# Patient Record
Sex: Male | Born: 1970 | Race: Black or African American | Hispanic: No | Marital: Married | Smoking: Never smoker
Health system: Southern US, Community
[De-identification: ages and names within clinical notes are randomized; demographics above are authoritative.]

## PROBLEM LIST (undated history)

## (undated) DIAGNOSIS — E669 Obesity, unspecified: Secondary | ICD-10-CM

## (undated) DIAGNOSIS — F419 Anxiety disorder, unspecified: Secondary | ICD-10-CM

## (undated) DIAGNOSIS — T7840XA Allergy, unspecified, initial encounter: Secondary | ICD-10-CM

## (undated) DIAGNOSIS — Z973 Presence of spectacles and contact lenses: Secondary | ICD-10-CM

## (undated) DIAGNOSIS — F329 Major depressive disorder, single episode, unspecified: Secondary | ICD-10-CM

## (undated) DIAGNOSIS — Z8669 Personal history of other diseases of the nervous system and sense organs: Secondary | ICD-10-CM

## (undated) DIAGNOSIS — Z9289 Personal history of other medical treatment: Secondary | ICD-10-CM

## (undated) DIAGNOSIS — L659 Nonscarring hair loss, unspecified: Secondary | ICD-10-CM

## (undated) DIAGNOSIS — L309 Dermatitis, unspecified: Secondary | ICD-10-CM

## (undated) DIAGNOSIS — G709 Myoneural disorder, unspecified: Secondary | ICD-10-CM

## (undated) HISTORY — PX: TONSILLECTOMY AND ADENOIDECTOMY: SUR1326

## (undated) HISTORY — DX: Dermatitis, unspecified: L30.9

## (undated) HISTORY — DX: Major depressive disorder, single episode, unspecified: F32.9

## (undated) HISTORY — DX: Nonscarring hair loss, unspecified: L65.9

## (undated) HISTORY — DX: Obesity, unspecified: E66.9

## (undated) HISTORY — DX: Personal history of other diseases of the nervous system and sense organs: Z86.69

## (undated) HISTORY — DX: Myoneural disorder, unspecified: G70.9

## (undated) HISTORY — DX: Allergy, unspecified, initial encounter: T78.40XA

## (undated) HISTORY — DX: Presence of spectacles and contact lenses: Z97.3

## (undated) HISTORY — DX: Anxiety disorder, unspecified: F41.9

## (undated) HISTORY — DX: Personal history of other medical treatment: Z92.89

## (undated) HISTORY — PX: TONSILLECTOMY: SUR1361

---

## 2002-05-17 ENCOUNTER — Encounter: Payer: Self-pay | Admitting: Emergency Medicine

## 2002-05-17 ENCOUNTER — Encounter: Admission: RE | Admit: 2002-05-17 | Discharge: 2002-05-17 | Payer: Self-pay | Admitting: Emergency Medicine

## 2002-06-14 ENCOUNTER — Ambulatory Visit (HOSPITAL_COMMUNITY): Admission: RE | Admit: 2002-06-14 | Discharge: 2002-06-14 | Payer: Self-pay | Admitting: Emergency Medicine

## 2002-06-14 ENCOUNTER — Encounter: Payer: Self-pay | Admitting: Emergency Medicine

## 2003-10-14 ENCOUNTER — Encounter: Admission: RE | Admit: 2003-10-14 | Discharge: 2003-10-14 | Payer: Self-pay | Admitting: Emergency Medicine

## 2003-10-25 ENCOUNTER — Encounter: Admission: RE | Admit: 2003-10-25 | Discharge: 2003-10-25 | Payer: Self-pay | Admitting: Emergency Medicine

## 2006-10-14 ENCOUNTER — Encounter: Admission: RE | Admit: 2006-10-14 | Discharge: 2006-10-14 | Payer: Self-pay | Admitting: Emergency Medicine

## 2007-04-28 DIAGNOSIS — Z9289 Personal history of other medical treatment: Secondary | ICD-10-CM

## 2007-04-28 HISTORY — DX: Personal history of other medical treatment: Z92.89

## 2009-07-23 ENCOUNTER — Ambulatory Visit (HOSPITAL_COMMUNITY): Admission: RE | Admit: 2009-07-23 | Discharge: 2009-07-23 | Payer: Self-pay | Admitting: Gastroenterology

## 2010-04-27 HISTORY — PX: COLONOSCOPY: SHX174

## 2010-12-05 ENCOUNTER — Emergency Department (HOSPITAL_COMMUNITY)
Admission: EM | Admit: 2010-12-05 | Discharge: 2010-12-05 | Disposition: A | Discharge status: Home / Self Care | Payer: 59 | Attending: Emergency Medicine | Admitting: Emergency Medicine

## 2010-12-05 ENCOUNTER — Emergency Department (HOSPITAL_COMMUNITY): Payer: 59

## 2010-12-05 DIAGNOSIS — E7800 Pure hypercholesterolemia, unspecified: Secondary | ICD-10-CM | POA: Insufficient documentation

## 2010-12-05 DIAGNOSIS — R059 Cough, unspecified: Secondary | ICD-10-CM | POA: Insufficient documentation

## 2010-12-05 DIAGNOSIS — R05 Cough: Secondary | ICD-10-CM | POA: Insufficient documentation

## 2010-12-05 DIAGNOSIS — R0602 Shortness of breath: Secondary | ICD-10-CM | POA: Insufficient documentation

## 2010-12-05 DIAGNOSIS — R110 Nausea: Secondary | ICD-10-CM | POA: Insufficient documentation

## 2010-12-05 DIAGNOSIS — R0789 Other chest pain: Secondary | ICD-10-CM | POA: Insufficient documentation

## 2010-12-05 LAB — URINALYSIS, ROUTINE W REFLEX MICROSCOPIC
Bilirubin Urine: NEGATIVE
Glucose, UA: NEGATIVE mg/dL
Hgb urine dipstick: NEGATIVE
Ketones, ur: NEGATIVE mg/dL
Leukocytes, UA: NEGATIVE
Nitrite: NEGATIVE
Protein, ur: NEGATIVE mg/dL
Specific Gravity, Urine: 1.008 (ref 1.005–1.030)
Urobilinogen, UA: 0.2 mg/dL (ref 0.0–1.0)
pH: 7 (ref 5.0–8.0)

## 2011-06-15 ENCOUNTER — Other Ambulatory Visit: Payer: Self-pay | Admitting: Family Medicine

## 2011-06-15 ENCOUNTER — Ambulatory Visit
Admission: RE | Admit: 2011-06-15 | Discharge: 2011-06-15 | Disposition: A | Discharge status: Home / Self Care | Payer: 59 | Source: Ambulatory Visit | Attending: Family Medicine | Admitting: Family Medicine

## 2011-06-15 DIAGNOSIS — R059 Cough, unspecified: Secondary | ICD-10-CM

## 2011-06-15 DIAGNOSIS — R05 Cough: Secondary | ICD-10-CM

## 2012-04-27 DIAGNOSIS — Z8669 Personal history of other diseases of the nervous system and sense organs: Secondary | ICD-10-CM

## 2012-04-27 HISTORY — DX: Personal history of other diseases of the nervous system and sense organs: Z86.69

## 2012-10-23 ENCOUNTER — Encounter (HOSPITAL_COMMUNITY): Payer: Self-pay | Admitting: *Deleted

## 2012-10-23 ENCOUNTER — Emergency Department (HOSPITAL_COMMUNITY): Payer: 59

## 2012-10-23 ENCOUNTER — Emergency Department (HOSPITAL_COMMUNITY)
Admission: EM | Admit: 2012-10-23 | Discharge: 2012-10-23 | Disposition: A | Discharge status: Home / Self Care | Payer: 59 | Attending: Emergency Medicine | Admitting: Emergency Medicine

## 2012-10-23 DIAGNOSIS — J029 Acute pharyngitis, unspecified: Secondary | ICD-10-CM | POA: Insufficient documentation

## 2012-10-23 DIAGNOSIS — R599 Enlarged lymph nodes, unspecified: Secondary | ICD-10-CM | POA: Insufficient documentation

## 2012-10-23 DIAGNOSIS — J209 Acute bronchitis, unspecified: Secondary | ICD-10-CM

## 2012-10-23 DIAGNOSIS — R059 Cough, unspecified: Secondary | ICD-10-CM | POA: Insufficient documentation

## 2012-10-23 DIAGNOSIS — R05 Cough: Secondary | ICD-10-CM | POA: Insufficient documentation

## 2012-10-23 DIAGNOSIS — Z8669 Personal history of other diseases of the nervous system and sense organs: Secondary | ICD-10-CM | POA: Insufficient documentation

## 2012-10-23 LAB — RAPID STREP SCREEN (MED CTR MEBANE ONLY): Streptococcus, Group A Screen (Direct): NEGATIVE

## 2012-10-23 MED ORDER — METHYLPREDNISOLONE 4 MG PO KIT: PACK | ORAL | Status: DC

## 2012-10-23 MED ORDER — GI COCKTAIL ~~LOC~~
30.0000 mL | Freq: Once | ORAL | Status: AC
  Administered 2012-10-23: 30 mL via ORAL
  Filled 2012-10-23: qty 30

## 2012-10-23 MED ORDER — ALBUTEROL SULFATE HFA 108 (90 BASE) MCG/ACT IN AERS: 2.0000 | INHALATION_SPRAY | RESPIRATORY_TRACT | Status: DC | PRN

## 2012-10-23 MED ORDER — LIDOCAINE VISCOUS 2 % MT SOLN: 15.0000 mL | OROMUCOSAL | Status: DC | PRN

## 2012-10-23 MED ORDER — AZITHROMYCIN 250 MG PO TABS: ORAL_TABLET | ORAL | Status: DC

## 2012-10-23 NOTE — ED Notes (Signed)
MD at bedside. 

## 2012-10-23 NOTE — ED Provider Notes (Signed)
History    CSN: [id number] Arrival date & time 10/23/12  2103  First MD Initiated Contact with Patient 10/23/12 2126     Chief Complaint  Patient presents with  . Sore Throat  . Wheezing  . Cough   (Consider location/radiation/quality/duration/timing/severity/associated sxs/prior Treatment) HPI  Devin Erickson is a 42 y.o. male complaining of sore throat, dry cough and wheezing but is exacerbated when supine. Patient denies fever, nausea vomiting, rhinorrhea, shortness of breath, chest pain, change in bowel or bladder habits. She was seen by primary care and given Vicodin with little relief.  Past Medical History  Diagnosis Date  . Sleep apnea    History reviewed. No pertinent past surgical history. No family history on file. History  Substance Use Topics  . Smoking status: Never Smoker   . Smokeless tobacco: Not on file  . Alcohol Use: Yes    Review of Systems  Constitutional:       Negative except as described in HPI  HENT:       Negative except as described in HPI  Respiratory:       Negative except as described in HPI  Cardiovascular:       Negative except as described in HPI  Gastrointestinal:       Negative except as described in HPI  Genitourinary:       Negative except as described in HPI  Musculoskeletal:       Negative except as described in HPI  Skin:       Negative except as described in HPI  Neurological:       Negative except as described in HPI  All other systems reviewed and are negative.    Allergies  Review of patient's allergies indicates no known allergies.  Home Medications   Current Outpatient Rx  Name  Route  Sig  Dispense  Refill  . HYDROcodone-homatropine (HYCODAN) 5-1.5 MG/5ML syrup   Oral   Take 10 mLs by mouth every 6 (six) hours as needed for cough.          BP 130/82  Pulse 78  Temp(Src) 98.1 F (36.7 C) (Oral)  Resp 18  Ht 6\' 3"  (1.905 m)  Wt 285 lb (129.275 kg)  BMI 35.62 kg/m2  SpO2 97% Physical Exam   Nursing note and vitals reviewed. Constitutional: He is oriented to person, place, and time. He appears well-developed and well-nourished. No distress.  HENT:  Head: Normocephalic.  Mouth/Throat: Oropharynx is clear and moist. No oropharyngeal exudate.  Only erythematous posterior pharynx, tonsillar hypertrophy 1+ bilaterally  Eyes: Conjunctivae and EOM are normal. Pupils are equal, round, and reactive to light.  Neck:  Tender anterior cervical lymphadenopathy  Cardiovascular: Normal rate, regular rhythm and intact distal pulses.   Pulmonary/Chest: Effort normal and breath sounds normal. No stridor. No respiratory distress. He has no wheezes. He has no rales. He exhibits no tenderness.  Abdominal: Soft. Bowel sounds are normal. He exhibits no distension and no mass. There is no tenderness. There is no rebound and no guarding.  Musculoskeletal: Normal range of motion.  Lymphadenopathy:    He has cervical adenopathy.  Neurological: He is alert and oriented to person, place, and time.  Psychiatric: He has a normal mood and affect.    ED Course  Procedures (including critical care time) Labs Reviewed  RAPID STREP SCREEN   Dg Chest 2 View  10/23/2012   *RADIOLOGY REPORT*  Clinical Data: Cough for 1 week.  CHEST - 2 VIEW  Comparison:  PA and lateral chest 06/15/2011.  Findings: Lungs are clear.  Heart size is normal.  No pneumothorax or pleural fluid.  IMPRESSION: Negative chest.   Original Report Authenticated By: Holley Dexter, M.D.   1. Acute bronchitis     MDM   Filed Vitals:   10/23/12 2116  BP: 130/82  Pulse: 78  Temp: 98.1 F (36.7 C)  TempSrc: Oral  Resp: 18  Height: 6\' 3"  (1.905 m)  Weight: 285 lb (129.275 kg)  SpO2: 97%     Devin Erickson is a 42 y.o. male thing of sore throat, cough and wheezing worsening over the course of last week. Afebrile, saturating well on room air, rapid strep and chest x-ray pending.  Chest x-ray shows no infiltrate, rapid strep is  negative, reassuring physical exam. I will treat the patient aggressively for acute bronchitis at patient's and family's request.    Medications  gi cocktail (Maalox,Lidocaine,Donnatal) (30 mLs Oral Given 10/23/12 2213)    Pt is hemodynamically stable, appropriate for, and amenable to discharge at this time. Pt verbalized understanding and agrees with care plan. Outpatient follow-up and specific return precautions discussed.    New Prescriptions   ALBUTEROL (PROVENTIL HFA;VENTOLIN HFA) 108 (90 BASE) MCG/ACT INHALER    Inhale 2 puffs into the lungs every 2 (two) hours as needed for wheezing or shortness of breath (cough).   AZITHROMYCIN (ZITHROMAX Z-PAK) 250 MG TABLET    2 po day one, then 1 daily x 4 days   LIDOCAINE (XYLOCAINE) 2 % SOLUTION    Take 15 mLs by mouth every 3 (three) hours as needed for pain.   METHYLPREDNISOLONE (MEDROL DOSEPAK) 4 MG TABLET    As directed by package insert     Wynetta Emery, PA-C 10/23/12 2309

## 2012-10-23 NOTE — ED Notes (Signed)
Pt states he feels like he can not swallow, throat feels swollen, pt states he has had a cough and wheezing as well as sore throat. Seen by PMD Thursday, given cough syrup.

## 2012-10-24 NOTE — ED Provider Notes (Signed)
Medical screening examination/treatment/procedure(s) were performed by non-physician practitioner and as supervising physician I was immediately available for consultation/collaboration.  Flint Melter, MD 10/24/12 1640

## 2012-10-25 LAB — CULTURE, GROUP A STREP

## 2012-12-02 ENCOUNTER — Ambulatory Visit (HOSPITAL_COMMUNITY)
Admission: RE | Admit: 2012-12-02 | Discharge: 2012-12-02 | Disposition: A | Discharge status: Home / Self Care | Payer: 59 | Source: Ambulatory Visit | Attending: Medical | Admitting: Medical

## 2012-12-02 ENCOUNTER — Ambulatory Visit (INDEPENDENT_AMBULATORY_CARE_PROVIDER_SITE_OTHER): Payer: 59 | Admitting: Medical

## 2012-12-02 ENCOUNTER — Other Ambulatory Visit: Payer: Self-pay | Admitting: Medical

## 2012-12-02 ENCOUNTER — Ambulatory Visit
Admission: RE | Admit: 2012-12-02 | Discharge: 2012-12-02 | Disposition: A | Discharge status: Home / Self Care | Payer: 59 | Source: Ambulatory Visit | Attending: Medical | Admitting: Medical

## 2012-12-02 ENCOUNTER — Encounter: Payer: Self-pay | Admitting: Medical

## 2012-12-02 VITALS — BP 118/78 | HR 95 | Temp 98.7°F | Resp 18 | Ht 75.0 in | Wt 293.0 lb

## 2012-12-02 DIAGNOSIS — R0781 Pleurodynia: Secondary | ICD-10-CM

## 2012-12-02 DIAGNOSIS — R0989 Other specified symptoms and signs involving the circulatory and respiratory systems: Secondary | ICD-10-CM

## 2012-12-02 DIAGNOSIS — R0600 Dyspnea, unspecified: Secondary | ICD-10-CM

## 2012-12-02 DIAGNOSIS — R0602 Shortness of breath: Secondary | ICD-10-CM

## 2012-12-02 DIAGNOSIS — R7989 Other specified abnormal findings of blood chemistry: Secondary | ICD-10-CM

## 2012-12-02 DIAGNOSIS — R0609 Other forms of dyspnea: Secondary | ICD-10-CM

## 2012-12-02 DIAGNOSIS — R071 Chest pain on breathing: Secondary | ICD-10-CM

## 2012-12-02 DIAGNOSIS — J189 Pneumonia, unspecified organism: Secondary | ICD-10-CM | POA: Insufficient documentation

## 2012-12-02 LAB — BASIC METABOLIC PANEL
BUN: 8 mg/dL (ref 6–23)
CO2: 29 mEq/L (ref 19–32)
Calcium: 9.3 mg/dL (ref 8.4–10.5)
Chloride: 106 mEq/L (ref 96–112)
Creat: 0.99 mg/dL (ref 0.50–1.35)
Glucose, Bld: 101 mg/dL — ABNORMAL HIGH (ref 70–99)
Potassium: 3.5 mEq/L (ref 3.5–5.3)
Sodium: 140 mEq/L (ref 135–145)

## 2012-12-02 LAB — CBC WITH DIFFERENTIAL/PLATELET
Basophils Absolute: 0 10*3/uL (ref 0.0–0.1)
Basophils Relative: 0 % (ref 0–1)
Eosinophils Absolute: 1.2 10*3/uL — ABNORMAL HIGH (ref 0.0–0.7)
Eosinophils Relative: 15 % — ABNORMAL HIGH (ref 0–5)
HCT: 38.5 % — ABNORMAL LOW (ref 39.0–52.0)
Hemoglobin: 13.8 g/dL (ref 13.0–17.0)
Lymphocytes Relative: 21 % (ref 12–46)
Lymphs Abs: 1.7 10*3/uL (ref 0.7–4.0)
MCH: 24.7 pg — ABNORMAL LOW (ref 26.0–34.0)
MCHC: 35.8 g/dL (ref 30.0–36.0)
MCV: 68.9 fL — ABNORMAL LOW (ref 78.0–100.0)
Monocytes Absolute: 0.7 10*3/uL (ref 0.1–1.0)
Monocytes Relative: 9 % (ref 3–12)
Neutro Abs: 4.5 10*3/uL (ref 1.7–7.7)
Neutrophils Relative %: 55 % (ref 43–77)
Platelets: 185 10*3/uL (ref 150–400)
RBC: 5.59 MIL/uL (ref 4.22–5.81)
RDW: 17.2 % — ABNORMAL HIGH (ref 11.5–15.5)
WBC: 8.1 10*3/uL (ref 4.0–10.5)

## 2012-12-02 LAB — D-DIMER, QUANTITATIVE: D-Dimer, Quant: 1.39 ug/mL-FEU — ABNORMAL HIGH (ref 0.00–0.48)

## 2012-12-02 LAB — BRAIN NATRIURETIC PEPTIDE: Brain Natriuretic Peptide: <2 pg/mL (ref 0.0–100.0)

## 2012-12-02 MED ORDER — METHYLPREDNISOLONE 4 MG PO KIT: PACK | ORAL | Status: DC

## 2012-12-02 MED ORDER — LEVOFLOXACIN 500 MG PO TABS: 500.0000 mg | ORAL_TABLET | Freq: Every day | ORAL | Status: DC

## 2012-12-02 MED ORDER — ALBUTEROL SULFATE HFA 108 (90 BASE) MCG/ACT IN AERS: 2.0000 | INHALATION_SPRAY | RESPIRATORY_TRACT | Status: DC | PRN

## 2012-12-02 MED ORDER — IOHEXOL 350 MG/ML SOLN
100.0000 mL | Freq: Once | INTRAVENOUS | Status: AC | PRN
  Administered 2012-12-02: 100 mL via INTRAVENOUS

## 2012-12-02 NOTE — Progress Notes (Signed)
Subjective:  Devin Erickson is a 42 y.o. male new patient accompanied by wife and son who presents for possible bronchitis or pneumonia.  He notes having frequent respiratory infections and a few other episodes of bronchitis/pneumonia in the last several months.  Current symptoms include wheezing, dyspnea, hurts to breath, pain with breathing and coughing, coughing non stop.  Thinks he pulled muscle in chest due to coughing.   Feels feverish last few nights.   Has had sweats, chills, nonproductive cough though.  Feels heavy chest congestion.  Feels like water in chest.   He does note sore throat, head pressure, headache.  Denies runny nose, ear pain.  +nausea.  Denies vomiting and diarrhea.  Treatment to date:OTC Tussin.   Not sleeping well due to cough.  No sick contacts.  Non smoker.   No hx/o asthma or COPD.  He notes 2-3 respiratory infections in the last year.  Thinks he is getting frequent respiratory infections.  No hx/o heart disease in self or family. No recent trauma, surgery, no hx/o DVT/PE, no recent stasis or long travel.  Incidentally, he does report some ache in left calve.  No other aggravating or relieving factors.  No other c/o.  The following portions of the patient's history were reviewed and updated as appropriate: allergies, current medications, past family history, past medical history, past social history, past surgical history and problem list.  Past Medical History  Diagnosis Date  . Sleep apnea   . Thalassemia minor   . Wears glasses   . Eczema   . Hair loss   . Bipolar disorder   . Depression   . Anxiety     Objective: Vital signs reviewed  General appearance: Alert, WD/WN, no distress                             Skin: warm, no rash, no diaphoresis                           Head: +frontal and maxillary sinus tenderness                            Eyes: conjunctiva normal, corneas clear, PERRLA                            Ears: pearly TMs, external ear canals  normal                          Nose: septum midline, turbinates without erythema and no discharge             Mouth/throat: MMM, tongue normal,no pharyngeal erythema                           Neck: supple, no adenopathy, no thyromegaly, nontender, no JVD                          Heart: RRR, normal S1, S2, no murmurs                         Lungs: audible faint wheezes, patient is noting pain causing him to not take good breaths, so lung sounds seem clear but effort less than desirable, no  obvious rales                Extremities: no edema, nontender Legs: mild tenderness left upper calve, no obvious erythema, warmth, or cord.     Adult ECG Report  Indication: SOB, pleuritic CP  Rate: 90 bpm  Rhythm: normal sinus rhythm  QRS Axis: 75 degrees  PR Interval:  QRS Duration:  QTc:  Conduction Disturbances: none  Other Abnormalities: none  Patient's cardiac risk factors are: male gender.  EKG comparison: none  Narrative Interpretation: normal EKG    Assessment and Plan: Encounter Diagnoses  Name Primary?  . SOB (shortness of breath) Yes  . Dyspnea   . Pleuritic chest pain    Possible bronchitis, but etiology unclear.   Discussed possible causes, possible next steps pending findings.  EKG reviewed, will send for CXR, stat labs sent.   F/u pending call back on studies, if worse in the meantime, go to the ED.

## 2012-12-05 ENCOUNTER — Telehealth: Payer: Self-pay | Admitting: Medical

## 2012-12-05 ENCOUNTER — Other Ambulatory Visit: Payer: Self-pay | Admitting: Medical

## 2012-12-05 ENCOUNTER — Ambulatory Visit: Payer: Self-pay | Admitting: Medical

## 2012-12-05 MED ORDER — HYDROCODONE-HOMATROPINE 5-1.5 MG/5ML PO SYRP: 5.0000 mL | ORAL_SOLUTION | Freq: Four times a day (QID) | ORAL | Status: DC | PRN

## 2012-12-05 NOTE — Telephone Encounter (Signed)
1) fax/write work note for illness.  Can cover him Friday, Monday and tomorrow.  Have him f/u tomorrow. 2) take the prednisone in the morning.  If the cough is keeping him up, then I can call out cough syrup that will help with sleep.   Let me know

## 2012-12-05 NOTE — Telephone Encounter (Signed)
Patient says if you could please send him in something for his cough to his pharmacy and he will drop off paper work from his job to be filled out. CLS

## 2012-12-05 NOTE — Progress Notes (Signed)
I called out Hycodan to the patients pharmacy per Crosby Oyster PA-C. CLS

## 2012-12-05 NOTE — Telephone Encounter (Signed)
Please call  Questions about medication, He is not able to sleep. (Did advise him that can be a side effect of the prednisone especially if he is taking later in the day)  Otherwise he is not coughing as much and that is good but he CAN NOT sleep Also, his work requires FMLA forms to be completed anytime he is out of work so he will drop those off

## 2012-12-12 ENCOUNTER — Telehealth: Payer: Self-pay | Admitting: Internal Medicine

## 2012-12-12 ENCOUNTER — Ambulatory Visit (INDEPENDENT_AMBULATORY_CARE_PROVIDER_SITE_OTHER): Payer: 59 | Admitting: Family Medicine

## 2012-12-12 ENCOUNTER — Telehealth: Payer: Self-pay | Admitting: Family Medicine

## 2012-12-12 ENCOUNTER — Encounter: Payer: Self-pay | Admitting: Family Medicine

## 2012-12-12 VITALS — BP 132/86 | HR 92 | Temp 98.5°F | Ht 75.0 in | Wt 293.0 lb

## 2012-12-12 DIAGNOSIS — R059 Cough, unspecified: Secondary | ICD-10-CM

## 2012-12-12 DIAGNOSIS — J189 Pneumonia, unspecified organism: Secondary | ICD-10-CM

## 2012-12-12 DIAGNOSIS — R062 Wheezing: Secondary | ICD-10-CM

## 2012-12-12 DIAGNOSIS — R05 Cough: Secondary | ICD-10-CM

## 2012-12-12 MED ORDER — PREDNISONE 20 MG PO TABS: 20.0000 mg | ORAL_TABLET | Freq: Two times a day (BID) | ORAL | Status: DC

## 2012-12-12 MED ORDER — ALBUTEROL SULFATE HFA 108 (90 BASE) MCG/ACT IN AERS: 2.0000 | INHALATION_SPRAY | RESPIRATORY_TRACT | Status: DC | PRN

## 2012-12-12 MED ORDER — AZITHROMYCIN 250 MG PO TABS
ORAL_TABLET | ORAL | Status: DC
Start: 2012-12-12 — End: 2012-12-19

## 2012-12-12 MED ORDER — BENZONATATE 200 MG PO CAPS: 200.0000 mg | ORAL_CAPSULE | Freq: Three times a day (TID) | ORAL | Status: DC | PRN

## 2012-12-12 NOTE — Telephone Encounter (Signed)
Message copied by Janeice Robinson on Mon Dec 12, 2012 10:15 AM ------      Message from: Jac Canavan      Created: Fri Dec 09, 2012  5:24 PM       Needs f/u visit recheck on breathing, symptoms . Can't complete his FMLA without knowing all dates he missed, and without recheck to know next steps. ------

## 2012-12-12 NOTE — Progress Notes (Signed)
Chief Complaint  Patient presents with  . Follow-up    came in and saw Shane 12/02/12, finished all meds. Still SOB, coughing-worse at night.    Patient presents with ongoing cough, wheezing.  He was seen 8/8 with cough, wheeze, shortness of breath.  He had been having fevers at night, never had discolored phlegm.  He feels like he got a little better, but hasn't continued to improve.  He feels like he is still wheezing, but "not as loud as it was".  His shortness of breath is "much better". He was treated with medrol dosepak, levaquin x 1 week. He reports he is still having tactile fevers at night, wakes up drenched, shivering, just in the mornings.  No problems during the day with fever or chills.  He talks on the phone all day long at work, getting somewhat short of breath when speaking in long sentences, as well as very fatigued.  Inhaler hasn't been helping in the past couple of days, but he thinks it may have run out. He is very concerned about why he keeps getting sick, as apparently he also had bronchitis this year.  Denies runny nose, head congestion, postnasal drainage.  Having dry coughing spells.  Last night coughed for 1.5 hours.  Almost vomited from coughing.  Still coughed even with the hycodan, maybe stopped coughing 45 minutes after taking it.  Past Medical History  Diagnosis Date  . Sleep apnea   . Thalassemia minor   . Wears glasses   . Eczema   . Hair loss   . Bipolar disorder   . Depression   . Anxiety    No past surgical history on file. History   Social History  . Marital Status: Married    Spouse Name: N/A    Number of Children: N/A  . Years of Education: N/A   Occupational History  . Not on file.   Social History Main Topics  . Smoking status: Never Smoker   . Smokeless tobacco: Not on file  . Alcohol Use: No     Comment: few beers a week.maybe 3-4  . Drug Use: No  . Sexual Activity: Not on file   Other Topics Concern  . Not on file   Social History  Narrative  . No narrative on file    Current outpatient prescriptions:albuterol (PROVENTIL HFA;VENTOLIN HFA) 108 (90 BASE) MCG/ACT inhaler, Inhale 2 puffs into the lungs every 2 (two) hours as needed for wheezing or shortness of breath (cough)., Disp: 1 Inhaler, Rfl: 0;  HYDROcodone-homatropine (HYCODAN) 5-1.5 MG/5ML syrup, Take 5 mLs by mouth every 6 (six) hours as needed for cough., Disp: 120 mL, Rfl: 0 lidocaine (XYLOCAINE) 2 % solution, Take 15 mLs by mouth every 3 (three) hours as needed for pain., Disp: 100 mL, Rfl: 0  No Known Allergies  ROS:  Denies nausea, vomiting (almost had post-tussive emesis on one occasion).  Some decrease in appetite, +overall fatigue.  Denies any diarrhea. Having headaches due to the coughing spells. No chest pain, palpitations, or other complaints except as per HPI.  PHYSICAL EXAM: BP 132/86  Pulse 92  Temp(Src) 98.5 F (36.9 C) (Oral)  Ht 6\' 3"  (1.905 m)  Wt 293 lb (132.904 kg)  BMI 36.62 kg/m2  SpO2 96% Well developed, pleasant male, accompanied by his wife and child, in no distress.  He is speaking easily in full sentences.  No coughing noted during visit. HEENT:  PERRL, EOMI, conjunctiva clear.  TM's and EAC's normal. OP and  nasal mucosa clear.  Sinuses nontender Neck:no lymphadenopathy, thyromegaly or mass Heart: regular rate and rhythm without murmur Lungs: Diffuse mild wheezing.  No rales or ronchi.  Air movement improved some after nebulizer treatment, but still had some mild diffuse wheezing. Extremities: no edema Skin: no rash Neuro: alert and oriented.  Cranial nerves intact. Normal gait, strength.  Review of records/data from recent visit: CXR 8/8 normal. CT 8/8:   1. No evidence of central pulmonary embolus; evaluation  significantly suboptimal due to limitations in the timing of the  contrast bolus.  2. Mild left lower lobe pneumonia noted.  ASSESSMENT/PLAN:  Pneumonia - Plan: azithromycin (ZITHROMAX) 250 MG tablet, PR ALBUTEROL  IPRATROP NON-COMP, PR INHAL RX, AIRWAY OBST/DX SPUTUM INDUCT  Wheezing - Plan: predniSONE (DELTASONE) 20 MG tablet, albuterol (PROVENTIL HFA;VENTOLIN HFA) 108 (90 BASE) MCG/ACT inhaler, PR ALBUTEROL IPRATROP NON-COMP, PR INHAL RX, AIRWAY OBST/DX SPUTUM INDUCT  Cough - Plan: benzonatate (TESSALON) 200 MG capsule, PR ALBUTEROL IPRATROP NON-COMP, PR INHAL RX, AIRWAY OBST/DX SPUTUM INDUCT  Given that cough is nonproductive, that he continues to wheeze, will change to Z-pak (vs extending levaquin course).  Refilled albuterol--he had some benefit to nebulizer treatment in office, likely was out of albuterol in inhaler.  Will give additional steroids.  Risks and side effects of meds reviewed.  Will treat with benzonatate given his lack of response to hydrocodone, and that his coughing spells also occur during day--he can take this med during day, and work/drive.  He was asked to return in 1 week for recheck.  He was mentioning FMLA forms--which were not attached to his paperwork today/not available to me.  He was asked to write down which days of work he missed last week, and provide that info to [place], and to f/u with Vincenza Hews next week.  We can definitely put down the dates of visits to the office, and Vincenza Hews can put down whatever recommendations he had given to pt as far as how long he needs to remain out of work.  I do not feel at this point that he needs to remain out of work.  He was speaking comfortably throughout the visit.    I tried to address his concerns regarding why he isn't feeling better,frustration about getting sick yet again after a bout of bronchitis.  We discussed that unlike bronchitis, the fatigue and decrease in energy can take up to a month to return after a pneumonia.  F/u with Vincenza Hews in 1 week, sooner prn worsening shortness of breath.

## 2012-12-12 NOTE — Patient Instructions (Signed)
Take the medications as directed. The steroids are twice daily for 5 days. The antibiotics are two today, then once daily for the next 4 days.  You only take it for 5 days, but it continues to work for another 5 days. The benzonatate is a cough medication that you can use during the day, shouldn't make you tired. I also recommend trying Mucinex (plain or the DM version) for your cough, especially if having chest congestion and/or thick phlegm.  Return in 1 week for recheck.  Check your temperature--it will help [place] know if you are getting better or worse  As discussed, with a diagnosis of pneumonia, it can sometimes take a full month to fully get your energy back.  But your symptoms of cough, shortness of breath should be improving with the antibiotics in the next week.  Pneumonia, Adult Pneumonia is an infection of the lungs.  CAUSES Pneumonia may be caused by bacteria or a virus. Usually, these infections are caused by breathing infectious particles into the lungs (respiratory tract). SYMPTOMS   Cough.  Fever.  Chest pain.  Increased rate of breathing.  Wheezing.  Mucus production. DIAGNOSIS  If you have the common symptoms of pneumonia, your caregiver will typically confirm the diagnosis with a chest X-ray. The X-ray will show an abnormality in the lung (pulmonary infiltrate) if you have pneumonia. Other tests of your blood, urine, or sputum may be done to find the specific cause of your pneumonia. Your caregiver may also do tests (blood gases or pulse oximetry) to see how well your lungs are working. TREATMENT  Some forms of pneumonia may be spread to other people when you cough or sneeze. You may be asked to wear a mask before and during your exam. Pneumonia that is caused by bacteria is treated with antibiotic medicine. Pneumonia that is caused by the influenza virus may be treated with an antiviral medicine. Most other viral infections must run their course. These infections  will not respond to antibiotics.  PREVENTION A pneumococcal shot (vaccine) is available to prevent a common bacterial cause of pneumonia. This is usually suggested for:  People over 59 years old.  Patients on chemotherapy.  People with chronic lung problems, such as bronchitis or emphysema.  People with immune system problems. If you are over 65 or have a high risk condition, you may receive the pneumococcal vaccine if you have not received it before. In some countries, a routine influenza vaccine is also recommended. This vaccine can help prevent some cases of pneumonia.You may be offered the influenza vaccine as part of your care. If you smoke, it is time to quit. You may receive instructions on how to stop smoking. Your caregiver can provide medicines and counseling to help you quit. HOME CARE INSTRUCTIONS   Cough suppressants may be used if you are losing too much rest. However, coughing protects you by clearing your lungs. You should avoid using cough suppressants if you can.  Your caregiver may have prescribed medicine if he or she thinks your pneumonia is caused by a bacteria or influenza. Finish your medicine even if you start to feel better.  Your caregiver may also prescribe an expectorant. This loosens the mucus to be coughed up.  Only take over-the-counter or prescription medicines for pain, discomfort, or fever as directed by your caregiver.  Do not smoke. Smoking is a common cause of bronchitis and can contribute to pneumonia. If you are a smoker and continue to smoke, your cough may last  several weeks after your pneumonia has cleared.  A cold steam vaporizer or humidifier in your room or home may help loosen mucus.  Coughing is often worse at night. Sleeping in a semi-upright position in a recliner or using a couple pillows under your head will help with this.  Get rest as you feel it is needed. Your body will usually let you know when you need to rest. SEEK IMMEDIATE  MEDICAL CARE IF:   Your illness becomes worse. This is especially true if you are elderly or weakened from any other disease.  You cannot control your cough with suppressants and are losing sleep.  You begin coughing up blood.  You develop pain which is getting worse or is uncontrolled with medicines.  You have a fever.  Any of the symptoms which initially brought you in for treatment are getting worse rather than better.  You develop shortness of breath or chest pain. MAKE SURE YOU:   Understand these instructions.  Will watch your condition.  Will get help right away if you are not doing well or get worse. Document Released: 04/13/2005 Document Revised: 07/06/2011 Document Reviewed: 07/03/2010 [facility] Patient Information 2014 [place], [place].

## 2012-12-12 NOTE — Telephone Encounter (Signed)
I called to speak the patient about his FMLA forms that need to be filled out but he would need an OV first before forms can be complete. THe patient states that he is not breathing that well and he needs an OV today so I placed him on the schedule to see Dr. Lynelle Doctor. CLS

## 2012-12-12 NOTE — Telephone Encounter (Signed)
Pt states he was out august 11 and 12th and then today the 18th and then will take a 1/2 day on the 25th to come back in here. This is the dates for his FMLA

## 2012-12-13 ENCOUNTER — Encounter: Payer: Self-pay | Admitting: Family Medicine

## 2012-12-13 NOTE — Telephone Encounter (Signed)
JUST AN FYI WANTED YOU TO SEE THIS BEFORE TOMORROW CHANDRA IS SUPPOSE TO BE HERE THEN BECAUSE I CANT FIND THE FORM

## 2012-12-13 NOTE — Telephone Encounter (Signed)
FMLA IS IN [employer]

## 2012-12-13 NOTE — Telephone Encounter (Signed)
FYI - pls note that I am off this week.  His FMLA form is in Chandra's work area.  He saw Dr.Knapp yesterday in followup.   Its possible I may be in this week, but can't guarantee I'll get to [facility] until Monday unless Dr. Lynelle Doctor wants to finish the forms.

## 2012-12-16 NOTE — Telephone Encounter (Signed)
I put his forms back on your desk with a note from me. The real question is whether this is a condition that warrants FMLA vs just a note for work.  Pneumonia isn't a chronic condition, but will require follow-up visits and some missed work early on.  Let's talk about it Monday (you are seeing him in f/u on Monday).

## 2012-12-19 ENCOUNTER — Ambulatory Visit (INDEPENDENT_AMBULATORY_CARE_PROVIDER_SITE_OTHER): Payer: 59 | Admitting: Medical

## 2012-12-19 ENCOUNTER — Encounter: Payer: Self-pay | Admitting: Medical

## 2012-12-19 VITALS — BP 122/80 | HR 83 | Temp 98.3°F | Resp 16 | Wt 293.0 lb

## 2012-12-19 DIAGNOSIS — J189 Pneumonia, unspecified organism: Secondary | ICD-10-CM

## 2012-12-19 DIAGNOSIS — R0602 Shortness of breath: Secondary | ICD-10-CM

## 2012-12-19 NOTE — Progress Notes (Signed)
Subjective: Here for f/u on pneumonia, SOB.   Saw Dr. Lynelle Doctor here 12/12/12 in follow up but was still wheezing, coughing and SOB.  completed the second round of antibiotic and steroid.  Overall feeling better.  Still getting some wheezing, slight at bedtime.   Sleeps on his side, but elevated on 2 pillows.   Denies frank dyspnea supine though.   Mostly improved, but not back to 100%.  He denies smoking history, but does report significant seasonal allergies.   No hx/o lung disease.  No hx/o asthma.      Objective: BP 122/80  Pulse 83  Temp(Src) 98.3 F (36.8 C) (Oral)  Resp 16  Wt 293 lb (132.904 kg)  BMI 36.62 kg/m2 Gen: WD, WN, nad.  He is speaking easily in full sentences.  No coughing or wheezing noted during visit. HEENT:  PERRL, EOMI, conjunctiva clear.  TM's and EAC's normal. OP and nasal mucosa clear.  Sinuses nontender Neck:no lymphadenopathy, thyromegaly or mass Heart: regular rate and rhythm without murmur Lungs: clear today Extremities: no edema Skin: no rash  Assessment: Encounter Diagnoses  Name Primary?  . Pneumonia Yes  . Shortness of breath    Plan: Mostly improved.   I advised he c/t albuterol inhaler QHS or up to BID the next week, then go to prn use.  He has finished steroid and antibiotics.   Advised that with pneumonia, residual symptoms can hang around.  No other suspected etiology at this time.   Advised if still SOB in 2 weeks, lets get PFTs.  Reviewed the recent labs, CT chest, Xray.  He will return soon for physical.  discussed the need to be aggressive with allergy regimen in spring and fall.

## 2012-12-19 NOTE — Telephone Encounter (Signed)
See other msg

## 2012-12-21 ENCOUNTER — Telehealth: Payer: Self-pay | Admitting: Medical

## 2012-12-21 NOTE — Telephone Encounter (Signed)
I would start with work note to be sent to him or to HR.  If they have an issue with this, then we need to briefly make them aware that this is improper use of FMLA and the note should suffice.   I have seen a big increase in [employer] FMLA requests of late

## 2012-12-21 NOTE — Telephone Encounter (Signed)
Message copied by Ruffin Frederick on Wed Dec 21, 2012  3:14 PM ------      Message from: Jac Canavan      Created: Mon Dec 19, 2012  6:29 PM       Haynes Kerns, please write note/letter for his work for days missed for the recent illness.  See prior msg regarding the 3 dates missed.   He was seen and treated for pneumonia.   This doesn't qualify for FMLA.  I do have his FMLA forms, but don't think this is an appropriate use of the FMLA forms.  ------

## 2012-12-22 NOTE — Telephone Encounter (Signed)
WIFE given note for pt to be out of work & instructions that if HR won't accept to call us back with a name & # of who he talked to

## 2012-12-29 ENCOUNTER — Telehealth: Payer: Self-pay | Admitting: Family Medicine

## 2012-12-29 NOTE — Telephone Encounter (Signed)
I fax over the patients FMLA forms to the number on the forms. CLS

## 2012-12-29 NOTE — Telephone Encounter (Signed)
Message copied by SCALES, Mikki Santee on Thu Dec 29, 2012  4:12 PM ------      Message from: Jac Canavan      Created: Wed Dec 28, 2012  8:24 PM       I went ahead and completed the FMLA.  pls finish the clinic info and fax back ------

## 2013-01-16 ENCOUNTER — Telehealth: Payer: Self-pay | Admitting: Medical

## 2013-01-16 NOTE — Telephone Encounter (Signed)
lm

## 2013-01-20 ENCOUNTER — Telehealth: Payer: Self-pay | Admitting: Medical

## 2013-01-24 ENCOUNTER — Encounter: Payer: Self-pay | Admitting: Medical

## 2013-01-24 ENCOUNTER — Ambulatory Visit (INDEPENDENT_AMBULATORY_CARE_PROVIDER_SITE_OTHER): Payer: 59 | Admitting: Medical

## 2013-01-24 VITALS — BP 102/80 | HR 78 | Temp 98.3°F | Resp 16 | Wt 291.0 lb

## 2013-01-24 DIAGNOSIS — R05 Cough: Secondary | ICD-10-CM

## 2013-01-24 DIAGNOSIS — R0602 Shortness of breath: Secondary | ICD-10-CM

## 2013-01-24 DIAGNOSIS — L659 Nonscarring hair loss, unspecified: Secondary | ICD-10-CM

## 2013-01-24 DIAGNOSIS — R059 Cough, unspecified: Secondary | ICD-10-CM

## 2013-01-24 DIAGNOSIS — R062 Wheezing: Secondary | ICD-10-CM

## 2013-01-24 DIAGNOSIS — J40 Bronchitis, not specified as acute or chronic: Secondary | ICD-10-CM

## 2013-01-24 MED ORDER — MOMETASONE FUROATE 220 MCG/INH IN AEPB: 2.0000 | INHALATION_SPRAY | Freq: Every day | RESPIRATORY_TRACT | Status: DC

## 2013-01-24 MED ORDER — HYDROCOD POLST-CHLORPHEN POLST 10-8 MG/5ML PO LQCR: 5.0000 mL | Freq: Two times a day (BID) | ORAL | Status: DC

## 2013-01-24 NOTE — Progress Notes (Signed)
Subjective: Here for possible bronchitis. Here with wife and son. Having coughing, fatigue, wheezing, SOB with walking.  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  Has had some wheezing at night.  Sunday night and afternoon, coughing.  Non productive cough. Denies runny nose, sneezing, no head congestion.  Having headache from all the coughing.  Had some vertigo last week.  Has had some body aches.  Denies fever, chills, no NVD, no leg swelling, no chest pain, but feels like someone sitting on his chest.  No sick contacts. Using OTC Robitussin or Mucinex.  Hx/o nonsmoker . He does have hx/o allergies and hx/o sinus infection.    Past Medical History  Diagnosis Date  . Sleep apnea   . Thalassemia minor   . Wears glasses   . Eczema   . Hair loss   . Bipolar disorder   . Depression   . Anxiety    ROS as in subjective   Objective: Filed Vitals:   01/24/13 1339  BP: 102/80  Pulse: 78  Temp: 98.3 F (36.8 C)  Resp: 16    General appearance: alert, no distress, WD/WN HEENT: normocephalic, sclerae anicteric, TMs pearly, nares with dry mucoid discharge and mild erythema, pharynx normal Oral cavity: MMM, no lesions Neck: supple, no lymphadenopathy, no thyromegaly, no masses Heart: RRR, normal S1, S2, no murmurs Lungs: few faint wheezes, otherwise no rhonchi, or rales Pulses: 2+ symmetric, upper and lower extremities, normal cap refill Ext: no edema, no cyanosis or clubbing   Assessment: Encounter Diagnoses  Name Primary?  . Wheezing Yes  . Cough   . Shortness of breath   . Bronchitis   . Hair loss      Plan: Given recurrent bronchitis picture, and given labs a month ago with elevated eosinophils and clinical picture, I suspect asthmatic or allergic bronchitis.  Will set up PFTs.  Begin asmanex, gave demonstration on proper use, c/t Proair prn, tussionex prn, begin zyrtec.  Advised he stop/no use inhalers or zyrtec 3 days prior to PFT test. TSH for hair loss concern.  Follow-up pending  studies

## 2013-01-24 NOTE — Patient Instructions (Signed)
Begin Asmanex inhaler 1 inhalation twice daily.  Rinse mouth out with water after each use.  Use ProAir inhaler as needed, 2 puffs every 4-6 hours.  Begin Allegra or Zyrtec daily at bedtime.  Use the Tussionex cough syrup every 12 hours as needed.   We will follow up pending labs, PFTs.  Don't use the inhalers or zyrtec or allergy pill within 3 days of the PFTs.

## 2013-01-25 LAB — TSH: TSH: 0.641 u[IU]/mL (ref 0.350–4.500)

## 2013-01-27 ENCOUNTER — Other Ambulatory Visit: Payer: Self-pay | Admitting: Medical

## 2013-01-27 ENCOUNTER — Telehealth: Payer: Self-pay | Admitting: Medical

## 2013-01-27 MED ORDER — PREDNISONE 10 MG PO TABS: ORAL_TABLET | ORAL | Status: DC

## 2013-01-27 NOTE — Telephone Encounter (Signed)
FMLA paperwork that was dropped off by his wife for you to fill out was actually the wrong forms, and you don't need to fill those out, he will be bringing the new forms to fill out by [facility]. Also the cough med. That was prescribed to him is not working.  He is breathing easier but the coughing is still pretty bad, it hasn't gotten worse, but also is not better. He wanted to see if something else could be called in to control his coughing. He uses [facility] on [address].

## 2013-01-27 NOTE — Telephone Encounter (Signed)
I already filled out FMLA forms the last time.  Please pull these and make sure employer got these.   I am not going to keep on revising or redoing FMLAs.  There are a pain in the butt to begin with, and I already completed this and we faxed them in once.

## 2013-01-30 ENCOUNTER — Telehealth: Payer: Self-pay | Admitting: Medical

## 2013-01-30 NOTE — Telephone Encounter (Signed)
Pt's wife stopped by and dropped off fmla forms. She states that some things where incomplete. Please review and finish/correct. Pt states needs done asap.

## 2013-01-30 NOTE — Telephone Encounter (Signed)
Shane, I filled out what I needed to. CLS

## 2013-02-01 ENCOUNTER — Telehealth: Payer: Self-pay | Admitting: Medical

## 2013-02-01 ENCOUNTER — Encounter: Payer: Self-pay | Admitting: Medical

## 2013-02-01 NOTE — Telephone Encounter (Signed)
[employer] advised that for the claim to be approved, one of the best fitting catagories must be checked.  And it can't be Absence + Treatment or Multiple Treatments due to must have 3 consecutive days.  Also they need a letter from our office stating that it was not pt's fault that the FMLA is late.

## 2013-02-02 ENCOUNTER — Telehealth: Payer: Self-pay | Admitting: Medical

## 2013-02-02 NOTE — Telephone Encounter (Signed)
Originals in chart, copy in folder for pt pick up

## 2013-02-02 NOTE — Telephone Encounter (Signed)
lm

## 2013-02-17 ENCOUNTER — Ambulatory Visit (INDEPENDENT_AMBULATORY_CARE_PROVIDER_SITE_OTHER): Payer: 59 | Admitting: Internal Medicine

## 2013-02-17 DIAGNOSIS — R059 Cough, unspecified: Secondary | ICD-10-CM

## 2013-02-17 DIAGNOSIS — R05 Cough: Secondary | ICD-10-CM

## 2013-02-17 LAB — PULMONARY FUNCTION TEST

## 2013-02-17 NOTE — Progress Notes (Signed)
PFT Done. Jennifer Castillo, CMA  

## 2013-02-23 ENCOUNTER — Telehealth: Payer: Self-pay | Admitting: Medical

## 2013-02-23 NOTE — Telephone Encounter (Signed)
PFTs are mostly normal.  Is he seeing improvement with Asmanaex?  Has he started back on this and zyrtec?  What is his current breathing status?

## 2013-02-24 NOTE — Telephone Encounter (Signed)
Patient is aware of his PFT results. He states that the Asmanex or Zyrtec is not working. He said that his breathing is effecting his sleep. He said he has an appointment with you next week to discuss all of this. CLS

## 2013-02-24 NOTE — Telephone Encounter (Signed)
F/u as planned with OV recheck

## 2013-03-06 ENCOUNTER — Encounter: Payer: Self-pay | Admitting: Medical

## 2013-03-10 ENCOUNTER — Encounter: Payer: Self-pay | Admitting: Medical

## 2013-03-10 ENCOUNTER — Ambulatory Visit (INDEPENDENT_AMBULATORY_CARE_PROVIDER_SITE_OTHER): Payer: 59 | Admitting: Medical

## 2013-03-10 ENCOUNTER — Telehealth: Payer: Self-pay | Admitting: Family Medicine

## 2013-03-10 VITALS — BP 98/70 | HR 68 | Temp 98.3°F | Resp 16 | Ht 75.2 in | Wt 288.0 lb

## 2013-03-10 DIAGNOSIS — L658 Other specified nonscarring hair loss: Secondary | ICD-10-CM

## 2013-03-10 DIAGNOSIS — R0683 Snoring: Secondary | ICD-10-CM

## 2013-03-10 DIAGNOSIS — Z23 Encounter for immunization: Secondary | ICD-10-CM

## 2013-03-10 DIAGNOSIS — R0602 Shortness of breath: Secondary | ICD-10-CM

## 2013-03-10 DIAGNOSIS — Z125 Encounter for screening for malignant neoplasm of prostate: Secondary | ICD-10-CM

## 2013-03-10 DIAGNOSIS — Z0000 Encounter for general adult medical examination without abnormal findings: Secondary | ICD-10-CM

## 2013-03-10 DIAGNOSIS — R0609 Other forms of dyspnea: Secondary | ICD-10-CM

## 2013-03-10 DIAGNOSIS — G479 Sleep disorder, unspecified: Secondary | ICD-10-CM

## 2013-03-10 DIAGNOSIS — L631 Alopecia universalis: Secondary | ICD-10-CM

## 2013-03-10 DIAGNOSIS — R0989 Other specified symptoms and signs involving the circulatory and respiratory systems: Secondary | ICD-10-CM

## 2013-03-10 DIAGNOSIS — E669 Obesity, unspecified: Secondary | ICD-10-CM

## 2013-03-10 LAB — HEMOGLOBIN A1C
Hgb A1c MFr Bld: 5.2 % (ref <5.7)
Mean Plasma Glucose: 103 mg/dL (ref <117)

## 2013-03-10 LAB — RPR

## 2013-03-10 LAB — SEDIMENTATION RATE: Sed Rate: 1 mm/hr (ref 0–16)

## 2013-03-10 LAB — POCT URINALYSIS DIPSTICK
Bilirubin, UA: NEGATIVE
Blood, UA: NEGATIVE
Glucose, UA: NEGATIVE
Ketones, UA: NEGATIVE
Leukocytes, UA: NEGATIVE
Nitrite, UA: NEGATIVE
Protein, UA: NEGATIVE
Spec Grav, UA: 1.02
Urobilinogen, UA: NEGATIVE
pH, UA: 6

## 2013-03-10 LAB — HEPATIC FUNCTION PANEL
ALT: 17 U/L (ref 0–53)
AST: 25 U/L (ref 0–37)
Albumin: 4.3 g/dL (ref 3.5–5.2)
Alkaline Phosphatase: 42 U/L (ref 39–117)
Bilirubin, Direct: 0.2 mg/dL (ref 0.0–0.3)
Indirect Bilirubin: 0.7 mg/dL (ref 0.0–0.9)
Total Bilirubin: 0.9 mg/dL (ref 0.3–1.2)
Total Protein: 7 g/dL (ref 6.0–8.3)

## 2013-03-10 LAB — LIPID PANEL
Cholesterol: 236 mg/dL — ABNORMAL HIGH (ref 0–200)
HDL: 37 mg/dL — ABNORMAL LOW (ref >39)
LDL Cholesterol: 169 mg/dL — ABNORMAL HIGH (ref 0–99)
Total CHOL/HDL Ratio: 6.4 Ratio
Triglycerides: 148 mg/dL (ref <150)
VLDL: 30 mg/dL (ref 0–40)

## 2013-03-10 NOTE — Telephone Encounter (Signed)
I fax all of the patients information to the home sleep study [facility] and they will call him to set the mail order up. CLS

## 2013-03-10 NOTE — Progress Notes (Signed)
Subjective:   HPI  Devin Erickson is a 42 y.o. male who presents for a complete physical.   Preventative care: Last ophthalmology visit:YES-UNSURE OF THE NAME Last dental visit:YES-UNSURE OF THE NAME Last colonoscopy: 2.5 years ago with [facility], normal per pt Last prostate exam:2014  Last EKG:12/02/12, stress test a few years ago with [facility], normal per pt Last labs:2014  Prior vaccinations: TD or Tdap:2013 Influenza:03/10/13 Pneumococcal:N/A Shingles/Zostavax:N/A  Advanced directive:N/A Health care power of attorney:N/A Living will:YES  Concerns: Ongoing concerns of SOB as discussed at prior visits . See prior visits for info.  He has seen no change, no improvement with Asmanex or inhaler/albuterol.  Reviewed their medical, surgical, family, social, medication, and allergy history and updated chart as appropriate.    Review of Systems Constitutional: -fever, -chills, -sweats, -unexpected weight change, -decreased appetite, +fatigue Allergy: -sneezing, -itching, -congestion Dermatology: -changing moles, --rash, -lumps ENT: -runny nose, -ear pain, -sore throat, -hoarseness, -sinus pain, -teeth pain, - ringing in ears, -hearing loss, -nosebleeds Cardiology: -chest pain, -palpitations, -swelling, -difficulty breathing when lying flat, -waking up short of breath Respiratory: +cough, -shortness of breath, -difficulty breathing with exercise or exertion, +wheezing, -coughing up blood Gastroenterology: -abdominal pain, -nausea, -vomiting, -diarrhea, -constipation, -blood in stool, -changes in bowel movement, -difficulty swallowing or eating Hematology: -bleeding, -bruising  Musculoskeletal: -joint aches, -muscle aches, -joint swelling, -back pain, -neck pain, -cramping, -changes in gait Ophthalmology: denies vision changes, eye redness, itching, discharge Urology: -burning with urination, -difficulty urinating, -blood in urine, -urinary frequency, -urgency,  -incontinence Neurology: -headache, -weakness, -tingling, -numbness, -memory loss, -falls, +dizziness Psychology: -depressed mood, -agitation, -sleep problems     Objective:   Physical Exam  BP 98/70  Pulse 68  Temp(Src) 98.3 F (36.8 C) (Oral)  Resp 16  Ht 6' 3.2" (1.91 m)  Wt 288 lb (130.636 kg)  BMI 35.81 kg/m2  General appearance: alert, no distress, WD/WN, AA male Skin: complete loss of hair throughout.  Scattered flat brown round patches throughout UE and LE, chronic unchanged per pt, brown pigmentation in mucosa of inside of lips and nares HEENT: normocephalic, conjunctiva/corneas normal, sclerae anicteric, PERRLA, EOMi, nares patent, no discharge or erythema, pharynx normal Oral cavity: MMM, tongue normal, teeth -right upper posterior molar with decay, otherwise teeth in good repair Neck: supple, no lymphadenopathy, no thyromegaly, no masses, normal ROM, no bruits Chest: non tender, normal shape and expansion Heart: RRR, normal S1, S2, no murmurs Lungs: few scattered rhonchi, otherwise CTA bilaterally, no wheezes,no rales Abdomen: +bs, soft, non tender, non distended, no masses, no hepatomegaly, no splenomegaly, no bruits Back: non tender, normal ROM, no scoliosis Musculoskeletal: upper extremities non tender, no obvious deformity, normal ROM throughout, lower extremities non tender, no obvious deformity, normal ROM throughout Extremities: no edema, no cyanosis, no clubbing Pulses: 2+ symmetric, upper and lower extremities, normal cap refill Neurological: alert, oriented x 3, CN2-12 intact, strength normal upper extremities and lower extremities, sensation normal throughout, DTRs 2+ throughout, no cerebellar signs, gait normal Psychiatric: normal affect, behavior normal, pleasant  GU: normal male external genitalia, circumcised, nontender, no masses, no hernia, no lymphadenopathy Rectal: anus normal tone, unable to fully examine prostate given his tenseness and body  habitus   Assessment and Plan :      Encounter Diagnoses  Name Primary?  . Routine general medical examination at a health care facility Yes  . Need for prophylactic vaccination and inoculation against influenza   . Screening for prostate cancer   . Complete loss of hair   .  SOB (shortness of breath)   . Sleep disturbance   . Snoring   . Obesity     Physical exam - discussed healthy lifestyle, diet, exercise, preventative care, vaccinations, and addressed their concerns.   Counseled on the influenza virus vaccine.  Vaccine information sheet given.  Influenza vaccine given after consent obtained. PSA/prostate baseline screen - discussed risks/benefits and he agrees to screening Hair loss - etiology unclear, pending labs SOB - unclear.  Normal PFTs, unremarkable CXR.  Possible deconditioning, sleep apnea? Sleep disturbance, snoring - referral for in home sleep study.  He apparently had in home oximetry screen abnormal in 04/2012. Follow-up pending labs

## 2013-03-10 NOTE — Addendum Note (Signed)
Addended by: Leretha Dykes L on: 03/10/2013 03:20 PM   Modules accepted: Orders

## 2013-03-10 NOTE — Telephone Encounter (Signed)
Message copied by Janeice Robinson on Fri Mar 10, 2013  4:11 PM ------      Message from: [place], [place] S      Created: Fri Mar 10, 2013  3:04 PM       [place], sleep study ------

## 2013-03-11 LAB — PSA: PSA: 1.08 ng/mL (ref <=4.00)

## 2013-03-11 LAB — HIV ANTIBODY (ROUTINE TESTING W REFLEX): HIV: NONREACTIVE

## 2013-03-13 LAB — LUPUS ANTICOAGULANT PANEL
DRVVT: 29.2 secs (ref <42.9)
Lupus Anticoagulant: NOT DETECTED
PTT Lupus Anticoagulant: 33.9 secs (ref 28.0–43.0)

## 2013-03-15 ENCOUNTER — Encounter: Payer: Self-pay | Admitting: Family Medicine

## 2013-03-20 ENCOUNTER — Other Ambulatory Visit: Payer: Self-pay | Admitting: Medical

## 2013-03-20 ENCOUNTER — Telehealth: Payer: Self-pay | Admitting: Family Medicine

## 2013-03-20 MED ORDER — ATORVASTATIN CALCIUM 20 MG PO TABS: 20.0000 mg | ORAL_TABLET | Freq: Every day | ORAL | Status: DC

## 2013-03-20 NOTE — Telephone Encounter (Signed)
Begin Lipitor at bedtime for cholesterol.   Recheck in 81mo. Refer for in home sleep study Refer to [facility]/derm for total hair loss

## 2013-03-20 NOTE — Telephone Encounter (Signed)
Pt called, I advised him of labs.  He will return call to Sleep Study today.  Please call pt with Dermatology appt info.  I also advised pt once we received reports from sleep study and derm that Vincenza Hews will let him know what med to start on for cholesterol.  Pt can be reached at [phone]

## 2013-03-20 NOTE — Telephone Encounter (Signed)
Devin Erickson, what are you going to do about the dermatology referral on this patient. Remember Dr. Terri Piedra states the patients best option is to go [facility]. CLS

## 2013-03-20 NOTE — Telephone Encounter (Signed)
Your message said to check with dermatology to see if they would see him for total hair loss so I called Dr. Terri Piedra and that's what they suggested. CLS

## 2013-03-20 NOTE — Telephone Encounter (Signed)
chandra - pls look into this.   If he saw Terri Piedra and Terri Piedra recommended [facility], then [facility] office needs to do the referral

## 2013-03-21 ENCOUNTER — Other Ambulatory Visit: Payer: Self-pay | Admitting: Family Medicine

## 2013-03-21 DIAGNOSIS — L659 Nonscarring hair loss, unspecified: Secondary | ICD-10-CM

## 2013-03-21 NOTE — Telephone Encounter (Signed)
I mailed the patient a copy of his appointment to see dermatology and i left a message on his voicemail as well. CLS

## 2013-04-18 ENCOUNTER — Encounter: Payer: Self-pay | Admitting: Medical

## 2013-11-10 ENCOUNTER — Telehealth: Payer: Self-pay | Admitting: Medical

## 2013-11-10 NOTE — Telephone Encounter (Signed)
Have we not seen him back since the sleep study?   I recieved a compliance report. Lets have him f/u OV

## 2013-11-13 NOTE — Telephone Encounter (Signed)
I left a message on his voice mail about setting up an appointment. CLS

## 2013-12-01 ENCOUNTER — Ambulatory Visit (INDEPENDENT_AMBULATORY_CARE_PROVIDER_SITE_OTHER): Payer: 59 | Admitting: Medical

## 2013-12-01 ENCOUNTER — Encounter: Payer: Self-pay | Admitting: Medical

## 2013-12-01 VITALS — BP 118/80 | HR 72 | Temp 98.0°F | Resp 16 | Wt 300.0 lb

## 2013-12-01 DIAGNOSIS — G4733 Obstructive sleep apnea (adult) (pediatric): Secondary | ICD-10-CM

## 2013-12-01 DIAGNOSIS — Z9989 Dependence on other enabling machines and devices: Secondary | ICD-10-CM

## 2013-12-01 DIAGNOSIS — E785 Hyperlipidemia, unspecified: Secondary | ICD-10-CM

## 2013-12-01 DIAGNOSIS — L659 Nonscarring hair loss, unspecified: Secondary | ICD-10-CM

## 2013-12-01 DIAGNOSIS — E669 Obesity, unspecified: Secondary | ICD-10-CM

## 2013-12-01 MED ORDER — VITAMIN B-12 1000 MCG PO TABS: 1000.0000 ug | ORAL_TABLET | Freq: Every day | ORAL | Status: DC

## 2013-12-01 MED ORDER — PHENTERMINE HCL 37.5 MG PO TABS: 37.5000 mg | ORAL_TABLET | Freq: Every day | ORAL | Status: DC

## 2013-12-01 NOTE — Patient Instructions (Signed)
  Thank you for giving me the opportunity to serve you today.    Your diagnosis today includes: Encounter Diagnoses  Name Primary?  . Obesity, unspecified Yes  . Hyperlipidemia   . OSA on CPAP   . Hair loss    You can call and reschedule a dermatology appointment. Karren BurlyChandra gave the information for this today.  Continue using her CPAP machine but consider putting a little Vaseline in each nostril before use since you're having some nosebleeds  We are rechecking your cholesterol labs today  Specific recommendations today include: Diet  Increase your water intake, get at least 64 ounces of water daily  Eat 3-4 fruits daily  Eat plenty of vegetables throughout the day, preferably each meal  Eat good sources of grains such as oatmeal, barley, whole grain pasta, whole grain bread, but limit the serving size to 1 cup of oatmeal or pasta per meal or 2 slices of bread per meal  We don't need to meat at each meal, however if you do eat meat, limit serving size to the size of your palm, and eat chicken fish or [place], lean cuts of meat  Eat beans every day as this is a good nutrient source and helps to curb appetite  Consider using a program such as Weight Watchers  Consider using a Smart phone app such as My Fitness [employer] or [employer] to track your calories and progress   Things to limit or avoid:  Avoid fast food, fried foods, fatty foods  Limit sweets, ice cream, cake and other baked goods  Avoid soda, beer, alcohol, sweet tea  Exercise  You need to be exercising most days of the week for 30-45 minutes or more  Good forms of exercise include walking, hiking, stationary bike or bicycling outside, lap swimming, aerobics class, dance, Zumba  Consider getting a trainer at a gym to help with exercise  Medication Begin phentermine weight loss medication once daily in the morning Begin vitamin B complex or vitamin B12 daily  Consider weighing yourself daily to keep track of  your weight   Recheck in one month regarding weight loss

## 2013-12-01 NOTE — Progress Notes (Signed)
   Subjective:   Devin Erickson is a 43 y.o. male presenting on 12/01/2013 with Follow-up  Here for followup on multiple issues  Compliant with CPAP started after last visit and sleep study, is improved with energy level and sleep, less snoring  Obesity-working on trying to lose weight through diet and exercise  He does still complain of fatigue on many days of the month, exercising some  He states he never saw dermatology regarding the hair loss  No other aggravating or relieving factors.  No other complaint.  Review of Systems ROS as in subjective      Objective:    Filed Vitals:   12/01/13 1333  BP: 118/80  Pulse: 72  Temp: 98 F (36.7 C)  Resp: 16    General appearance: alert, no distress, WD/WN Neck: supple, no lymphadenopathy, no thyromegaly, no masses Heart: RRR, normal S1, S2, no murmurs Lungs: CTA bilaterally, no wheezes, rhonchi, or rales Abdomen: +bs, soft, non tender, non distended, no masses, no hepatomegaly, no splenomegaly Pulses: 2+ symmetric, upper and lower extremities, normal cap refill Extremities no edema     Assessment: Encounter Diagnoses  Name Primary?  . Obesity, unspecified Yes  . Hyperlipidemia   . OSA on CPAP   . Hair loss      Plan: Obesity-discussed diet, discussed exercise, discussed water intake, discussed the need to lose weight. Discussed medication options, risk and benefits of medications, begin phentermine. Recheck in one month  Hyperlipidemia-statin started last visit, recheck labs today  Compliant with CPAP her compliance report, he does see improvement with his symptoms, continue CPAP  Hair loss-he never followed up with dermatology, thus he will reschedule with dermatology  [name] was seen today for follow-up.  Diagnoses and associated orders for this visit:  Obesity, unspecified  Hyperlipidemia - Lipid panel - Comprehensive metabolic panel  OSA on CPAP  Hair loss  Other Orders - phentermine  (ADIPEX-P) 37.5 MG tablet; Take 1 tablet (37.5 mg total) by mouth daily before breakfast. - vitamin B-12 (CYANOCOBALAMIN) 1000 MCG tablet; Take 1 tablet (1,000 mcg total) by mouth daily.    Return pending labs.

## 2013-12-02 ENCOUNTER — Other Ambulatory Visit: Payer: Self-pay | Admitting: Medical

## 2013-12-02 LAB — COMPREHENSIVE METABOLIC PANEL
ALT: 23 U/L (ref 0–53)
AST: 26 U/L (ref 0–37)
Albumin: 4.5 g/dL (ref 3.5–5.2)
Alkaline Phosphatase: 44 U/L (ref 39–117)
BUN: 11 mg/dL (ref 6–23)
CO2: 26 mEq/L (ref 19–32)
Calcium: 9.5 mg/dL (ref 8.4–10.5)
Chloride: 102 mEq/L (ref 96–112)
Creat: 0.99 mg/dL (ref 0.50–1.35)
Glucose, Bld: 87 mg/dL (ref 70–99)
Potassium: 3.7 mEq/L (ref 3.5–5.3)
Sodium: 137 mEq/L (ref 135–145)
Total Bilirubin: 1.4 mg/dL — ABNORMAL HIGH (ref 0.2–1.2)
Total Protein: 7.2 g/dL (ref 6.0–8.3)

## 2013-12-02 LAB — LIPID PANEL
Cholesterol: 162 mg/dL (ref 0–200)
HDL: 37 mg/dL — ABNORMAL LOW (ref >39)
LDL Cholesterol: 102 mg/dL — ABNORMAL HIGH (ref 0–99)
Total CHOL/HDL Ratio: 4.4 Ratio
Triglycerides: 114 mg/dL (ref <150)
VLDL: 23 mg/dL (ref 0–40)

## 2013-12-02 MED ORDER — ATORVASTATIN CALCIUM 20 MG PO TABS: 20.0000 mg | ORAL_TABLET | Freq: Every day | ORAL | Status: DC

## 2014-06-14 ENCOUNTER — Other Ambulatory Visit (INDEPENDENT_AMBULATORY_CARE_PROVIDER_SITE_OTHER): Payer: 59

## 2014-06-14 DIAGNOSIS — Z23 Encounter for immunization: Secondary | ICD-10-CM

## 2014-07-02 ENCOUNTER — Encounter: Payer: Self-pay | Admitting: Medical

## 2014-07-06 ENCOUNTER — Encounter: Payer: Self-pay | Admitting: Medical

## 2014-07-06 ENCOUNTER — Ambulatory Visit (INDEPENDENT_AMBULATORY_CARE_PROVIDER_SITE_OTHER): Payer: 59 | Admitting: Medical

## 2014-07-06 ENCOUNTER — Ambulatory Visit
Admission: RE | Admit: 2014-07-06 | Discharge: 2014-07-06 | Disposition: A | Discharge status: Home / Self Care | Payer: PRIVATE HEALTH INSURANCE | Source: Ambulatory Visit | Attending: Medical | Admitting: Medical

## 2014-07-06 ENCOUNTER — Other Ambulatory Visit: Payer: Self-pay | Admitting: Medical

## 2014-07-06 VITALS — BP 100/60 | HR 84 | Temp 98.1°F | Resp 15 | Wt 295.6 lb

## 2014-07-06 DIAGNOSIS — R05 Cough: Secondary | ICD-10-CM

## 2014-07-06 DIAGNOSIS — R0602 Shortness of breath: Secondary | ICD-10-CM | POA: Diagnosis not present

## 2014-07-06 DIAGNOSIS — R062 Wheezing: Secondary | ICD-10-CM

## 2014-07-06 DIAGNOSIS — R059 Cough, unspecified: Secondary | ICD-10-CM

## 2014-07-06 LAB — CBC WITH DIFFERENTIAL/PLATELET
Basophils Absolute: 0 10*3/uL (ref 0.0–0.1)
Basophils Relative: 0 % (ref 0–1)
Eosinophils Absolute: 0 10*3/uL (ref 0.0–0.7)
Eosinophils Relative: 0 % (ref 0–5)
HCT: 43.6 % (ref 39.0–52.0)
Hemoglobin: 14.4 g/dL (ref 13.0–17.0)
Lymphocytes Relative: 26 % (ref 12–46)
Lymphs Abs: 2.1 10*3/uL (ref 0.7–4.0)
MCH: 24.5 pg — ABNORMAL LOW (ref 26.0–34.0)
MCHC: 33 g/dL (ref 30.0–36.0)
MCV: 74.2 fL — ABNORMAL LOW (ref 78.0–100.0)
Monocytes Absolute: 0.3 10*3/uL (ref 0.1–1.0)
Monocytes Relative: 4 % (ref 3–12)
Neutro Abs: 5.7 10*3/uL (ref 1.7–7.7)
Neutrophils Relative %: 70 % (ref 43–77)
Platelets: 195 10*3/uL (ref 150–400)
RBC: 5.87 MIL/uL — ABNORMAL HIGH (ref 4.22–5.81)
RDW: 15.9 % — ABNORMAL HIGH (ref 11.5–15.5)
WBC: 8.2 10*3/uL (ref 4.0–10.5)

## 2014-07-06 MED ORDER — HYDROCODONE-HOMATROPINE 5-1.5 MG/5ML PO SYRP: 5.0000 mL | ORAL_SOLUTION | Freq: Three times a day (TID) | ORAL | Status: DC | PRN

## 2014-07-06 MED ORDER — METHYLPREDNISOLONE (PAK) 4 MG PO TABS: ORAL_TABLET | ORAL | Status: DC

## 2014-07-06 MED ORDER — ALBUTEROL SULFATE HFA 108 (90 BASE) MCG/ACT IN AERS: 2.0000 | INHALATION_SPRAY | Freq: Four times a day (QID) | RESPIRATORY_TRACT | Status: DC | PRN

## 2014-07-06 NOTE — Progress Notes (Signed)
Subjective:  Devin Erickson is a 44 y.o. male who presents for possible bronchitis.  Symptoms include 2 week history of cold symptoms, but has ongoing symptoms.  Thought he was getting better but 2 nights ago getting worse cough, not sleeping well. Has felt feverish at times including last night, SOB, some nausea, some sinus pressure, wheezing.  Denies sweats, chills, sore throat.  Using OTC tussin DM.  Nonsmoker.  Has had some sick contacts.   Does have hx/o pneumonia within last few years.  No hx/o asthma.  No other aggravating or relieving factors.  No other c/o.  The following portions of the patient's history were reviewed and updated as appropriate: allergies, current medications, past family history, past medical history, past social history, past surgical history and problem list.  ROS as in subjective  Past Medical History  Diagnosis Date  . Thalassemia minor   . Wears glasses   . Eczema   . Hair loss     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  . Bipolar disorder   . Depression   . Anxiety   . Wears glasses   . H/O exercise stress test 2009    [facility], normal per pt  . History of sleep apnea 04/2012    per overnight screen/oximetry   . Obesity      Objective: BP 100/60 mmHg  Pulse 84  Temp(Src) 98.1 F (36.7 C) (Oral)  Resp 15  Wt 295 lb 9.6 oz (134.083 kg)  SpO2 94%  General appearance: Alert, WD/WN, no distress, ill appearing                             Skin: warm, no rash, no diaphoresis                           Head: no sinus tenderness                            Eyes: conjunctiva normal, corneas clear, PERRLA                            Ears: pearly TMs, external ear canals normal                          Nose: septum midline, turbinates swollen, with erythema and clear discharge             Mouth/throat: MMM, tongue normal, mild pharyngeal erythema                           Neck: supple, no adenopathy, no thyromegaly, nontender                          Heart:  RRR, normal S1, S2, no murmurs                         Lungs: +decreased breath sounds in general,no rhonchi, no wheezes, no rales                Extremities: no edema, nontender     Assessment: Encounter Diagnoses  Name Primary?  . SOB (shortness of breath) Yes  . Cough   . Wheezing       Plan:  Medication orders today include: Hycodan prn, albuterol, discussed proper use.  CBC STAT, go for CXR. Discussed supportive care, Tylenol or Ibuprofen OTC for fever and malaise.  We will call with results and plan.

## 2014-07-13 ENCOUNTER — Encounter: Payer: Self-pay | Admitting: Medical

## 2014-08-01 ENCOUNTER — Encounter: Payer: Self-pay | Admitting: Medical

## 2014-08-01 ENCOUNTER — Ambulatory Visit (INDEPENDENT_AMBULATORY_CARE_PROVIDER_SITE_OTHER): Payer: 59 | Admitting: Medical

## 2014-08-01 VITALS — BP 102/80 | HR 67 | Temp 98.1°F | Resp 15 | Ht 75.5 in | Wt 301.0 lb

## 2014-08-01 DIAGNOSIS — E669 Obesity, unspecified: Secondary | ICD-10-CM

## 2014-08-01 DIAGNOSIS — G4733 Obstructive sleep apnea (adult) (pediatric): Secondary | ICD-10-CM | POA: Insufficient documentation

## 2014-08-01 DIAGNOSIS — Z0000 Encounter for general adult medical examination without abnormal findings: Secondary | ICD-10-CM

## 2014-08-01 DIAGNOSIS — J309 Allergic rhinitis, unspecified: Secondary | ICD-10-CM | POA: Insufficient documentation

## 2014-08-01 DIAGNOSIS — Z9989 Dependence on other enabling machines and devices: Secondary | ICD-10-CM

## 2014-08-01 DIAGNOSIS — E785 Hyperlipidemia, unspecified: Secondary | ICD-10-CM | POA: Insufficient documentation

## 2014-08-01 DIAGNOSIS — R0600 Dyspnea, unspecified: Secondary | ICD-10-CM | POA: Insufficient documentation

## 2014-08-01 DIAGNOSIS — R040 Epistaxis: Secondary | ICD-10-CM | POA: Insufficient documentation

## 2014-08-01 LAB — CBC
HCT: 39.3 % (ref 39.0–52.0)
Hemoglobin: 13.4 g/dL (ref 13.0–17.0)
MCH: 24.4 pg — ABNORMAL LOW (ref 26.0–34.0)
MCHC: 34.1 g/dL (ref 30.0–36.0)
MCV: 71.6 fL — ABNORMAL LOW (ref 78.0–100.0)
MPV: 10.3 fL (ref 8.6–12.4)
Platelets: 165 10*3/uL (ref 150–400)
RBC: 5.49 MIL/uL (ref 4.22–5.81)
RDW: 18.2 % — ABNORMAL HIGH (ref 11.5–15.5)
WBC: 6.2 10*3/uL (ref 4.0–10.5)

## 2014-08-01 LAB — TSH: TSH: 1.33 u[IU]/mL (ref 0.350–4.500)

## 2014-08-01 MED ORDER — MONTELUKAST SODIUM 10 MG PO TABS: 10.0000 mg | ORAL_TABLET | Freq: Every day | ORAL | Status: DC

## 2014-08-01 MED ORDER — MOMETASONE FUROATE 220 MCG/INH IN AEPB: 2.0000 | INHALATION_SPRAY | Freq: Two times a day (BID) | RESPIRATORY_TRACT | Status: DC

## 2014-08-01 MED ORDER — FEXOFENADINE HCL 180 MG PO TABS: 180.0000 mg | ORAL_TABLET | Freq: Every day | ORAL | Status: DC

## 2014-08-01 MED ORDER — ALBUTEROL SULFATE HFA 108 (90 BASE) MCG/ACT IN AERS: 2.0000 | INHALATION_SPRAY | Freq: Four times a day (QID) | RESPIRATORY_TRACT | Status: DC | PRN

## 2014-08-01 MED ORDER — ATORVASTATIN CALCIUM 20 MG PO TABS: 20.0000 mg | ORAL_TABLET | Freq: Every day | ORAL | Status: DC

## 2014-08-01 NOTE — Progress Notes (Signed)
Subjective:   HPI  Devin Erickson is a 44 y.o. male who presents for a complete physical.   Preventative care: Last ophthalmology visit: YES - 06/3014 L[facility]  Last dental visit: YES SEEN 2015 Last colonoscopy:few years ago Last prostate exam:  Last EKG:8/ 2014 Last labs:CBC ON 06/2014  Prior vaccinations: TD or Tdap:2014 Influenza:05/2014 Pneumococcal:N/A Shingles/Zostavax:N/A  Concerns: Compliant with CPAP but doesn't like the single nose mask like to try different facemask that covers the mouth.  Snoring and energy level improved somewhat  For the last 2 weeks having nosebleeds at least twice a day lasting for 4-5 minutes until he gets them to stop  Still gets difficulty breathing from time to time, wonders if it's allergies or something else area sometimes this scares him make something he's having a heart attack  Reviewed their medical, surgical, family, social, medication, and allergy history and updated chart as appropriate.  Past Medical History  Diagnosis Date  . Thalassemia minor   . Wears glasses   . Eczema   . Hair loss     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  . Bipolar disorder   . Depression   . Anxiety   . Wears glasses   . H/O exercise stress test 2009    [facility], normal per pt  . History of sleep apnea 04/2012    per overnight screen/oximetry   . Obesity     Past Surgical History  Procedure Laterality Date  . Tonsillectomy and adenoidectomy      age 4yo  . Colonoscopy  2012    [facility], normal per pt    History   Social History  . Marital Status: Married    Spouse Name: N/A  . Number of Children: N/A  . Years of Education: N/A   Occupational History  . Not on file.   Social History Main Topics  . Smoking status: Never Smoker   . Smokeless tobacco: Not on file  . Alcohol Use: No     Comment: few beers a week.maybe 3-4  . Drug Use: No  . Sexual Activity: Not on file   Other Topics Concern  . 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  [place] Witness    Family History  Problem Relation Age of Onset  . Cancer Mother 90    colon  . Hypertension Mother   . Dementia Father   . Cancer Father     pancreas  . Heart disease Neg Hx   . Lung disease Neg Hx      Current outpatient prescriptions:  .  albuterol (PROVENTIL HFA;VENTOLIN HFA) 108 (90 BASE) MCG/ACT inhaler, Inhale 2 puffs into the lungs every 6 (six) hours as needed for wheezing or shortness of breath., Disp: 1 Inhaler, Rfl: 0 .  atorvastatin (LIPITOR) 20 MG tablet, Take 1 tablet (20 mg total) by mouth daily., Disp: 90 tablet, Rfl: 3 .  fexofenadine (HM FEXOFENADINE HCL) 180 MG tablet, Take 1 tablet (180 mg total) by mouth daily., Disp: 90 tablet, Rfl: 1 .  mometasone (ASMANEX 60 METERED DOSES) 220 MCG/INH inhaler, Inhale 2 puffs into the lungs 2 (two) times daily., Disp: 1 Inhaler, Rfl: 1 .  montelukast (SINGULAIR) 10 MG tablet, Take 1 tablet (10 mg total) by mouth at bedtime., Disp: 30 tablet, Rfl: 2 .  phentermine (ADIPEX-P) 37.5 MG tablet, Take 1 tablet (37.5 mg total) by mouth daily before breakfast. (Patient not  taking: Reported on 07/06/2014), Disp: 30 tablet, Rfl: 0 .  vitamin B-12 (CYANOCOBALAMIN) 1000 MCG tablet, Take 1 tablet (1,000 mcg total) by mouth daily. (Patient not taking: Reported on 08/01/2014), Disp: 30 tablet, Rfl: 2  No Known Allergies   Review of Systems Constitutional: -fever, -chills, -sweats, -unexpected weight change, -decreased appetite, -fatigue Allergy: -sneezing, -itching, -congestion Dermatology: -changing moles, --rash, -lumps ENT: -runny nose, -ear pain, -sore throat, -hoarseness, -sinus pain, -teeth pain, - ringing in ears, -hearing loss, +nosebleeds Cardiology: -chest pain, -palpitations, -swelling, -difficulty breathing when lying flat, -waking up short of breath Respiratory: +cough, +shortness of breath, +difficulty breathing with exercise or  exertion, -wheezing, -coughing up blood Gastroenterology: -abdominal pain, -nausea, -vomiting, -diarrhea, -constipation, -blood in stool, -changes in bowel movement, -difficulty swallowing or eating Hematology: -bleeding, -bruising  Musculoskeletal: -joint aches, -muscle aches, -joint swelling, -back pain, -neck pain, -cramping, -changes in gait Ophthalmology: denies vision changes, eye redness, itching, discharge Urology: -burning with urination, -difficulty urinating, -blood in urine, -urinary frequency, -urgency, -incontinence Neurology: -headache, -weakness, -tingling, -numbness, -memory loss, -falls, -dizziness Psychology: -depressed mood, -agitation, +sleep problems     Objective:   Physical Exam  BP 102/80 mmHg  Pulse 67  Temp(Src) 98.1 F (36.7 C) (Oral)  Resp 15  Ht 6' 3.5" (1.918 m)  Wt 301 lb (136.533 kg)  BMI 37.11 kg/m2  Wt Readings from Last 3 Encounters:  08/01/14 301 lb (136.533 kg)  07/06/14 295 lb 9.6 oz (134.083 kg)  12/01/13 300 lb (136.079 kg)   BP Readings from Last 3 Encounters:  08/01/14 102/80  07/06/14 100/60  12/01/13 118/80    General appearance: alert, no distress, WD/WN, AA male Skin: Old small round flat scars of forearms and legs, scattered few macules, no worrisome lesions, no body hair on scalp, axilla, genitalia for the last few years HEENT: normocephalic, conjunctiva/corneas normal, sclerae anicteric, PERRLA, EOMi, nares with turbinated edema, clear discharge and bilat anterior nares with inferior and septal friable appearance with erythema, pharynx normal Oral cavity: MMM, tongue normal, teeth in good repair Neck: supple, no lymphadenopathy, no thyromegaly, no masses, normal ROM, no bruits Chest: non tender, normal shape and expansion Heart: RRR, normal S1, S2, no murmurs Lungs: decreased breath sounds in general, no wheezes, rhonchi, or rales Abdomen: +bs, soft, non tender, non distended, no masses, no hepatomegaly, no splenomegaly, no  bruits Back: non tender, normal ROM, no scoliosis Musculoskeletal: upper extremities non tender, no obvious deformity, normal ROM throughout, lower extremities non tender, no obvious deformity, normal ROM throughout Extremities: no edema, no cyanosis, no clubbing Pulses: 2+ symmetric, upper and lower extremities, normal cap refill Neurological: alert, oriented x 3, CN2-12 intact, strength normal upper extremities and lower extremities, sensation normal throughout, DTRs 2+ throughout, no cerebellar signs, gait normal Psychiatric: normal affect, behavior normal, pleasant  GU: normal male external genitalia, circumcised, nontender, no masses, no hernia, no lymphadenopathy Rectal: deferred   Adult ECG Report  Indication: dyspnea  Rate: 59 bpm  Rhythm: sinus bradycardia  QRS Axis: 51 degrees  PR Interval: 176ms  QRS Duration: 108ms  QTc: 427ms  Conduction Disturbances: none  Other Abnormalities: poor R wave progression  Patient's cardiac risk factors are: dyslipidemia, male gender, obesity (BMI >= 30 kg/m2) and OSA.  EKG comparison: 11/2012 no change  Narrative Interpretation: no acute changes, sinus bradycardia, poor R wave progression     Assessment and Plan :    Encounter Diagnoses  Name Primary?  . Encounter for health maintenance examination in adult Yes  .  OSA on CPAP   . Hyperlipidemia   . Allergic rhinitis, unspecified allergic rhinitis type   . Dyspnea   . Obesity   . Epistaxis     Physical exam - discussed healthy lifestyle, diet, exercise, preventative care, vaccinations, and addressed their concerns.   OSA on CPAP - advised he contact home health about trying a different facemask and confirm its humidified and heated Hyperlipidemia- compliant with Lipitor, reviewed labs from a few months ago Epistaxis-labs today.  Discussed symptoms, concerns, discussed cautery and achieving hemostasis.  Discussed risks and benefits of procedure.  He gives consent.   Used 2% lidocaine  with epinephrine on sterile cotton swab for local anesthesia inside the bilateral nare (s).  Use silver nitrate to achieve hemostasis. Discussed after care  F/u in 1 week Allergic rhinitis-stop zyrtec.  Begin allegra and Singulair QHS Dyspnea - spirometry an EKG done today.  Possible restrictive pattern.   1 mo trial of Asmanex and improving therapy for allergies.  If not seeing big improvement, consider pulmonology consult. Obesity-discussed exercise, diet, discussed strategies for weight loss Follow-up pending labs

## 2014-08-02 LAB — PROTIME-INR
INR: 1.05 (ref <1.50)
Prothrombin Time: 13.7 seconds (ref 11.6–15.2)

## 2014-08-02 LAB — HEMOGLOBIN A1C
Hgb A1c MFr Bld: 5.9 % — ABNORMAL HIGH (ref <5.7)
Mean Plasma Glucose: 123 mg/dL — ABNORMAL HIGH (ref <117)

## 2014-08-02 LAB — TESTOSTERONE: Testosterone: 429 ng/dL (ref 300–890)

## 2014-08-02 LAB — APTT: aPTT: 29 seconds (ref 24–37)

## 2014-08-02 LAB — VITAMIN D 25 HYDROXY (VIT D DEFICIENCY, FRACTURES): Vit D, 25-Hydroxy: 19 ng/mL — ABNORMAL LOW (ref 30–100)

## 2014-08-03 ENCOUNTER — Encounter: Payer: Self-pay | Admitting: Medical

## 2014-08-06 ENCOUNTER — Other Ambulatory Visit: Payer: Self-pay | Admitting: Medical

## 2014-08-06 MED ORDER — VITAMIN D (ERGOCALCIFEROL) 1.25 MG (50000 UNIT) PO CAPS: 50000.0000 [IU] | ORAL_CAPSULE | ORAL | Status: DC

## 2014-08-15 IMAGING — CR DG CHEST 2V
2 series · 2 of 2 positions shown · non-contrast
Comparison: 10/23/2012 and prior chest radiographs dating back to
12/05/2010

CLINICAL DATA: 42-year-old male with shortness of breath and cough

CHEST - 2 VIEW

[w chest pa]
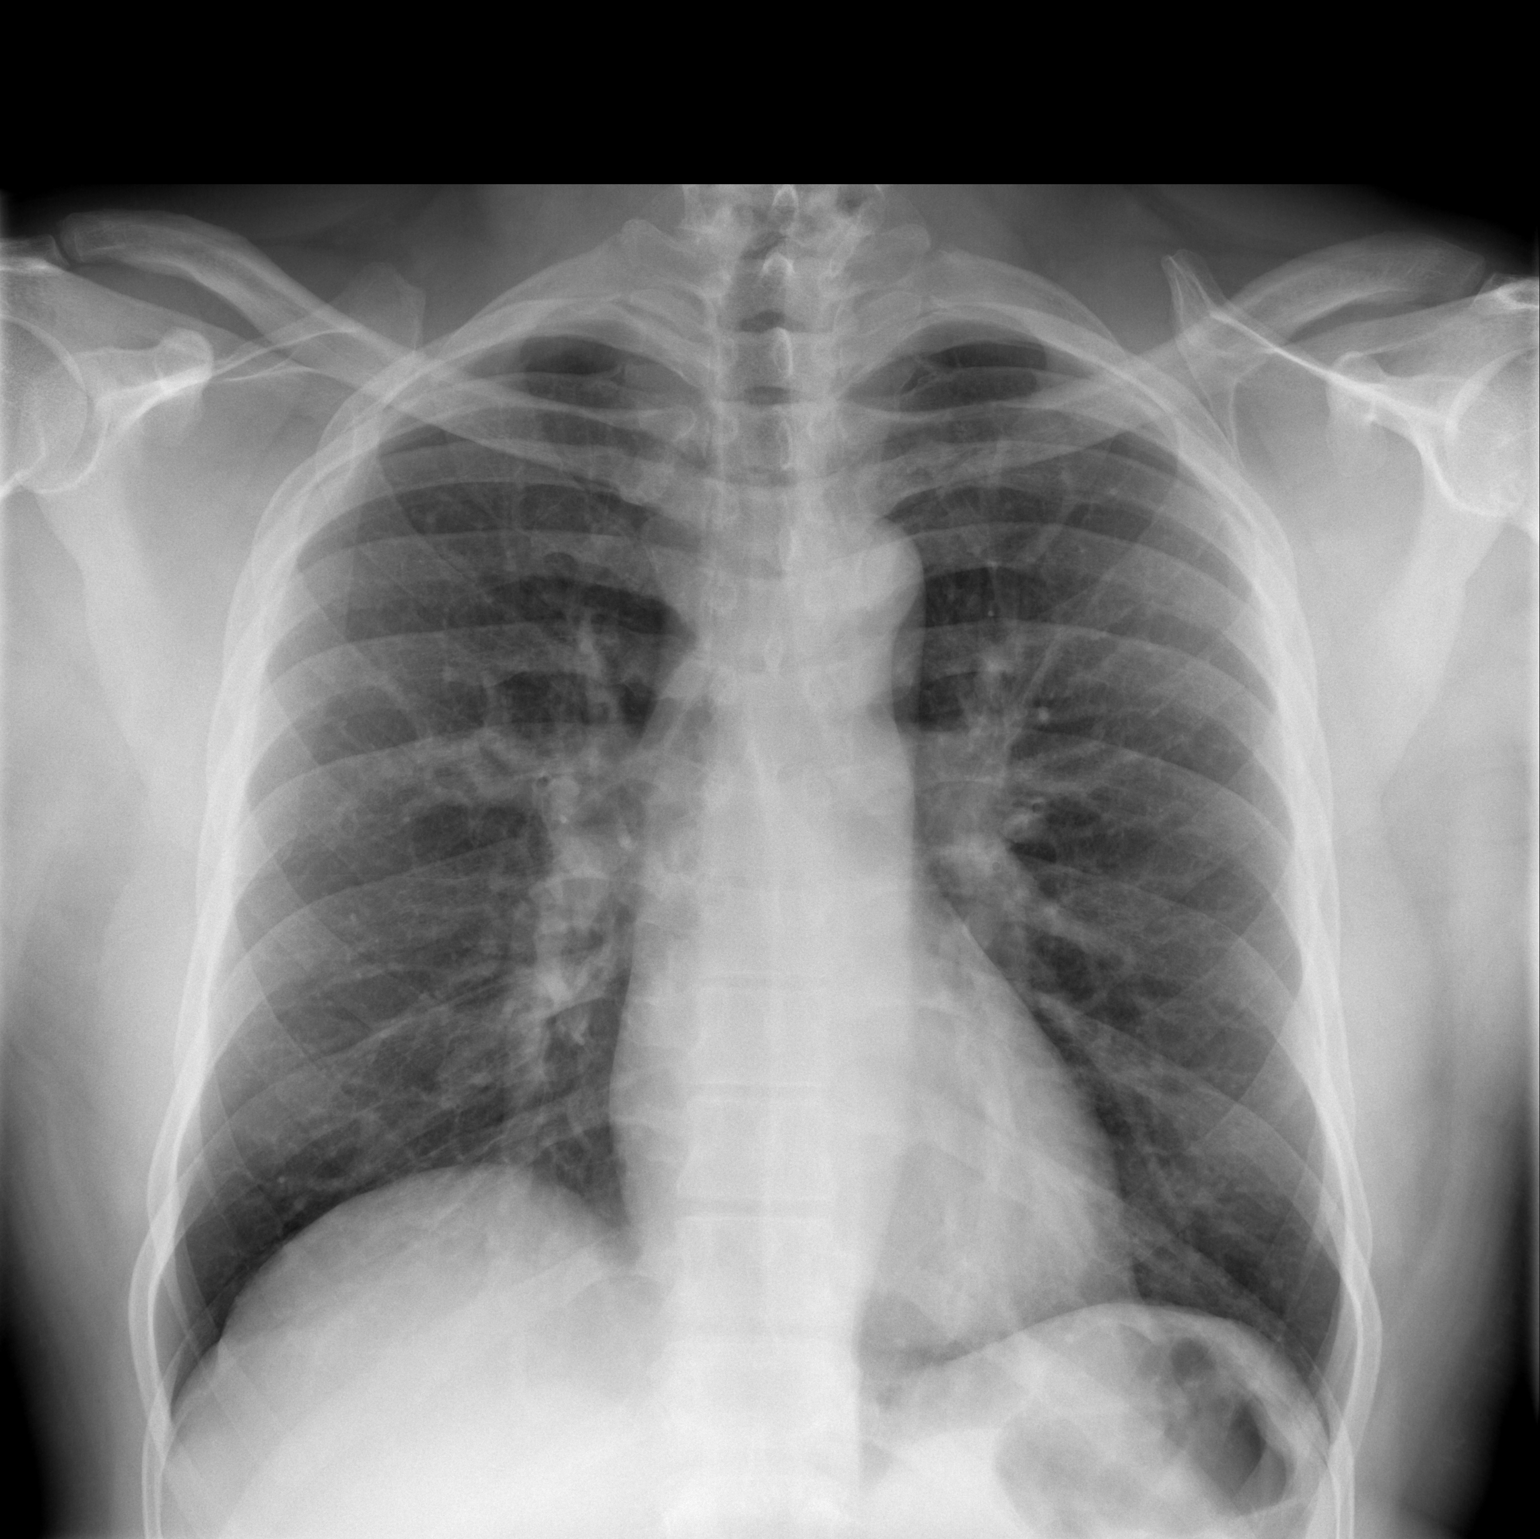

[w chest lat]
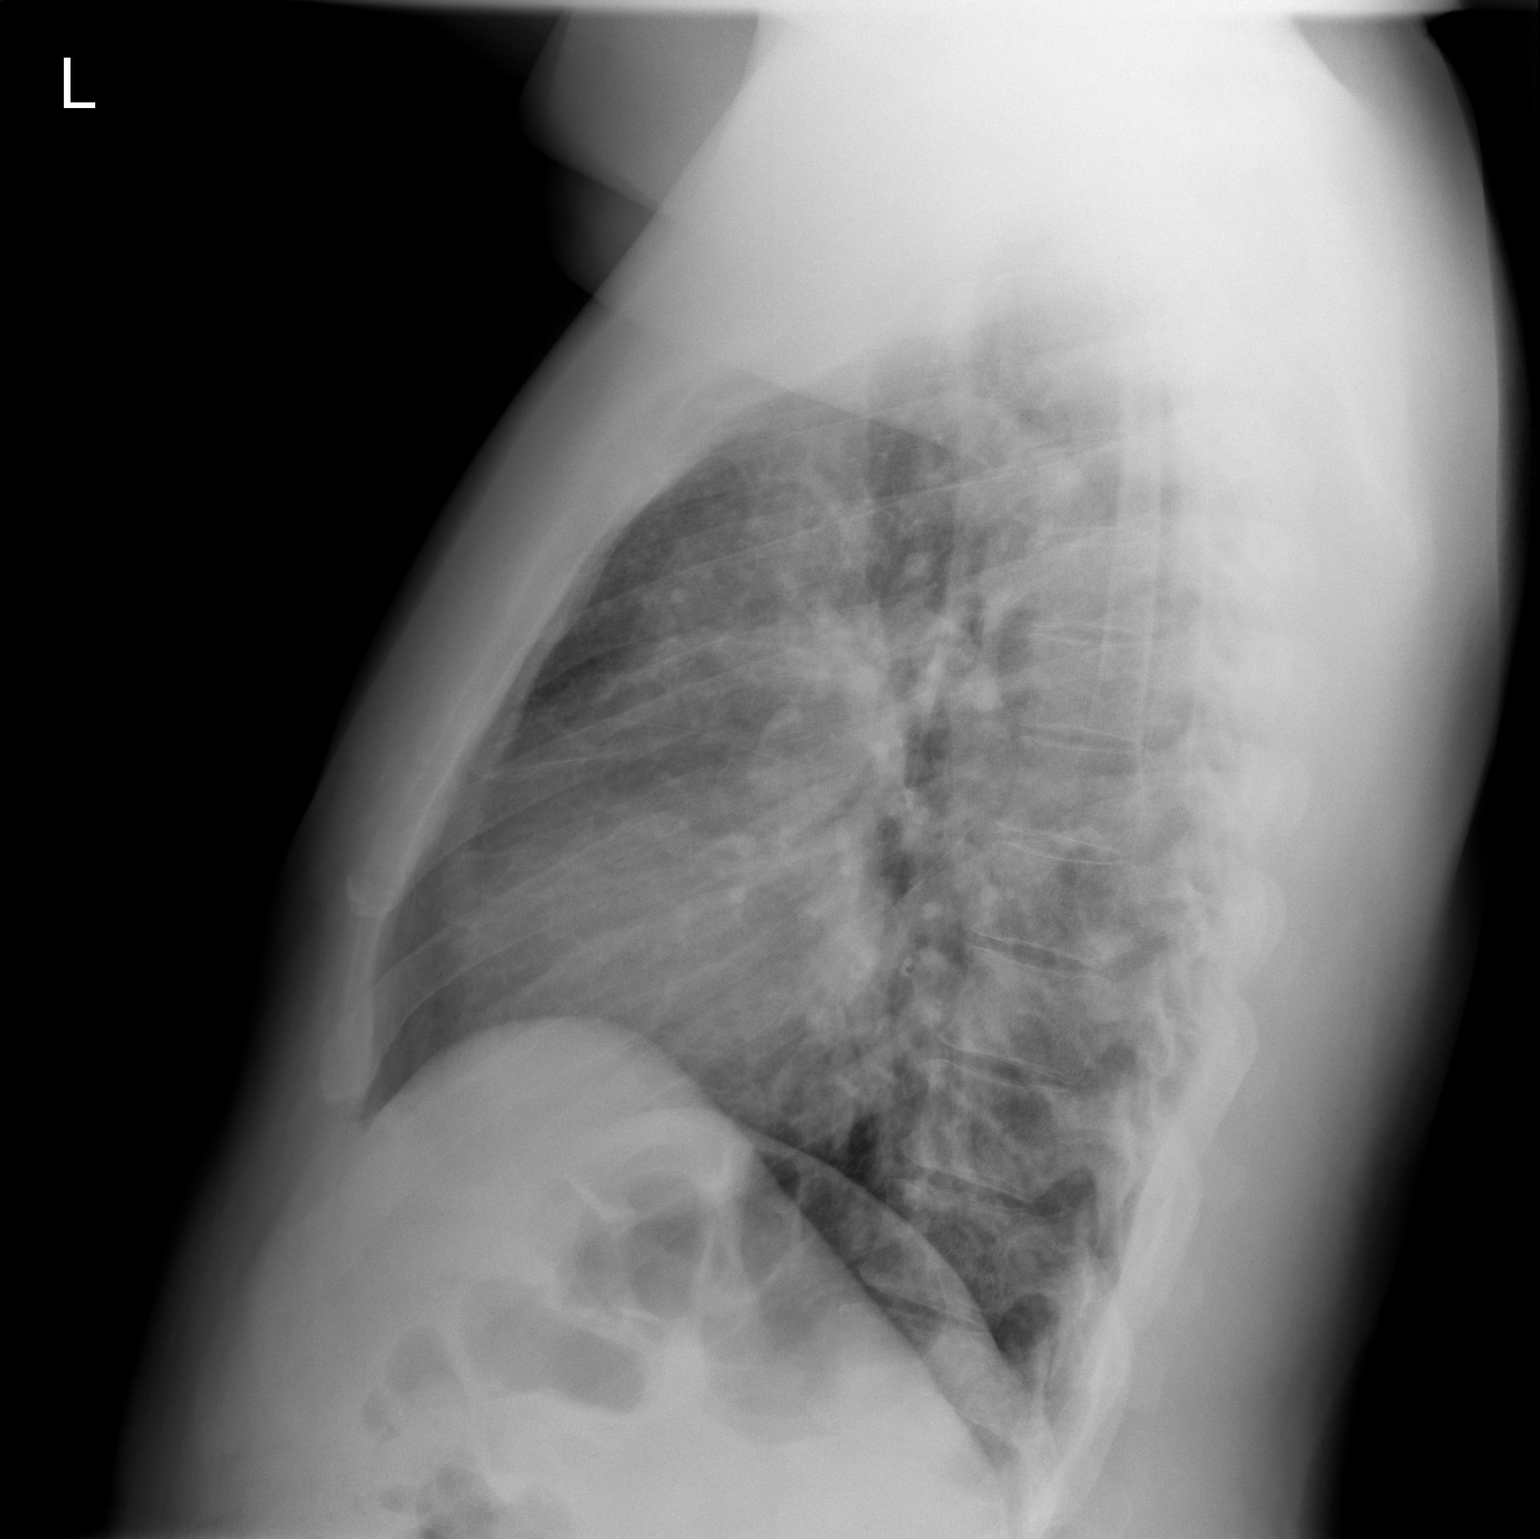

[2 of 2 positions shown; findings below may reference images not displayed]

FINDINGS: The cardiomediastinal silhouette is unremarkable.
Mild peribronchial thickening is unchanged.
There is no evidence of focal airspace disease, pulmonary edema,
suspicious pulmonary nodule/mass, pleural effusion, or
pneumothorax.
No acute bony abnormalities are identified.
IMPRESSION: No evidence of active cardiopulmonary disease.

## 2014-09-27 ENCOUNTER — Telehealth: Payer: Self-pay | Admitting: Family Medicine

## 2014-09-27 DIAGNOSIS — R0600 Dyspnea, unspecified: Secondary | ICD-10-CM

## 2014-09-27 MED ORDER — MONTELUKAST SODIUM 10 MG PO TABS: 10.0000 mg | ORAL_TABLET | Freq: Every day | ORAL | Status: DC

## 2014-09-27 NOTE — Telephone Encounter (Signed)
Done

## 2014-09-27 NOTE — Telephone Encounter (Signed)
[facility] requesting 90 day supply for MONTELUKAST SOD 10mg  tab. Pt. Was last seen for OV 08-01-14.   Send to: [facility] [phone]  Fax:[phone]

## 2014-10-13 ENCOUNTER — Other Ambulatory Visit: Payer: Self-pay | Admitting: Medical

## 2014-10-22 ENCOUNTER — Encounter: Payer: Self-pay | Admitting: Medical

## 2014-10-22 ENCOUNTER — Ambulatory Visit (INDEPENDENT_AMBULATORY_CARE_PROVIDER_SITE_OTHER): Payer: 59 | Admitting: Medical

## 2014-10-22 VITALS — BP 120/80 | HR 91 | Temp 98.5°F | Resp 15 | Wt 298.0 lb

## 2014-10-22 DIAGNOSIS — J4531 Mild persistent asthma with (acute) exacerbation: Secondary | ICD-10-CM | POA: Diagnosis not present

## 2014-10-22 DIAGNOSIS — R0600 Dyspnea, unspecified: Secondary | ICD-10-CM | POA: Diagnosis not present

## 2014-10-22 DIAGNOSIS — R509 Fever, unspecified: Secondary | ICD-10-CM | POA: Diagnosis not present

## 2014-10-22 MED ORDER — OXYMETAZOLINE HCL 0.05 % NA SOLN: 1.0000 | Freq: Two times a day (BID) | NASAL | Status: DC

## 2014-10-22 MED ORDER — ALBUTEROL SULFATE HFA 108 (90 BASE) MCG/ACT IN AERS: 2.0000 | INHALATION_SPRAY | Freq: Four times a day (QID) | RESPIRATORY_TRACT | Status: DC | PRN

## 2014-10-22 MED ORDER — AZITHROMYCIN 250 MG PO TABS: ORAL_TABLET | ORAL | Status: DC

## 2014-10-22 NOTE — Progress Notes (Signed)
Subjective:  Devin Erickson is a 44 y.o. male who presents for illness, cough.  Symptoms include 4 day hx/o cough, rattly chest, dyspnea, wheezing, fever up to 101, sore throat, mild sinus pressure.   Denies N/V/D, ear pain.  Treatment to date: his ongoing medication, Asmanex BID, has used albuterol a few times.  ? sick contacts.   He does not smoke.   He does have a history of pneumonia.   +household sick contacts, son has same and is being seen by his doctor today.  No other aggravating or relieving factors.  No other c/o.  The following portions of the patient's history were reviewed and updated as appropriate: allergies, current medications, past family history, past medical history, past social history, past surgical history and problem list.  ROS as in subjective  Past Medical History  Diagnosis Date  . Thalassemia minor   . Wears glasses   . Eczema   . Hair loss     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  . Bipolar disorder   . Depression   . Anxiety   . Wears glasses   . H/O exercise stress test 2009    [facility], normal per pt  . History of sleep apnea 04/2012    per overnight screen/oximetry   . Obesity      Objective: BP 120/80 mmHg  Pulse 91  Temp(Src) 98.5 F (36.9 C) (Oral)  Resp 15  Wt 298 lb (135.172 kg)  SpO2 94%   General appearance: Alert, WD/WN, no distress, ill appearing                             Skin: warm, no rash, no diaphoresis                           Head: no sinus tenderness                            Eyes: conjunctiva normal, corneas clear, PERRLA                            Ears: pearly TMs, external ear canals normal                          Nose: septum midline, turbinates swollen, right side occluded, with erythema and clear discharge             Mouth/throat: MMM, tongue normal, mild pharyngeal erythema                           Neck: supple, no adenopathy, no thyromegaly, nontender                          Heart: RRR, normal S1, S2, no  murmurs                         Lungs: +bronchial breath sounds, +scattered rhonchi, no wheezes, no rales                Extremities: no edema, nontender     Assessment: Encounter Diagnoses  Name Primary?  Marland Kitchen Asthmatic bronchitis, mild persistent, with acute exacerbation Yes  . Dyspnea   . Fever and chills  Plan:  Medication orders today include: Devin Erickson was seen today for cough and shortness of breath.  Diagnoses and all orders for this visit:  Asthmatic bronchitis, mild persistent, with acute exacerbation Orders: -     Pulse oximetry (single); Future  Dyspnea Orders: -     albuterol (PROVENTIL HFA;VENTOLIN HFA) 108 (90 BASE) MCG/ACT inhaler; Inhale 2 puffs into the lungs every 6 (six) hours as needed for wheezing or shortness of breath. -     Pulse oximetry (single); Future  Fever and chills Orders: -     Pulse oximetry (single); Future  Other orders -     azithromycin (ZITHROMAX) 250 MG tablet; 2 tablets day 1, then 1 tablet days 2-4 -     oxymetazoline (AFRIN NASAL SPRAY) 0.05 % nasal spray; Place 1 spray into both nostrils 2 (two) times daily.   Discussed diagnosis and treatment, c/t Asmanex BID, increased albuterol use to q4-6 hours this week, begin Zpak, use Afrin OTC short term 3-4 days for nasal turbinated occlusion/stuffiness.   Tylenol or Ibuprofen OTC for fever and malaise.  Call/return in 2-3 days if symptoms are worse or not improving.  Advised that cough may linger even after the infection is improved.

## 2015-02-01 ENCOUNTER — Ambulatory Visit (INDEPENDENT_AMBULATORY_CARE_PROVIDER_SITE_OTHER): Payer: 59 | Admitting: Medical

## 2015-02-01 ENCOUNTER — Encounter: Payer: Self-pay | Admitting: Medical

## 2015-02-01 VITALS — BP 104/78 | Temp 97.5°F | Wt 304.0 lb

## 2015-02-01 DIAGNOSIS — R5382 Chronic fatigue, unspecified: Secondary | ICD-10-CM | POA: Diagnosis not present

## 2015-02-01 DIAGNOSIS — Z23 Encounter for immunization: Secondary | ICD-10-CM

## 2015-02-01 DIAGNOSIS — L259 Unspecified contact dermatitis, unspecified cause: Secondary | ICD-10-CM | POA: Diagnosis not present

## 2015-02-01 DIAGNOSIS — R040 Epistaxis: Secondary | ICD-10-CM

## 2015-02-01 DIAGNOSIS — E669 Obesity, unspecified: Secondary | ICD-10-CM | POA: Diagnosis not present

## 2015-02-01 LAB — CBC
HCT: 39.8 % (ref 39.0–52.0)
Hemoglobin: 13.4 g/dL (ref 13.0–17.0)
MCH: 24.1 pg — ABNORMAL LOW (ref 26.0–34.0)
MCHC: 33.7 g/dL (ref 30.0–36.0)
MCV: 71.5 fL — ABNORMAL LOW (ref 78.0–100.0)
Platelets: 185 10*3/uL (ref 150–400)
RBC: 5.57 MIL/uL (ref 4.22–5.81)
RDW: 17.5 % — ABNORMAL HIGH (ref 11.5–15.5)
WBC: 4.6 10*3/uL (ref 4.0–10.5)

## 2015-02-01 LAB — HEMOGLOBIN A1C
Hgb A1c MFr Bld: 5.4 % (ref <5.7)
Mean Plasma Glucose: 108 mg/dL (ref <117)

## 2015-02-01 LAB — COMPREHENSIVE METABOLIC PANEL
ALT: 25 U/L (ref 9–46)
AST: 29 U/L (ref 10–40)
Albumin: 4.2 g/dL (ref 3.6–5.1)
Alkaline Phosphatase: 43 U/L (ref 40–115)
BUN: 11 mg/dL (ref 7–25)
CO2: 27 mmol/L (ref 20–31)
Calcium: 9.1 mg/dL (ref 8.6–10.3)
Chloride: 104 mmol/L (ref 98–110)
Creat: 0.9 mg/dL (ref 0.60–1.35)
Glucose, Bld: 88 mg/dL (ref 65–99)
Potassium: 4.2 mmol/L (ref 3.5–5.3)
Sodium: 139 mmol/L (ref 135–146)
Total Bilirubin: 0.8 mg/dL (ref 0.2–1.2)
Total Protein: 6.8 g/dL (ref 6.1–8.1)

## 2015-02-01 LAB — RHEUMATOID FACTOR: Rhuematoid fact SerPl-aCnc: <10 IU/mL (ref <=14)

## 2015-02-01 MED ORDER — BETAMETHASONE DIPROPIONATE 0.05 % EX CREA: TOPICAL_CREAM | Freq: Two times a day (BID) | CUTANEOUS | Status: DC

## 2015-02-01 NOTE — Progress Notes (Signed)
Subjective: Chief Complaint  Patient presents with  . Rash    rash inbetween fingers, and nose bleeds. rash has been on and off for a year now. said you have spoke on it before. had eye exam in april/may '16. not fasting. flu shot given. wants to talk about diet. no other concerns.   Here for several concerns  Has rash again with a vengeance.  Rash has been off and on for a year.   Gets rash of both palms and between fingers.   Fingers swell with rash occasionally, itchy.  Not sure what makes it worse.   Using cocoa butter and Vaseline which soothes the rash.   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  If doing yard work, has gloves on.  As a child had bad rashes like this.   Had testing in the past showing too much Vit C.  Advised to stay away from latex.    Use Clorox wipes to clean phone and computer at work.   In the past had cream prescribed but doesn't recall the name.  Sometimes red and cracks open.   Nosebleeds started up again the other day.   Weather changes seem to flare this up from time to time.  Has been seen here before for same.  Had bad drippy nosebleed recently, took 20 minutes to get the nosebleed to stop.    Can't seem to lose weight.  Exercise 3 days per week at gym, other days at home, eats what he think is healthy including protein shakes for some meals, still unable to lose weight.     Stays fatigued all the time despite CPAP, exercise, healthy diet.  Wants labs. denies depression.  No current asthma issues .   Past Medical History  Diagnosis Date  . Thalassemia minor   . Wears glasses   . Eczema   . Hair loss     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  . Bipolar disorder (HCC)   . Depression   . Anxiety   . Wears glasses   . H/O exercise stress test 2009    [facility], normal per pt  . History of sleep apnea 04/2012    per overnight screen/oximetry   . Obesity    ROS as in subjective   Objective: BP 104/78 mmHg  Temp(Src) 97.5 F (36.4 C)  Wt 304 lb (137.893  kg)  General appearance: alert, no distress, WD/WN, AA male Skin: rough brownish rash in both palms, in between several fingers, some cracking, suggestive of contact dermatitis HEENT: normocephalic, sclerae anicteric, TMs pearly, nares with mildly swollen turbinated, areas with erythema and friable in each nare, no current bleeding.  Pharynx normal Oral cavity: MMM, no lesions Neck: supple, no lymphadenopathy, no thyromegaly, no masses Heart: RRR, normal S1, S2, no murmurs Lungs: CTA bilaterally, no wheezes, rhonchi, or rales Pulses: 2+ symmetric, upper and lower extremities, normal cap refill Ext: no edema Neuro nonfocal exam    Assessment: Encounter Diagnoses  Name Primary?  . Contact dermatitis Yes  . Epistaxis   . Chronic fatigue   . Obesity   . Need for prophylactic vaccination and inoculation against influenza       Plan: Contact dermatis - avoid Clorox wipes, avoid drying out hands.   Begin steroid cream below x 5-10 days.  If not improving, call back.  discussed avoiding prolonged use, risks/benefits of mediation.   If not improving, consider biopsy vs derm referral  Epistaxis - he consents to cautery of friable  ares in each nare.   discussed risks/benefits of procedure, used 2% lidocaine without epi in both nares for local anesthesia, used silver nitrate to cauterize areas of friability.    Pt tolerated procedure well  Chronic fatigue - labs today  Obesity - f/u pending labs consider more aggressive strategy including trainer, nutrition counseling, medications.   He request other strategies to lose weight.  Counseled on the influenza virus vaccine.  Vaccine information sheet given.  Influenza vaccine given after consent obtained.

## 2015-02-05 LAB — SSDNA ANTIBODY, IGG: ss DNA Ab: <69 U/mL (ref <230)

## 2015-02-05 LAB — ANA: Anti Nuclear Antibody(ANA): POSITIVE — AB

## 2015-02-05 LAB — ANTI-NUCLEAR AB-TITER (ANA TITER): ANA Titer 1: 1:160 {titer} — ABNORMAL HIGH

## 2015-02-09 ENCOUNTER — Other Ambulatory Visit: Payer: Self-pay | Admitting: Medical

## 2015-02-12 ENCOUNTER — Telehealth: Payer: Self-pay

## 2015-02-12 DIAGNOSIS — L259 Unspecified contact dermatitis, unspecified cause: Secondary | ICD-10-CM

## 2015-02-12 NOTE — Telephone Encounter (Signed)
Referral done by Cheri to Dr. Kellie Simmeringruslow on 02/06/15

## 2015-02-18 ENCOUNTER — Other Ambulatory Visit: Payer: Self-pay | Admitting: Medical

## 2015-07-17 ENCOUNTER — Other Ambulatory Visit: Payer: Self-pay | Admitting: Rheumatology

## 2015-07-17 DIAGNOSIS — J0191 Acute recurrent sinusitis, unspecified: Secondary | ICD-10-CM

## 2015-07-23 ENCOUNTER — Other Ambulatory Visit: Payer: PRIVATE HEALTH INSURANCE

## 2015-07-24 ENCOUNTER — Ambulatory Visit
Admission: RE | Admit: 2015-07-24 | Discharge: 2015-07-24 | Disposition: A | Discharge status: Home / Self Care | Payer: PRIVATE HEALTH INSURANCE | Source: Ambulatory Visit | Attending: Rheumatology | Admitting: Rheumatology

## 2015-07-24 DIAGNOSIS — J0191 Acute recurrent sinusitis, unspecified: Secondary | ICD-10-CM

## 2015-07-26 ENCOUNTER — Ambulatory Visit (INDEPENDENT_AMBULATORY_CARE_PROVIDER_SITE_OTHER): Payer: 59 | Admitting: Medical

## 2015-07-26 VITALS — BP 112/80 | HR 72 | Wt 308.0 lb

## 2015-07-26 DIAGNOSIS — R7989 Other specified abnormal findings of blood chemistry: Secondary | ICD-10-CM | POA: Diagnosis not present

## 2015-07-26 DIAGNOSIS — J321 Chronic frontal sinusitis: Secondary | ICD-10-CM | POA: Diagnosis not present

## 2015-07-26 DIAGNOSIS — E559 Vitamin D deficiency, unspecified: Secondary | ICD-10-CM

## 2015-07-26 DIAGNOSIS — K921 Melena: Secondary | ICD-10-CM | POA: Diagnosis not present

## 2015-07-26 DIAGNOSIS — R51 Headache: Secondary | ICD-10-CM | POA: Diagnosis not present

## 2015-07-26 DIAGNOSIS — R0602 Shortness of breath: Secondary | ICD-10-CM | POA: Diagnosis not present

## 2015-07-26 DIAGNOSIS — R5382 Chronic fatigue, unspecified: Secondary | ICD-10-CM | POA: Diagnosis not present

## 2015-07-26 DIAGNOSIS — G8929 Other chronic pain: Secondary | ICD-10-CM

## 2015-07-26 DIAGNOSIS — R942 Abnormal results of pulmonary function studies: Secondary | ICD-10-CM

## 2015-07-26 DIAGNOSIS — R519 Headache, unspecified: Secondary | ICD-10-CM

## 2015-07-26 DIAGNOSIS — R040 Epistaxis: Secondary | ICD-10-CM

## 2015-07-26 LAB — CBC WITH DIFFERENTIAL/PLATELET
Basophils Absolute: 0 10*3/uL (ref 0.0–0.1)
Basophils Relative: 1 % (ref 0–1)
Eosinophils Absolute: 0.3 10*3/uL (ref 0.0–0.7)
Eosinophils Relative: 6 % — ABNORMAL HIGH (ref 0–5)
HCT: 40.3 % (ref 39.0–52.0)
Hemoglobin: 13.9 g/dL (ref 13.0–17.0)
Lymphocytes Relative: 34 % (ref 12–46)
Lymphs Abs: 1.6 10*3/uL (ref 0.7–4.0)
MCH: 25.1 pg — ABNORMAL LOW (ref 26.0–34.0)
MCHC: 34.5 g/dL (ref 30.0–36.0)
MCV: 72.9 fL — ABNORMAL LOW (ref 78.0–100.0)
Monocytes Absolute: 0.4 10*3/uL (ref 0.1–1.0)
Monocytes Relative: 8 % (ref 3–12)
Neutro Abs: 2.4 10*3/uL (ref 1.7–7.7)
Neutrophils Relative %: 51 % (ref 43–77)
Platelets: 168 10*3/uL (ref 150–400)
RBC: 5.53 MIL/uL (ref 4.22–5.81)
RDW: 17.9 % — ABNORMAL HIGH (ref 11.5–15.5)
WBC: 4.7 10*3/uL (ref 4.0–10.5)

## 2015-07-26 LAB — TESTOSTERONE: Testosterone: 431 ng/dL (ref 250–827)

## 2015-07-26 MED ORDER — AMOXICILLIN-POT CLAVULANATE 875-125 MG PO TABS: 1.0000 | ORAL_TABLET | Freq: Two times a day (BID) | ORAL | Status: DC

## 2015-07-26 MED ORDER — PREDNISONE 10 MG PO TABS: ORAL_TABLET | ORAL | Status: DC

## 2015-07-26 NOTE — Progress Notes (Signed)
Subjective: Chief Complaint  Patient presents with  . Headache    mainly on lt side. and is not being able to sleep. said it is constant. had a ct scan that was normal. on pain scale, 9. worse at work because he works on a [employer].    Here for several concerns.  Having headaches of late, has bad headache currently.   Getting left unilateral bad headache  2-3 days per week, throbbing, but no photophobia no phonophobia.  Sometimes causes dizziness.   Denies nausea.  Gets bilat tearing at times. No numbness, tingling, vision or hearing changes.  No prior preventive headache medication. He sits in front of a computer all day.   Last saw eye doctor few months ago.   Here to f/u on issues raised with his recent consult at rheumatology with Dr. Kellie Simmering.   Had recent CT sinuses per Dr. Kellie Simmering.  He has had infrequent episodes of small amounts of BRBPR on toilet paper and in bowel, but without rectal pain, hemorrhoids, constipation.  Feels fatigue at times, episodic, and can be significant but brief feeling of extreme tiredness.      1 side of nose always seems closed.   Still having frequent nosebleeds.   Past Medical History  Diagnosis Date  . Thalassemia minor   . Wears glasses   . Eczema   . Hair loss     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  . Bipolar disorder (HCC)   . Depression   . Anxiety   . Wears glasses   . H/O exercise stress test 2009    [facility], normal per pt  . History of sleep apnea 04/2012    per overnight screen/oximetry   . Obesity    ROS as in subjective  Objective: BP 112/80 mmHg  Pulse 72  Wt 308 lb (139.708 kg)   General appearance: alert, no distress, WD/WN HEENT: normocephalic, sclerae anicteric, TMs pearly, nares patent, no discharge or erythema, pharynx normal Oral cavity: MMM, no lesions Neck: supple, no lymphadenopathy, no thyromegaly, no masses Heart: RRR, normal S1, S2, no murmurs Lungs: CTA bilaterally, no wheezes, rhonchi, or rales Abdomen: +bs,  soft, non tender, non distended, no masses, no hepatomegaly, no splenomegaly Pulses: 2+ symmetric, upper and lower extremities, normal cap refill Ext:no edema Neuro: non focal exam     Assessment: Encounter Diagnoses  Name Primary?  . Chronic intractable headache, unspecified headache type Yes  . Chronic frontal sinusitis   . Blood in stool   . Chronic fatigue   . Epistaxis   . Vitamin D deficiency   . Abnormal CBC   . Shortness of breath   . Abnormal PFT     Plan: Headaches - specific cause unclear, could be related to stress, chronic sinusitis, sleep apnea, other.   Chronic sinusitis - reviewed recent CT sinus imaging report.   Begin Augmentin and Prednisone Blood in stool - consider GI referral pending labs Fatigue - additional labs today, consider other causes including OSA, cardiac or pulmonary related Epistaxis - referred to ENT Vit D deficiency - compliant with medication, recheck labs today abnormal CBC - recheck lab today SOB - prior abnormal PFTs.  Referral to pulmonology for OSA, fatigue, abnormal PFTs, SOB.  Kilan was seen today for headache.  Diagnoses and all orders for this visit:  Chronic intractable headache, unspecified headache type  Chronic frontal sinusitis -     amoxicillin-clavulanate (AUGMENTIN) 875-125 MG tablet; Take 1 tablet by mouth 2 (two) times daily. -  predniSONE (DELTASONE) 10 MG tablet; 6/5/4/3/2/1  Blood in stool -     CBC with Differential/Platelet  Chronic fatigue -     Testosterone  Epistaxis -     Ambulatory referral to ENT  Vitamin D deficiency -     VITAMIN D 25 Hydroxy (Vit-D Deficiency, Fractures)  Abnormal CBC -     CBC with Differential/Platelet  Shortness of breath -     Ambulatory referral to Pulmonology  Abnormal PFT -     Ambulatory referral to Pulmonology

## 2015-07-27 LAB — VITAMIN D 25 HYDROXY (VIT D DEFICIENCY, FRACTURES): Vit D, 25-Hydroxy: 33 ng/mL (ref 30–100)

## 2015-07-29 ENCOUNTER — Telehealth: Payer: Self-pay | Admitting: Medical

## 2015-07-29 NOTE — Telephone Encounter (Signed)
Called pt and left message advising that FMLA papers are completed however pt's portion will need to be completed and we can fax the forms if he wants us to do so

## 2015-08-12 ENCOUNTER — Telehealth: Payer: Self-pay

## 2015-08-12 NOTE — Telephone Encounter (Signed)
Pt called and stated that his FMLA was received and stated he would like you to refax page four and change his "days" from 2 day into 3 days. They didn't do his FMLA with hours but by days so that is why he would like the days changed.

## 2015-08-14 NOTE — Telephone Encounter (Signed)
I can't approve 3 days out of work a month.   I have referred him to ENT and pulmonology for other evaluation to help give him answers, and he is suppose to have f/u with Dr. Kellie Simmeringruslow as well.   If they feel he needs more than 2 days out per month, then they can help with the FMLA paperwork, but without a specific disability or definite disease process such as a week's hospitalization or surgery or something that puts him out of work for a period of time,unfortunately I can't just write 3 days out of work every month.

## 2015-08-14 NOTE — Telephone Encounter (Signed)
LMTCB

## 2015-08-14 NOTE — Telephone Encounter (Signed)
Pt is aware.  

## 2015-08-20 ENCOUNTER — Institutional Professional Consult (permissible substitution): Payer: PRIVATE HEALTH INSURANCE | Admitting: Internal Medicine

## 2015-08-27 ENCOUNTER — Other Ambulatory Visit: Payer: Self-pay | Admitting: Medical

## 2015-09-03 ENCOUNTER — Other Ambulatory Visit: Payer: Self-pay | Admitting: Medical

## 2015-09-03 NOTE — Telephone Encounter (Signed)
Is this ok to refill? Not on pts listed medications

## 2015-09-04 ENCOUNTER — Encounter: Payer: Self-pay | Admitting: Internal Medicine

## 2015-09-04 ENCOUNTER — Ambulatory Visit (INDEPENDENT_AMBULATORY_CARE_PROVIDER_SITE_OTHER): Payer: PRIVATE HEALTH INSURANCE | Admitting: Internal Medicine

## 2015-09-04 ENCOUNTER — Other Ambulatory Visit (INDEPENDENT_AMBULATORY_CARE_PROVIDER_SITE_OTHER): Payer: PRIVATE HEALTH INSURANCE

## 2015-09-04 VITALS — BP 126/82 | HR 102 | Ht 75.0 in | Wt 307.4 lb

## 2015-09-04 DIAGNOSIS — R0600 Dyspnea, unspecified: Secondary | ICD-10-CM | POA: Diagnosis not present

## 2015-09-04 DIAGNOSIS — R058 Other specified cough: Secondary | ICD-10-CM

## 2015-09-04 DIAGNOSIS — J45991 Cough variant asthma: Secondary | ICD-10-CM | POA: Diagnosis not present

## 2015-09-04 DIAGNOSIS — R05 Cough: Secondary | ICD-10-CM

## 2015-09-04 LAB — CBC WITH DIFFERENTIAL/PLATELET
Basophils Absolute: 0.1 10*3/uL (ref 0.0–0.1)
Basophils Relative: 0.8 % (ref 0.0–3.0)
Eosinophils Absolute: 0.4 10*3/uL (ref 0.0–0.7)
Eosinophils Relative: 5.2 % — ABNORMAL HIGH (ref 0.0–5.0)
HCT: 43 % (ref 39.0–52.0)
Hemoglobin: 14.4 g/dL (ref 13.0–17.0)
Lymphocytes Relative: 26 % (ref 12.0–46.0)
Lymphs Abs: 1.9 10*3/uL (ref 0.7–4.0)
MCHC: 33.5 g/dL (ref 30.0–36.0)
MCV: 72.3 fl — ABNORMAL LOW (ref 78.0–100.0)
Monocytes Absolute: 0.9 10*3/uL (ref 0.1–1.0)
Monocytes Relative: 12.2 % — ABNORMAL HIGH (ref 3.0–12.0)
Neutro Abs: 4.1 10*3/uL (ref 1.4–7.7)
Neutrophils Relative %: 55.8 % (ref 43.0–77.0)
Platelets: 267 10*3/uL (ref 150.0–400.0)
RBC: 5.94 Mil/uL — ABNORMAL HIGH (ref 4.22–5.81)
RDW: 16.6 % — ABNORMAL HIGH (ref 11.5–15.5)
WBC: 7.3 10*3/uL (ref 4.0–10.5)

## 2015-09-04 LAB — NITRIC OXIDE: Nitric Oxide: 56

## 2015-09-04 MED ORDER — MOMETASONE FURO-FORMOTEROL FUM 100-5 MCG/ACT IN AERO: INHALATION_SPRAY | RESPIRATORY_TRACT | Status: DC

## 2015-09-04 NOTE — Progress Notes (Signed)
Subjective:    Patient ID: Devin Erickson, male    DOB: 10-05-1970,    MRN: [id number]  HPI  45 yobm never smoker grew up in [place] with springtime  rhinitis since childhood saw allergist/ placed on shots x one year ? Benefit moved to [place] age 45 with same pattern until  2013 more year round pattern maybe a little better in winter but never really resolved and assoc with sense of doe so referred to pulmonary clinic 09/04/2015 by Boone Master   09/04/2015 1st [facility] office visit/ Wert   Chief Complaint  Patient presents with  . Pulmonary Consult    Referred by Dr. Aleen Campi. Pt c/o SOB since Dec 2016. He notices when he is talking. He also c/o non prod cough and sore throat that mainly only bother him at night.   developed allopecia universalis in 2013 which occurred about the same time more of a problem with breathing and variable cough/ sore throat. Generally not sob at rest/ wears cpap machine and some sob/cough at hs and wakes up in am in general ok until/unless rhinitis active and then recurs esp talking / always sob  with heavy exertion like treadmill ex  HA sporadic/ day and night tends to be in temples x one hour x two years.   eval by ent doctor > ? Sinuses/ never saw allergy  rx for acid reflux x 8 years prior to OV  No better rx saba no benefit  rx singulair no perceived benefit   No obvious other patterns in day to day or daytime variabilty or assoc excess/ purulent sputum or mucus plugs   or cp or chest tightness, subjective wheeze overt sinus or hb symptoms. No unusual exp hx or h/o childhood pna/ asthma or knowledge of premature birth.  Sleeping ok without nocturnal  or early am exacerbation  of respiratory  c/o's or need for noct saba. Also denies any obvious fluctuation of symptoms with weather or environmental changes or other aggravating or alleviating factors except as outlined above   Current Medications, Allergies, Complete Past Medical History, Past  Surgical History, Family History, and Social History were reviewed in [employer] record.                Review of Systems  Constitutional: Negative for fever, chills, activity change, appetite change and unexpected weight change.  HENT: Positive for congestion and sore throat. Negative for dental problem, postnasal drip, rhinorrhea, sneezing, trouble swallowing and voice change.   Eyes: Negative for visual disturbance.  Respiratory: Positive for cough and shortness of breath. Negative for choking.   Cardiovascular: Negative for chest pain and leg swelling.  Gastrointestinal: Negative for nausea, vomiting and abdominal pain.  Genitourinary: Negative for difficulty urinating.  Musculoskeletal: Negative for arthralgias.  Skin: Negative for rash.  Neurological: Positive for headaches.  Psychiatric/Behavioral: Negative for behavioral problems and confusion.       Objective:   Physical Exam  Wt Readings from Last 3 Encounters:  09/04/15 307 lb 6.4 oz (139.436 kg)  07/26/15 308 lb (139.708 kg)  02/01/15 304 lb (137.893 kg)    Vital signs reviewed    HEENT: nl dentition,   and oropharynx. Nl external ear canals without cough reflex - moderate bilateral non-specific turbinate edema  L>R    NECK :  without JVD/Nodes/TM/ nl carotid upstrokes bilaterally   LUNGS: no acc muscle use,  Nl contour chest which is clear to A and P bilaterally without cough on insp  or exp maneuvers   CV:  RRR  no s3 or murmur or increase in P2, no edema   ABD:  soft and nontender with nl inspiratory excursion in the supine position. No bruits or organomegaly, bowel sounds nl  MS:  Nl gait/ ext warm without deformities, calf tenderness, cyanosis or clubbing No obvious joint restrictions   SKIN: warm and dry without lesions    NEURO:  alert, approp, nl sensorium with  no motor deficits     Labs ordered 09/04/2015 /  Cbc with diff/ allergy profile        Assessment & Plan:

## 2015-09-04 NOTE — Patient Instructions (Addendum)
Dulera 100 Take 2 puffs first thing in am and then another 2 puffs about 12 hours later. - fill the prescription   Work on inhaler technique:  relax and gently blow all the way out then take a nice smooth deep breath back in, triggering the inhaler at same time you start breathing in.  Hold for up to 5 seconds if you can. Blow out thru nose. Rinse and gargle with water when done      Please remember to go to the lab  department downstairs for your tests - we will call you with the results when they are available.  GERD (REFLUX)  is an extremely common cause of respiratory symptoms just like yours , many times with no obvious heartburn at all.    It can be treated with medication, but also with lifestyle changes including elevation of the head of your bed (ideally with 6 inch  bed blocks),  Smoking cessation, avoidance of late meals, excessive alcohol, and avoid fatty foods, chocolate, peppermint, colas, red wine, and acidic juices such as orange juice.  NO MINT OR MENTHOL PRODUCTS SO NO COUGH DROPS  USE SUGARLESS CANDY INSTEAD (Jolley ranchers or Stover's or Life Savers) or even ice chips will also do - the key is to swallow to prevent all throat clearing. NO OIL BASED VITAMINS - use powdered substitutes.  If not satisfied, add Try prilosec otc 20mg   Take 30-60 min before first meal of the day and Pepcid ac (famotidine) 20 mg one @  bedtime  And we will methacholine challenge after being on the medications for 2 weeks.  Please schedule a follow up office visit in 6 weeks, call sooner if needed

## 2015-09-05 DIAGNOSIS — R058 Other specified cough: Secondary | ICD-10-CM | POA: Insufficient documentation

## 2015-09-05 DIAGNOSIS — R05 Cough: Secondary | ICD-10-CM | POA: Insufficient documentation

## 2015-09-05 LAB — RESPIRATORY ALLERGY PROFILE REGION II ~~LOC~~
Allergen, Cedar tree, t12: 0.41 kU/L — ABNORMAL HIGH
Allergen, Comm Silver Birch, t9: 0.3 kU/L — ABNORMAL HIGH
Allergen, Cottonwood, t14: <0.1 kU/L
Allergen, D pternoyssinus,d7: 1.34 kU/L — ABNORMAL HIGH
Allergen, Mouse Urine Protein, e78: <0.1 kU/L
Allergen, Mulberry, t76: <0.1 kU/L
Allergen, Oak,t7: 0.16 kU/L — ABNORMAL HIGH
Alternaria Alternata: <0.1 kU/L
Aspergillus fumigatus, m3: <0.1 kU/L
Bermuda Grass: 3.72 kU/L — ABNORMAL HIGH
Box Elder IgE: 0.54 kU/L — ABNORMAL HIGH
Cat Dander: 0.47 kU/L — ABNORMAL HIGH
Cladosporium Herbarum: <0.1 kU/L
Cockroach: 0.43 kU/L — ABNORMAL HIGH
Common Ragweed: 0.28 kU/L — ABNORMAL HIGH
D. farinae: 1.74 kU/L — ABNORMAL HIGH
Dog Dander: 0.3 kU/L — ABNORMAL HIGH
Elm IgE: 0.1 kU/L — ABNORMAL HIGH
IgE (Immunoglobulin E), Serum: 447 kU/L — ABNORMAL HIGH (ref <115)
Johnson Grass: 3.6 kU/L — ABNORMAL HIGH
Pecan/Hickory Tree IgE: 3.58 kU/L — ABNORMAL HIGH
Penicillium Notatum: <0.1 kU/L
Rough Pigweed  IgE: 0.14 kU/L — ABNORMAL HIGH
Sheep Sorrel IgE: 0.3 kU/L — ABNORMAL HIGH
Timothy Grass: 12 kU/L — ABNORMAL HIGH

## 2015-09-05 NOTE — Assessment & Plan Note (Signed)
Sinus CT 07/24/15 > Mild chronic ethmoid and maxillary sinus disease. No layering fluid. No OMU obstruction. Spirometry 09/04/2015  wnl - NO  09/04/2015  56 - 09/04/2015  After extensive coaching HFA effectiveness =    75% from a baseline of 25% so rec trial of dulera 100 2bid x 2 week sample only   Symptoms are markedly disproportionate to objective findings and not clear this is a lung problem but pt does appear to have difficult airway management issues.  DDX of  difficult airways management almost all start with A and  include Adherence, Ace Inhibitors, Acid Reflux, Active Sinus Disease, Alpha 1 Antitripsin deficiency, Anxiety masquerading as Airways dz,  ABPA,  Allergy(esp in young), Aspiration (esp in elderly), Adverse effects of meds,  Active smokers, A bunch of PE's (a small clot burden can't cause this syndrome unless there is already severe underlying pulm or vascular dz with poor reserve) plus two Bs  = Bronchiectasis and Beta blocker use..and one C= CHF  Adherence is always the initial "prime suspect" and is a multilayered concern that requires a "trust but verify" approach in every patient - starting with knowing how to use medications, especially inhalers, correctly, keeping up with refills and understanding the fundamental difference between maintenance and prns vs those medications only taken for a very short course and then stopped and not refilled.   -- The proper method of use, as well as anticipated side effects, of a metered-dose inhaler are discussed and demonstrated to the patient. Improved effectiveness after extensive coaching during this visit to a level of approximately 75 % from a baseline of 50 %   ? Allergy > suggested by moderate increase in NO > check profile  ? Acid (or non-acid) GERD > always difficult to exclude as up to 75% of pts in some series report no assoc GI/ Heartburn symptoms> rec max diet restrictions/ reviewed and instructions given - if cough and sorethroat not  improving add ppi q am / h2 hs next  ? Active sinus dz > doubt with neg sinus ct while symptomatic  ? Anxiety >  Certainly on the ddx esp since the only time he is sob is talkng vs treadmill with nothing in between.   Total time devoted to counseling  = 35/4532m review case with pt/ discussion of options/alternatives/ personally creating in presence of pt  then going over specific  Instructions directly with the pt including how to use all of the meds but in particular covering each new medication in detail (see avs)

## 2015-09-05 NOTE — Assessment & Plan Note (Signed)
Hx of sob only speaking and with aerobics is very unusual and suggest anxiety vs uacs component for the former and deconditioning for the latter but may need methacholine challenge or cpst before or after spirometry to sort out.

## 2015-09-05 NOTE — Assessment & Plan Note (Signed)
Same ddx as dtca with the exception that we need to avoid dpi's and high dose ics as they contribute more to airway instability and really follow the gerd diet carefully due to cyclical cough risk separate from cough variant asthma but both can be destabilized by gerd in any form.

## 2015-09-09 ENCOUNTER — Telehealth: Payer: Self-pay | Admitting: Internal Medicine

## 2015-09-09 NOTE — Telephone Encounter (Signed)
Notes Recorded by Nyoka CowdenMichael B Wert, MD on 09/09/2015 at 1:00 PM Call patient : Studies are c/w allergies to dust, ragweed, trees, grass cats > be sure has f/u ov to discuss in more detail  LMTCB

## 2015-09-09 NOTE — Progress Notes (Signed)
Quick Note:  LMTCB ______ 

## 2015-09-10 NOTE — Telephone Encounter (Signed)
lmtcb X2 for pt.  

## 2015-09-10 NOTE — Telephone Encounter (Signed)
Spoke with pt and gave lab results. Pt rescheduled f/u to be seen sooner. Nothing further needed.

## 2015-09-10 NOTE — Telephone Encounter (Signed)
Pt returning call.Devin Erickson ° °

## 2015-10-01 ENCOUNTER — Other Ambulatory Visit: Payer: Self-pay | Admitting: Medical

## 2015-10-04 ENCOUNTER — Ambulatory Visit (INDEPENDENT_AMBULATORY_CARE_PROVIDER_SITE_OTHER): Payer: 59 | Admitting: Internal Medicine

## 2015-10-04 ENCOUNTER — Encounter: Payer: Self-pay | Admitting: Internal Medicine

## 2015-10-04 VITALS — BP 118/76 | HR 69 | Ht 75.0 in | Wt 308.0 lb

## 2015-10-04 DIAGNOSIS — G44229 Chronic tension-type headache, not intractable: Secondary | ICD-10-CM | POA: Diagnosis not present

## 2015-10-04 DIAGNOSIS — J45991 Cough variant asthma: Secondary | ICD-10-CM

## 2015-10-04 MED ORDER — MOMETASONE FURO-FORMOTEROL FUM 200-5 MCG/ACT IN AERO: INHALATION_SPRAY | RESPIRATORY_TRACT | Status: DC

## 2015-10-04 MED ORDER — BUTALBITAL-APAP-CAFFEINE 50-325-40 MG PO TABS: 1.0000 | ORAL_TABLET | Freq: Two times a day (BID) | ORAL | Status: DC | PRN

## 2015-10-04 NOTE — Assessment & Plan Note (Addendum)
Sinus CT 07/24/15 > Mild chronic ethmoid and maxillary sinus disease. No layering fluid. No OMU obstruction. Spirometry 09/04/2015  wnl - NO  09/04/2015  56 - 09/04/2015  After extensive coaching HFA effectiveness =    75% from a baseline of 25% so rec trial of dulera 100 2bid x 2 week sample only  - Allergy profile No visit date found.09/04/15  >  Eos 0. /  IgE  447  Dust, Ragweed grass, trees, cats - 10/04/2015  After extensive coaching HFA effectiveness =    = 90%    Not needing albuterol but still some cough/ wheeze and clearly has allergic asthma component when consider response to low dose ics in setting of pos allergy tests/NO of 56  rec combine dulera 200  2 bid and singulair x one month and if 100% then try off singulair  If not 100% on this combination then next step is allergy referral  I had an extended discussion with the patient reviewing all relevant studies completed to date and  lasting 15 to 20 minutes of a 25 minute visit    Each maintenance medication was reviewed in detail including most importantly the difference between maintenance and prns and under what circumstances the prns are to be triggered using an action plan format that is not reflected in the computer generated alphabetically organized AVS.    Please see instructions for details which were reviewed in writing and the patient given a copy highlighting the part that I personally wrote and discussed at today's ov.

## 2015-10-04 NOTE — Progress Notes (Signed)
Subjective:    Patient ID: Devin Erickson, male    DOB: 20-Jul-1970    MRN: [id number]    Brief patient profile:  45 yobm never smoker grew up in [place] with springtime  rhinitis since childhood saw allergist/ placed on shots x one year ? Benefit moved to [place] age 45 with same pattern until  2013 more year round pattern maybe a little better in winter but never really resolved and assoc with sense of doe so referred to pulmonary clinic 09/04/2015 by Boone Master    Brief patient profile:  09/04/2015 1st [facility] office visit/ Wert  singulair  Chief Complaint  Patient presents with  . Pulmonary Consult    Referred by Dr. Aleen Campi. Pt c/o SOB since Dec 2016. He notices when he is talking. He also c/o non prod cough and sore throat that mainly only bother him at night.   developed allopecia universalis in 2013 which occurred about the same time more of a problem with breathing and variable cough/ sore throat. Generally not sob at rest/ wears cpap machine and some sob/cough at hs and wakes up in am in general ok until/unless rhinitis active and then recurs esp talking / always sob  with heavy exertion like treadmill ex  HA sporadic/ day and night tends to be in temples x one hour x two years.   eval by ent doctor > ? Sinuses/ never saw allergy  rx for acid reflux x 8 years prior to OV  No better rx saba no benefit  rx singulair no perceived benefit rec Dulera 100 Take 2 puffs first thing in am and then another 2 puffs about 12 hours later. - fill the prescription  Work on inhaler technique:  GERD diet  If not satisfied, add Try prilosec otc   Take 30-60 min before first meal of the day and Pepcid ac (famotidine) 20 mg one @  bedtime    10/04/2015  f/u ov/Wert re: cough variant asthma  On dulera 100/singulair  Chief Complaint  Patient presents with  . Follow-up    Breathing has improved, but not back at his normal baseline yet.  Cough is much improved and is non prod.  He has  occ wheezing. He has not needed albuterol since the last visit.    No obvious day to day or daytime variability or assoc excess/ purulent sputum or mucus plugs or hemoptysis or cp or chest tightness, subjective wheeze or overt sinus or hb symptoms. No unusual exp hx or h/o childhood pna/ asthma or knowledge of premature birth.  Sleeping ok without nocturnal  or early am exacerbation  of respiratory  c/o's or need for noct saba. Also denies any obvious fluctuation of symptoms with weather or environmental changes or other aggravating or alleviating factors except as outlined above   Current Medications, Allergies, Complete Past Medical History, Past Surgical History, Family History, and Social History were reviewed in [employer] record.  ROS  The following are not active complaints unless bolded sore throat, dysphagia, dental problems, itching, sneezing,  nasal congestion or excess/ purulent secretions, ear ache,   fever, chills, sweats, unintended wt loss, classically pleuritic or exertional cp,  orthopnea pnd or leg swelling, presyncope, palpitations, abdominal pain, anorexia, nausea, vomiting, diarrhea  or change in bowel or bladder habits, change in stools or urine, dysuria,hematuria,  rash, arthralgias, visual complaints, headache bitemporal daily about 1-2 2pm, numbness, weakness or ataxia or problems with walking or coordination,  change in mood/affect  or memory.                             Objective:   Physical Exam  10/04/2015         308   09/04/15 307 lb 6.4 oz (139.436 kg)  07/26/15 308 lb (139.708 kg)  02/01/15 304 lb (137.893 kg)    Vital signs reviewed    HEENT: nl dentition,   and oropharynx. Nl external ear canals without cough reflex - moderate bilateral non-specific turbinate edema  L>R    NECK :  without JVD/Nodes/TM/ nl carotid upstrokes bilaterally   LUNGS: no acc muscle use,  Nl contour chest which is clear to A and P bilaterally without  cough on insp or exp maneuvers   CV:  RRR  no s3 or murmur or increase in P2, no edema   ABD:  soft and nontender with nl inspiratory excursion in the supine position. No bruits or organomegaly, bowel sounds nl  MS:  Nl gait/ ext warm without deformities, calf tenderness, cyanosis or clubbing No obvious joint restrictions   SKIN: warm and dry without lesions    NEURO:  alert, approp, nl sensorium with  no motor deficits          Assessment & Plan:

## 2015-10-04 NOTE — Patient Instructions (Signed)
Try dulera 200 Take 2 puffs first thing in am and then another 2 puffs about 12 hours later.   Stay on singulair x one more month and if 100% better then try off it   Keep cat out of bedroom - if not better to your satisfaction in one month I can send you to an allergist  Either stop all caffeine (100%) or take esgeic (which has caffeine) at onset of headache to see if it knocks it out in which case it is a classic tension headache and ok to take the esgeic as needed or let Boone MasterSean Tysinger refer you to a Headache specialist  Please schedule a follow up visit in 3 months but call sooner if needed

## 2015-10-05 ENCOUNTER — Encounter: Payer: Self-pay | Admitting: Internal Medicine

## 2015-10-05 ENCOUNTER — Other Ambulatory Visit: Payer: Self-pay | Admitting: Medical

## 2015-10-05 NOTE — Assessment & Plan Note (Addendum)
Onset around 2015  Sinus CT 07/24/15 > nl - trial of esgeic 10/04/2015 >>>  Classic pm ha in pattern of tension > either needs to avoid caffeine 100% or try esgeic but either way if ha not relieved > ha clinic next

## 2015-10-21 ENCOUNTER — Ambulatory Visit: Payer: PRIVATE HEALTH INSURANCE | Admitting: Internal Medicine

## 2015-10-25 ENCOUNTER — Ambulatory Visit: Payer: PRIVATE HEALTH INSURANCE | Admitting: Internal Medicine

## 2015-10-28 ENCOUNTER — Other Ambulatory Visit: Payer: Self-pay | Admitting: Medical

## 2015-10-28 NOTE — Telephone Encounter (Signed)
Is this okay to refill? 

## 2015-12-01 ENCOUNTER — Other Ambulatory Visit: Payer: Self-pay | Admitting: Medical

## 2015-12-12 ENCOUNTER — Ambulatory Visit (INDEPENDENT_AMBULATORY_CARE_PROVIDER_SITE_OTHER): Payer: 59 | Admitting: Medical

## 2015-12-12 ENCOUNTER — Encounter: Payer: Self-pay | Admitting: Medical

## 2015-12-12 VITALS — BP 120/78 | HR 88 | Wt 317.0 lb

## 2015-12-12 DIAGNOSIS — R21 Rash and other nonspecific skin eruption: Secondary | ICD-10-CM

## 2015-12-12 DIAGNOSIS — L209 Atopic dermatitis, unspecified: Secondary | ICD-10-CM | POA: Diagnosis not present

## 2015-12-12 DIAGNOSIS — G4733 Obstructive sleep apnea (adult) (pediatric): Secondary | ICD-10-CM

## 2015-12-12 DIAGNOSIS — E669 Obesity, unspecified: Secondary | ICD-10-CM | POA: Diagnosis not present

## 2015-12-12 MED ORDER — CLOTRIMAZOLE-BETAMETHASONE 1-0.05 % EX CREA: 1.0000 "application " | TOPICAL_CREAM | Freq: Two times a day (BID) | CUTANEOUS | 0 refills | Status: DC

## 2015-12-12 MED ORDER — NALTREXONE-BUPROPION HCL ER 8-90 MG PO TB12: 2.0000 | ORAL_TABLET | Freq: Two times a day (BID) | ORAL | 1 refills | Status: DC

## 2015-12-12 NOTE — Progress Notes (Signed)
Subjective: Chief Complaint  Patient presents with  . Rash    in palms of hands. just flared back up. Sleep apnea: having trouble sleeping and with machine he can sleep but if he tries to take a nap he will find himself "snorting" awake. has been not feeling good recently has no energy. not able to lose weight and he hits the gym 4x a week and has lost nothing.    Here for several concerns.  We have seen him prior for rash of both hands, worse current on right on palms and in between fingers.   The prior cream betamethasone helps but it keeps coming back, wont go completley away.  He did quit the Clorox wipes he was using prior.  He sometimes uses alcohol wipes,but mostly soap and water  OSA - compliant with CPAP at night but not in the day or on a weekend afternoon with a nap.   During day time cat naps, wife says he still quits breathing.  He hasn't been using CPAP for small naps  Obesity - exercising regularly, 4-5 gym days per week, eating healthy, sees trainer, wants to begin medication. Not interested in weight loss surgery.  Can't seem to lose the weight on his own.   Past Medical History:  Diagnosis Date  . Anxiety   . Bipolar disorder (HCC)   . Depression   . Eczema   . H/O exercise stress test 2009   [facility], normal per pt  . Hair loss    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  . History of sleep apnea 04/2012   per overnight screen/oximetry   . Obesity   . Thalassemia minor   . Wears glasses   . Wears glasses    ROS as in subjective    Objective: BP 120/78   Pulse 88   Wt (!) 317 lb (143.8 kg)   BMI 39.62 kg/m   Gen: wd, wn, nad Heart RRR, normal s1, s2, no murmurs lungs clear Ext: no edema Pulses normal Oral airway is small, tongue prominent, can't even visualize the tonsils Right palm with rough skin patch, in between a few fingers on right hand are some areas of whitish maceration, break in the skin, other rough patches on dorsal MCPs bilat hands UE  neurovascularly intact    Assessment: Encounter Diagnoses  Name Primary?  . OSA (obstructive sleep apnea) Yes  . Obesity   . Atopic dermatitis   . Rash and nonspecific skin eruption     Plan: OSA - c/t CPAP, work on weight loss Obesity - begin trial of Contrave.  discussed risks/ benefits of medication, continue working with the trainer, c/t health diet, exercise, and set goals. F/u 4-6 wk Atopic dermatitis, rash - begin trial of Lotrisone ointment.  If not resolved within a few weeks, then refer to dermatology   Devin Erickson was seen today for rash.  Diagnoses and all orders for this visit:  OSA (obstructive sleep apnea)  Obesity  Atopic dermatitis  Rash and nonspecific skin eruption  Other orders -     clotrimazole-betamethasone (LOTRISONE) cream; Apply 1 application topically 2 (two) times daily. -     Naltrexone-Bupropion HCl ER 8-90 MG TB12; Take 2 tablets by mouth 2 (two) times daily with a meal.

## 2015-12-12 NOTE — Patient Instructions (Signed)
Rash  

## 2015-12-17 ENCOUNTER — Telehealth: Payer: Self-pay

## 2015-12-17 NOTE — Telephone Encounter (Signed)
Approved PA obtained on Contrave from 11/17/2015- 04/15/2016. Prescription cost is $35.00 month. LM for pt to CB to notify him of cost.

## 2015-12-17 NOTE — Telephone Encounter (Signed)
Pt notified of  PA status Trixie Rude/RLB

## 2015-12-24 ENCOUNTER — Other Ambulatory Visit: Payer: Self-pay | Admitting: Medical

## 2015-12-24 DIAGNOSIS — R0600 Dyspnea, unspecified: Secondary | ICD-10-CM

## 2015-12-25 NOTE — Telephone Encounter (Signed)
Is this ok to refill?  

## 2015-12-28 ENCOUNTER — Other Ambulatory Visit: Payer: Self-pay | Admitting: Medical

## 2016-01-23 ENCOUNTER — Ambulatory Visit (INDEPENDENT_AMBULATORY_CARE_PROVIDER_SITE_OTHER): Payer: 59 | Admitting: Internal Medicine

## 2016-01-23 ENCOUNTER — Encounter: Payer: Self-pay | Admitting: Internal Medicine

## 2016-01-23 VITALS — BP 114/82 | HR 74 | Ht 75.0 in | Wt 311.2 lb

## 2016-01-23 DIAGNOSIS — J45991 Cough variant asthma: Secondary | ICD-10-CM

## 2016-01-23 LAB — NITRIC OXIDE: Nitric Oxide: 31

## 2016-01-23 MED ORDER — MOMETASONE FURO-FORMOTEROL FUM 200-5 MCG/ACT IN AERO: INHALATION_SPRAY | RESPIRATORY_TRACT | 11 refills | Status: DC

## 2016-01-23 NOTE — Patient Instructions (Signed)
Plan A = Automatic =  Dulera 200 Take 2 puffs first thing in am and then another 2 puffs about 12 hours later.    Plan B = Backup Only use your albuterol as a rescue medication to be used if you can't catch your breath by resting or doing a relaxed purse lip breathing pattern.  - The less you use it, the better it will work when you need it. - Ok to use the inhaler up to 2 puffs  every 4 hours if you must but call for appointment if use goes up over your usual need - Don't leave home without it !!  (think of it like the spare tire for your car)    Please schedule a follow up visit in 6  months but call sooner if needed

## 2016-01-23 NOTE — Progress Notes (Signed)
Subjective:    Patient ID: Devin Erickson, male    DOB: 1970-12-09    MRN: [id number]    Brief patient profile:  [id number] yobm never smoker [place] with springtime  rhinitis since childhood saw allergist/ placed on shots x one year ? Benef[employer] moved to [place] age 45 with same pattern until  2013 more year round pattern maybe a little better in winter but never really resolved and assoc with sense of doe so referred to pulmonary clinic 09/04/2015 by Bo[place] MasterSean Tysinger    Brief patient profile:  09/04/2015 1st [facility] office visit/ Wert  singulair  Chief Complaint  Patient presents with  . Pulmonary Consult    Referred by Dr. Aleen Campiysinger. Pt c/o SOB since Dec 2016. He notices when he is talking. He also c/o non prod cough and sore throat that mainly only bother him at night.   developed allopecia universalis in 2013 which occurred about the same time more of a problem with breathing and variable cough/ sore throat. Generally not sob at rest/ wears cpap machine and some sob/cough at hs and wakes up in am in general ok until/unless rhinitis active and then recurs esp talking / always sob  with heavy exertion like treadmill ex  HA sporadic/ day and night tends to be in temples x one hour x two years.   eval by ent doctor > ? Sinuses/ never saw allergy  rx for acid reflux x 8 years prior to OV  No better rx saba no benefit  rx singulair no perceived benefit rec Dulera 100 Take 2 puffs first thing in am and then another 2 puffs about 12 hours later. - fill the prescription  Work on inhaler technique:  GERD diet  If not satisfied, add Try prilosec otc 20mg   Take 30-60 min before first meal of the day and Pepcid ac (famotidine) 20 mg one @  bedtime    10/04/2015  f/u ov/Wert re: cough variant asthma  On dulera 100/singulair  Chief Complaint  Patient presents with  . Follow-up    Breathing has improved, but not back at his normal baseline yet.  Cough is much improved and is non prod.  He has  occ wheezing. He has not needed albuterol since the last visit.   rec Try dulera 200 Take 2 puffs first thing in am and then another 2 puffs about 12 hours later.  Stay on singulair x one more month and if 100% better then try off it  Keep cat out of bedroom - if not better to your satisfaction in one month I can send you to an allergist Either stop all caffeine (100%) or take esgeic (which has caffeine) at onset of headache to see if it knocks it out in which case it is a classic tension headache and ok to take the esgeic as needed or let Boone MasterSean Tysinger refer you to a Headache specialist    01/23/2016  f/u ov/Wert re: cough variant asthma/ dulera 200 / stopped  singulair and no worse  Chief Complaint  Patient presents with  . Follow-up    Pt. states [facility] is [facility], no coughing, no sob, no wheezing, no chest pain or tightness  no saba at all/ not in pocket Bad week one week prior to OV  But even then did not rx with saba   No obvious day to day or daytime variability or assoc excess/ purulent sputum or mucus plugs or hemoptysis or  cp or chest tightness, subjective wheeze or overt sinus or hb symptoms. No unusual exp hx or h/o childhood pna/ asthma or knowledge of premature birth.  Sleeping ok without nocturnal  or early am exacerbation  of respiratory  c/o's or need for noct saba. Also denies any obvious fluctuation of symptoms with weather or environmental changes or other aggravating or alleviating factors except as outlined above   Current Medications, Allergies, Complete Past Medical History, Past Surgical History, Family History, and Social History were reviewed in [employer] record.  ROS  The following are not active complaints unless bolded sore throat, dysphagia, dental problems, itching, sneezing,  nasal congestion or excess/ purulent secretions, ear ache,   fever, chills, sweats, unintended wt loss, classically pleuritic or exertional cp,   orthopnea pnd or leg swelling, presyncope, palpitations, abdominal pain, anorexia, nausea, vomiting, diarrhea  or change in bowel or bladder habits, change in stools or urine, dysuria,hematuria,  rash, arthralgias, visual complaints, headache resolved, numbness, weakness or ataxia or problems with walking or coordination,  change in mood/affect or memory.               Objective:   Physical Exam   Pleasant amb bm nad  01/23/2016       311 10/04/2015         308   09/04/15 307 lb 6.4 oz (139.436 kg)  07/26/15 308 lb (139.708 kg)  02/01/15 304 lb (137.893 kg)    Vital signs reviewed    HEENT: nl dentition,   and oropharynx. Nl external ear canals without cough reflex - moderate bilateral non-specific turbinate edema  L>R    NECK :  without JVD/Nodes/TM/ nl carotid upstrokes bilaterally   LUNGS: no acc muscle use,  Nl contour chest which is clear to A and P bilaterally without cough on insp or exp maneuvers   CV:  RRR  no s3 or murmur or increase in P2, no edema   ABD:  soft and nontender with nl inspiratory excursion in the supine position. No bruits or organomegaly, bowel sounds nl  MS:  Nl gait/ ext warm without deformities, calf tenderness, cyanosis or clubbing No obvious joint restrictions   SKIN: warm and dry without lesions    NEURO:  alert, approp, nl sensorium with  no motor deficits          Assessment & Plan:

## 2016-01-24 NOTE — Assessment & Plan Note (Addendum)
Sinus CT 07/24/15 > Mild chronic ethmoid and maxillary sinus disease. No layering fluid. No OMU obstruction. Spirometry 09/04/2015  wnl - NO  09/04/2015  54 - 09/04/2015  After extensive coaching HFA effectiveness =    75% from a baseline of 25% so rec trial of dulera 100 2bid x 2 week sample only  - Allergy profile No visit date found.09/04/15  >  Eos 0.4/  IgE  447  Dust, Ragweed grass, trees, cats - 10/04/2015  After extensive coaching HFA effectiveness =    = 90% > try dulera 200 2bid - FENO 01/23/2016  =   31 on dulera 100 2bid (as supposed to be on 200) > increased to 200 2bid    Despite misunderstanding re which strength of ics, All goals of chronic asthma control met including optimal function and elimination of symptoms with minimal need for rescue therapy.  Contingencies discussed in full including contacting this office immediately if not controlling the symptoms using the rule of two's.     Will change to the 200 2bid for now based on feno > 25 and level of atopy to see how he does over the next 6 months and then consider step down to the 100 - ok to leave off singulair as made no difference   I had an extended discussion with the patient reviewing all relevant studies completed to date and  lasting 15 to 20 minutes of a 25 minute visit    Each maintenance medication was reviewed in detail including most importantly the difference between maintenance and prns and under what circumstances the prns are to be triggered using an action plan format that is not reflected in the computer generated alphabetically organized AVS.    Please see instructions for details which were reviewed in writing and the patient given a copy highlighting the part that I personally wrote and discussed at today's ov.

## 2016-01-26 ENCOUNTER — Other Ambulatory Visit: Payer: Self-pay | Admitting: Medical

## 2016-01-27 NOTE — Telephone Encounter (Signed)
Is this okay to refill? 

## 2016-02-27 ENCOUNTER — Other Ambulatory Visit: Payer: Self-pay | Admitting: Medical

## 2016-02-27 ENCOUNTER — Other Ambulatory Visit: Payer: Self-pay

## 2016-02-27 ENCOUNTER — Telehealth: Payer: Self-pay

## 2016-02-27 DIAGNOSIS — R0600 Dyspnea, unspecified: Secondary | ICD-10-CM

## 2016-02-27 MED ORDER — VITAMIN D (ERGOCALCIFEROL) 1.25 MG (50000 UNIT) PO CAPS: 1.0000 [IU] | ORAL_CAPSULE | ORAL | 1 refills | Status: DC

## 2016-02-27 NOTE — Telephone Encounter (Signed)
Sent refill to pharmacy. 

## 2016-02-27 NOTE — Telephone Encounter (Signed)
pls refill 

## 2016-02-27 NOTE — Telephone Encounter (Signed)
[facility] [address]

## 2016-02-28 ENCOUNTER — Other Ambulatory Visit: Payer: Self-pay | Admitting: Medical

## 2016-03-02 ENCOUNTER — Other Ambulatory Visit: Payer: Self-pay | Admitting: Family Medicine

## 2016-03-02 MED ORDER — VITAMIN D (ERGOCALCIFEROL) 1.25 MG (50000 UNIT) PO CAPS: 1.0000 [IU] | ORAL_CAPSULE | ORAL | 2 refills | Status: DC

## 2016-04-10 ENCOUNTER — Other Ambulatory Visit: Payer: Self-pay | Admitting: Medical

## 2016-05-12 ENCOUNTER — Other Ambulatory Visit: Payer: Self-pay | Admitting: Medical

## 2016-06-17 ENCOUNTER — Encounter: Payer: Self-pay | Admitting: Internal Medicine

## 2016-07-22 ENCOUNTER — Ambulatory Visit
Admission: RE | Admit: 2016-07-22 | Discharge: 2016-07-22 | Disposition: A | Discharge status: Home / Self Care | Payer: Managed Care, Other (non HMO) | Source: Ambulatory Visit | Attending: Medical | Admitting: Medical

## 2016-07-22 ENCOUNTER — Ambulatory Visit (INDEPENDENT_AMBULATORY_CARE_PROVIDER_SITE_OTHER): Payer: Managed Care, Other (non HMO) | Admitting: Medical

## 2016-07-22 ENCOUNTER — Other Ambulatory Visit: Payer: Self-pay | Admitting: Medical

## 2016-07-22 ENCOUNTER — Encounter: Payer: Self-pay | Admitting: Medical

## 2016-07-22 VITALS — BP 132/90 | HR 56 | Ht 75.0 in | Wt 294.2 lb

## 2016-07-22 DIAGNOSIS — G8929 Other chronic pain: Secondary | ICD-10-CM | POA: Insufficient documentation

## 2016-07-22 DIAGNOSIS — Z125 Encounter for screening for malignant neoplasm of prostate: Secondary | ICD-10-CM

## 2016-07-22 DIAGNOSIS — E785 Hyperlipidemia, unspecified: Secondary | ICD-10-CM

## 2016-07-22 DIAGNOSIS — Z0000 Encounter for general adult medical examination without abnormal findings: Secondary | ICD-10-CM | POA: Diagnosis not present

## 2016-07-22 DIAGNOSIS — M25561 Pain in right knee: Secondary | ICD-10-CM | POA: Diagnosis not present

## 2016-07-22 DIAGNOSIS — G4733 Obstructive sleep apnea (adult) (pediatric): Secondary | ICD-10-CM

## 2016-07-22 DIAGNOSIS — G44229 Chronic tension-type headache, not intractable: Secondary | ICD-10-CM

## 2016-07-22 DIAGNOSIS — E669 Obesity, unspecified: Secondary | ICD-10-CM

## 2016-07-22 DIAGNOSIS — J45991 Cough variant asthma: Secondary | ICD-10-CM | POA: Diagnosis not present

## 2016-07-22 DIAGNOSIS — J309 Allergic rhinitis, unspecified: Secondary | ICD-10-CM | POA: Diagnosis not present

## 2016-07-22 DIAGNOSIS — R040 Epistaxis: Secondary | ICD-10-CM

## 2016-07-22 DIAGNOSIS — Z1211 Encounter for screening for malignant neoplasm of colon: Secondary | ICD-10-CM | POA: Insufficient documentation

## 2016-07-22 DIAGNOSIS — L209 Atopic dermatitis, unspecified: Secondary | ICD-10-CM | POA: Diagnosis not present

## 2016-07-22 LAB — POCT URINALYSIS DIPSTICK
Bilirubin, UA: NEGATIVE
Blood, UA: NEGATIVE
Glucose, UA: NEGATIVE
Ketones, UA: NEGATIVE
Leukocytes, UA: NEGATIVE
Nitrite, UA: NEGATIVE
Protein, UA: NEGATIVE
Spec Grav, UA: 1.015 (ref 1.030–1.035)
Urobilinogen, UA: NEGATIVE (ref ?–2.0)
pH, UA: 7 (ref 5.0–8.0)

## 2016-07-22 LAB — COMPREHENSIVE METABOLIC PANEL
ALT: 27 U/L (ref 9–46)
AST: 39 U/L (ref 10–40)
Albumin: 4.1 g/dL (ref 3.6–5.1)
Alkaline Phosphatase: 44 U/L (ref 40–115)
BUN: 11 mg/dL (ref 7–25)
CO2: 28 mmol/L (ref 20–31)
Calcium: 9.6 mg/dL (ref 8.6–10.3)
Chloride: 103 mmol/L (ref 98–110)
Creat: 0.94 mg/dL (ref 0.60–1.35)
Glucose, Bld: 97 mg/dL (ref 65–99)
Potassium: 4.1 mmol/L (ref 3.5–5.3)
Sodium: 138 mmol/L (ref 135–146)
Total Bilirubin: 1.1 mg/dL (ref 0.2–1.2)
Total Protein: 7.1 g/dL (ref 6.1–8.1)

## 2016-07-22 LAB — CBC
HCT: 40.9 % (ref 38.5–50.0)
Hemoglobin: 13.6 g/dL (ref 13.2–17.1)
MCH: 24.6 pg — ABNORMAL LOW (ref 27.0–33.0)
MCHC: 33.3 g/dL (ref 32.0–36.0)
MCV: 74 fL — ABNORMAL LOW (ref 80.0–100.0)
MPV: 10.4 fL (ref 7.5–12.5)
Platelets: 202 10*3/uL (ref 140–400)
RBC: 5.53 MIL/uL (ref 4.20–5.80)
RDW: 17.1 % — ABNORMAL HIGH (ref 11.0–15.0)
WBC: 5.1 10*3/uL (ref 4.0–10.5)

## 2016-07-22 LAB — LIPID PANEL
Cholesterol: 170 mg/dL (ref <200)
HDL: 46 mg/dL (ref >40)
LDL Cholesterol: 105 mg/dL — ABNORMAL HIGH (ref <100)
Total CHOL/HDL Ratio: 3.7 Ratio (ref <5.0)
Triglycerides: 97 mg/dL (ref <150)
VLDL: 19 mg/dL (ref <30)

## 2016-07-22 LAB — PSA: PSA: 0.8 ng/mL (ref <=4.0)

## 2016-07-22 NOTE — Progress Notes (Signed)
Subjective:   HPI  Devin Erickson is a 46 y.o. male who presents for physical Chief Complaint  Patient presents with  . Annual Exam    physical and rt knee pain   Medical care team includes: Ernst Breach, PA-C here for primary care Dentist Eye doctor  Concerns: Having some knee pain - thinks he pulled ligament in right knee.   Thinks he may have injured this exercising maybe a month ago.   Was running/jogging and felt pain 2 days later.  No pain when standing. Worse if sitting for long periods.     Started a new job, and this job is a little more physical.    Sometimes his fingertips go numb.  Can be intermittent, can vary from one finger to the next at random times.   Can sometimes gets joint swelling in PIP of fingers, usually with the fingertip that is numb.   Can be bilat, but mostly left hand.   No neck pain.  No problems with neck ROM  OSA - Uses CPAP nightly  Hx/o depression - sometimes down in mood.   He notes years ago there was mention of bipolar.  Denies major mood swings, but in the past was way worse with mood and depression.   Years ago was on lots of medication for this.   He notes that he doesn't have this to be a major problem now.  Hasn't been on medication in about 1.5 years, last through psychiatry.   No current counseling.   Saw Dr. Kellie Simmering this past year for hair loss to rule out rheumatologic condition.  Has f/u  With him this month.    He is fasting.  Reviewed their medical, surgical, family, social, medication, and allergy history and updated chart as appropriate.  Past Medical History:  Diagnosis Date  . Anxiety   . Bipolar disorder (HCC)   . Depression   . Eczema   . H/O exercise stress test 2009   [facility], normal per pt  . Hair loss    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  . History of sleep apnea 04/2012   per overnight screen/oximetry   . Obesity   . Thalassemia minor   . Wears glasses   . Wears glasses     Past Surgical History:   Procedure Laterality Date  . COLONOSCOPY  2012   [facility], normal per pt  . TONSILLECTOMY AND ADENOIDECTOMY     age 48yo    Social History   Social History  . Marital status: Married    Spouse name: N/A  . Number of children: N/A  . Years of education: N/A   Occupational History  . Not on file.   Social History Main Topics  . Smoking status: Never Smoker  . Smokeless tobacco: Never Used  . Alcohol use No     Comment: few beers a week.maybe 3-4  . Drug use: No  . Sexual activity: Not on file   Other Topics Concern  . 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  [place] Witness.  06/2016    Family History  Problem Relation Age of Onset  . Cancer Mother 27    colon  . Hypertension Mother   . Dementia Father   . Cancer Father     pancreas  . Heart disease Neg Hx   . Lung disease Neg Hx      Current Outpatient  Prescriptions:  .  atorvastatin (LIPITOR) 20 MG tablet, TAKE 1 TABLET BY MOUTH EVERY DAY, Disp: 90 tablet, Rfl: 0 .  [facility] ALLERGY RELIEF 180 MG tablet, TAKE 1 TABLET BY MOUTH EVERY DAY, Disp: 90 tablet, Rfl: 0 .  mometasone-formoterol (DULERA) 200-5 MCG/ACT AERO, Take 2 puffs first thing in am and then another 2 puffs about 12 hours later., Disp: 1 Inhaler, Rfl: 11 .  Multiple Vitamin (MULTIVITAMIN) tablet, Take 1 tablet by mouth daily., Disp: , Rfl:  .  PROAIR HFA 108 (90 Base) MCG/ACT inhaler, INHALE 2 PUFFS INTO THE LUNGS EVERY 6 (SIX) HOURS AS NEEDED FOR WHEEZING OR SHORTNESS OF BREATH., Disp: 8.5 Inhaler, Rfl: 1  No Known Allergies     Review of Systems Constitutional: -fever, -chills, -sweats, -unexpected weight change, -decreased appetite, -fatigue Allergy: -sneezing, -itching, -congestion Dermatology: -changing moles, --rash, -lumps ENT: -runny nose, -ear pain, -sore throat, -hoarseness, -sinus pain, -teeth pain, - ringing in ears, -hearing loss, -nosebleeds Cardiology: -chest  pain, -palpitations, -swelling, -difficulty breathing when lying flat, -waking up short of breath Respiratory: -cough, -shortness of breath, -difficulty breathing with exercise or exertion, -wheezing, -coughing up blood Gastroenterology: -abdominal pain, -nausea, -vomiting, -diarrhea, -constipation, -blood in stool, -changes in bowel movement, -difficulty swallowing or eating Hematology: -bleeding, -bruising  Musculoskeletal: +joint aches, -muscle aches, -joint swelling, -back pain, -neck pain, -cramping, -changes in gait Ophthalmology: denies vision changes, eye redness, itching, discharge Urology: -burning with urination, -difficulty urinating, -blood in urine, -urinary frequency, -urgency, -incontinence Neurology: -headache, -weakness, +tingling, +numbness, -memory loss, -falls, -dizziness Psychology: +sometimes depressed mood, -agitation, -sleep problems     Objective:  BP 132/90   Pulse (!) 56   Ht 6\' 3"  (1.905 m)   Wt 294 lb 3.2 oz (133.4 kg)   SpO2 98%   BMI 36.77 kg/m   BP Readings from Last 3 Encounters:  07/22/16 132/90  01/23/16 114/82  12/12/15 120/78   Wt Readings from Last 3 Encounters:  07/22/16 294 lb 3.2 oz (133.4 kg)  01/23/16 (!) 311 lb 3.2 oz (141.2 kg)  12/12/15 (!) 317 lb (143.8 kg)   General appearance: alert, no distress, WD/WN, African American male Skin: remains hairless throughout. HEENT: normocephalic, conjunctiva/corneas normal, sclerae anicteric, PERRLA, EOMi, nares patent, no discharge or erythema, pharynx normal Oral cavity: MMM, tongue normal, teeth normal Neck: supple, no lymphadenopathy, no thyromegaly, no masses, normal ROM, no bruits Chest: non tender, normal shape and expansion Heart: RRR, normal S1, S2, no murmurs Lungs: CTA bilaterally, no wheezes, rhonchi, or rales Abdomen: +bs, soft, non tender, non distended, no masses, no hepatomegaly, no splenomegaly, no bruits Back: non tender, normal ROM, no scoliosis Musculoskeletal: mild  tenderness right anterior knee and patellar tendon, otherwise nontender, upper extremities non tender, no obvious deformity, normal ROM throughout, lower extremities non tender, no obvious deformity, normal ROM throughout Extremities: no edema, no cyanosis, no clubbing Pulses: 2+ symmetric, upper and lower extremities, normal cap refill Neurological: alert, oriented x 3, CN2-12 intact, strength normal upper extremities and lower extremities, sensation normal throughout, DTRs 2+ throughout, no cerebellar signs, gait normal Psychiatric: normal affect, behavior normal, pleasant  GU: normal male external genitalia, non tender, no masses, no hernia, no lymphadenopathy Rectal: deferred   Assessment and Plan :    Encounter Diagnoses  Name Primary?  . Encounter for health maintenance examination in adult   . Cough variant asthma vs UACS Yes  . Epistaxis   . OSA (obstructive sleep apnea)   . Allergic rhinitis, unspecified chronicity, unspecified seasonality, unspecified  trigger   . Atopic dermatitis, unspecified type   . Chronic tension-type headache, not intractable   . Hyperlipidemia, unspecified hyperlipidemia type   . Obesity without serious comorbidity, unspecified classification, unspecified obesity type   . Chronic pain of right knee   . Screening for prostate cancer     Physical exam - discussed and counseled on healthy lifestyle, diet, exercise, preventative care, vaccinations, sick and well care, proper use of emergency dept and after hours care, and addressed their concerns.    Health screening: See your eye doctor yearly for routine vision care. See your dentist yearly for routine dental care including hygiene visits twice yearly.  Cancer screening Discussed colonoscopy screening age 63yo unless higher risk for earlier screening Discussed PSA, prostate exam, and prostate cancer screening risks/benefits.   Discussed prostate symptoms as well.  Prostate screening performed:  Yes  Vaccinations: Counseled on the following vaccines:  influenza  Acute issues discussed: Knee pain - send for xray Finger pain, paresthesia - likely OA, offered xray, he declines, use NSAID prn  Separate significant chronic issues discussed: OSA - c/t CPAP Other issues listed in problems list stable at current depression - he declines medication, overall seems to be doing well, no major concern today for uncontrolled bipolar disorder, questionable prior diagnosis obesity - needs to lose weight  Caymen was seen today for annual exam.  Diagnoses and all orders for this visit:  Cough variant asthma vs UACS  Encounter for health maintenance examination in adult -     Urinalysis Dipstick -     Comprehensive metabolic panel -     Lipid panel -     CBC -     PSA -     Hemoglobin A1c  Epistaxis  OSA (obstructive sleep apnea)  Allergic rhinitis, unspecified chronicity, unspecified seasonality, unspecified trigger  Atopic dermatitis, unspecified type  Chronic tension-type headache, not intractable  Hyperlipidemia, unspecified hyperlipidemia type  Obesity without serious comorbidity, unspecified classification, unspecified obesity type  Chronic pain of right knee -     DG Knee Complete 4 Views Right; Future  Screening for prostate cancer -     PSA    Follow-up pending labs, yearly for physical

## 2016-07-23 LAB — HEMOGLOBIN A1C
Hgb A1c MFr Bld: 5.1 % (ref <5.7)
Mean Plasma Glucose: 100 mg/dL

## 2016-07-24 ENCOUNTER — Ambulatory Visit: Payer: 59 | Admitting: Internal Medicine

## 2016-10-28 ENCOUNTER — Other Ambulatory Visit: Payer: Self-pay | Admitting: Medical

## 2016-10-29 NOTE — Telephone Encounter (Signed)
Can pt have a refill on this 

## 2016-11-04 ENCOUNTER — Telehealth: Payer: Self-pay

## 2016-11-04 NOTE — Telephone Encounter (Signed)
[facility] called [facility] Please call pt [phone] when sent. Trixie Rude/RLB

## 2016-11-04 NOTE — Telephone Encounter (Signed)
I don't think he has been using this. What is his efforts with weight loss currently? When was the last time he used it?

## 2016-11-05 NOTE — Telephone Encounter (Signed)
Tried to call pt his phone would ring one time he has no voicemail or anything.

## 2016-11-06 NOTE — Telephone Encounter (Signed)
Attempted call to pt- no VCM.

## 2016-11-13 ENCOUNTER — Other Ambulatory Visit: Payer: Self-pay | Admitting: Medical

## 2016-11-16 ENCOUNTER — Telehealth: Payer: Self-pay

## 2016-11-16 NOTE — Telephone Encounter (Signed)
Pt 's wife called and said that he needs a refill on his contrave that he is using it to help him to loose weight. She wants to know if he can have a refill on contrave.

## 2016-11-17 ENCOUNTER — Other Ambulatory Visit: Payer: Self-pay | Admitting: Medical

## 2016-11-17 MED ORDER — NALTREXONE-BUPROPION HCL ER 8-90 MG PO TB12: 2.0000 | ORAL_TABLET | Freq: Two times a day (BID) | ORAL | 1 refills | Status: DC

## 2016-11-17 NOTE — Telephone Encounter (Signed)
Called and notified his wife of this , she is going to have call us back to make an appt.

## 2016-11-17 NOTE — Telephone Encounter (Signed)
Med sent, recheck 6 wk to check progress

## 2016-12-21 ENCOUNTER — Encounter (HOSPITAL_COMMUNITY): Payer: Self-pay | Admitting: *Deleted

## 2016-12-21 ENCOUNTER — Ambulatory Visit (HOSPITAL_COMMUNITY)
Admission: EM | Admit: 2016-12-21 | Discharge: 2016-12-21 | Disposition: A | Discharge status: Home / Self Care | Payer: Managed Care, Other (non HMO) | Attending: Family Medicine | Admitting: Family Medicine

## 2016-12-21 DIAGNOSIS — J029 Acute pharyngitis, unspecified: Secondary | ICD-10-CM

## 2016-12-21 DIAGNOSIS — H6980 Other specified disorders of Eustachian tube, unspecified ear: Secondary | ICD-10-CM

## 2016-12-21 MED ORDER — IPRATROPIUM BROMIDE 0.06 % NA SOLN: 2.0000 | Freq: Four times a day (QID) | NASAL | 0 refills | Status: DC

## 2016-12-21 MED ORDER — AZITHROMYCIN 250 MG PO TABS: 250.0000 mg | ORAL_TABLET | Freq: Every day | ORAL | 0 refills | Status: DC

## 2016-12-21 NOTE — ED Triage Notes (Addendum)
Patient reports sore throat and left sided ear pain. No fevers at home.

## 2016-12-21 NOTE — ED Provider Notes (Signed)
[facility]   [id number] 12/21/16 Arrival Time: 1805  ASSESSMENT & PLAN:  1. Acute pharyngitis, unspecified etiology   2. Dysfunction of Eustachian tube, unspecified laterality     Meds ordered this encounter  Medications  . azithromycin (ZITHROMAX) 250 MG tablet    Sig: Take 1 tablet (250 mg total) by mouth daily. Take first 2 tablets together, then 1 every day until finished.    Dispense:  6 tablet    Refill:  0    Order Specific Question:   Supervising Provider    Answer:   Eustace Moore [[id number]]  . ipratropium (ATROVENT) 0.06 % nasal spray    Sig: Place 2 sprays into both nostrils 4 (four) times daily.    Dispense:  15 mL    Refill:  0    Order Specific Question:   Supervising Provider    Answer:   Eustace Moore [[id number]]   Push po fluids, rest, tylenol and motrin otc prn as directed for fever, arthralgias, and myalgias.  Follow up prn if sx's continue or persist. Reviewed expectations re: course of current medical issues. Questions answered. Outlined signs and symptoms indicating need for more acute intervention. Patient verbalized understanding. After Visit Summary given.   SUBJECTIVE:  Devin Erickson is a 46 y.o. male who presents with complaint of sore throat and uri sx's.  ROS: As per HPI.   OBJECTIVE:  Vitals:   12/21/16 1852  BP: 131/79  Pulse: 79  Resp: 16  Temp: 98.6 F (37 C)  TempSrc: Oral  SpO2: 100%     General appearance: alert; no distress Eyes: PERRLA; EOMI; conjunctiva normal HENT: normocephalic; atraumatic; TMs normal; nasal mucosa normal; oral mucosa normal Neck: supple Lungs: clear to auscultation bilaterally Heart: regular rate and rhythm Abdomen: soft, non-tender; bowel sounds normal; no masses or organomegaly; no guarding or rebound tenderness Back: no CVA tenderness Extremities: no cyanosis or edema; symmetrical with no gross deformities Skin: warm and dry Neurologic: normal gait; normal symmetric  reflexes Psychological: alert and cooperative; normal mood and affect  Past Medical History:  Diagnosis Date  . Anxiety   . Bipolar disorder (HCC)   . Depression   . Eczema   . H/O exercise stress test 2009   [facility], normal per pt  . Hair loss    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  . History of sleep apnea 04/2012   per overnight screen/oximetry   . Obesity   . Thalassemia minor   . Wears glasses   . Wears glasses      has a past medical history of Anxiety; Bipolar disorder (HCC); Depression; Eczema; H/O exercise stress test (2009); Hair loss; History of sleep apnea (04/2012); Obesity; Thalassemia minor; Wears glasses; and Wears glasses.  Results for orders placed or performed in visit on 07/22/16  Comprehensive metabolic panel  Result Value Ref Range   Sodium 138 135 - 146 mmol/L   Potassium 4.1 3.5 - 5.3 mmol/L   Chloride 103 98 - 110 mmol/L   CO2 28 20 - 31 mmol/L   Glucose, Bld 97 65 - 99 mg/dL   BUN 11 7 - 25 mg/dL   Creat 5.83 0.94 - 0.76 mg/dL   Total Bilirubin 1.1 0.2 - 1.2 mg/dL   Alkaline Phosphatase 44 40 - 115 U/L   AST 39 10 - 40 U/L   ALT 27 9 - 46 U/L   Total Protein 7.1 6.1 - 8.1 g/dL   Albumin 4.1 3.6 - 5.1 g/dL  Calcium 9.6 8.6 - 10.3 mg/dL  Lipid panel  Result Value Ref Range   Cholesterol 170 <200 mg/dL   Triglycerides 97 <981 mg/dL   HDL 46 >19 mg/dL   Total CHOL/HDL Ratio 3.7 <5.0 Ratio   VLDL 19 <30 mg/dL   LDL Cholesterol 147 (H) <100 mg/dL  CBC  Result Value Ref Range   WBC 5.1 4.0 - 10.5 K/uL   RBC 5.53 4.20 - 5.80 MIL/uL   Hemoglobin 13.6 13.2 - 17.1 g/dL   HCT 82.9 56.2 - 13.0 %   MCV 74.0 (L) 80.0 - 100.0 fL   MCH 24.6 (L) 27.0 - 33.0 pg   MCHC 33.3 32.0 - 36.0 g/dL   RDW 86.5 (H) 78.4 - 69.6 %   Platelets 202 140 - 400 K/uL   MPV 10.4 7.5 - 12.5 fL  PSA  Result Value Ref Range   PSA 0.8 <=4.0 ng/mL  Hemoglobin A1c  Result Value Ref Range   Hgb A1c MFr Bld 5.1 <5.7 %   Mean Plasma Glucose 100 mg/dL  Urinalysis Dipstick   Result Value Ref Range   Color, UA yellow    Clarity, UA clear    Glucose, UA neg    Bilirubin, UA neg    Ketones, UA neg    Spec Grav, UA 1.015 1.030 - 1.035   Blood, UA neg    pH, UA 7.0 5.0 - 8.0   Protein, UA neg    Urobilinogen, UA negative Negative - 2.0   Nitrite, UA neg    Leukocytes, UA Negative Negative    Labs Reviewed - No data to display  Imaging: No results found.  No Known Allergies  Family History  Problem Relation Age of Onset  . Cancer Mother 2       colon  . Hypertension Mother   . Dementia Father   . Cancer Father        pancreas  . Heart disease Neg Hx   . Lung disease Neg Hx    Past Surgical History:  Procedure Laterality Date  . COLONOSCOPY  2012   [facility], normal per pt  . TONSILLECTOMY AND ADENOIDECTOMY     age [place], [place] 12/21/16 1909

## 2016-12-22 ENCOUNTER — Ambulatory Visit: Payer: Managed Care, Other (non HMO) | Admitting: Family Medicine

## 2017-01-20 ENCOUNTER — Other Ambulatory Visit: Payer: Self-pay | Admitting: Medical

## 2017-01-20 NOTE — Telephone Encounter (Signed)
Called and l/m for pt call us back to make an appt , I will send in a 30 day supply ,but pt needs to med check appt.

## 2017-01-25 ENCOUNTER — Telehealth: Payer: Self-pay | Admitting: Medical

## 2017-01-25 ENCOUNTER — Other Ambulatory Visit: Payer: Self-pay

## 2017-01-25 MED ORDER — ATORVASTATIN CALCIUM 20 MG PO TABS: 20.0000 mg | ORAL_TABLET | Freq: Every day | ORAL | 0 refills | Status: DC

## 2017-01-25 NOTE — Telephone Encounter (Signed)
Sent in refill to pharmacy.

## 2017-01-25 NOTE — Telephone Encounter (Signed)
Pt called and made a medcheck appt. Please refill medication.

## 2017-02-08 ENCOUNTER — Encounter: Payer: Self-pay | Admitting: Medical

## 2017-02-08 ENCOUNTER — Ambulatory Visit (INDEPENDENT_AMBULATORY_CARE_PROVIDER_SITE_OTHER): Payer: Managed Care, Other (non HMO) | Admitting: Medical

## 2017-02-08 VITALS — BP 132/78 | HR 75 | Wt 282.3 lb

## 2017-02-08 DIAGNOSIS — Z23 Encounter for immunization: Secondary | ICD-10-CM | POA: Diagnosis not present

## 2017-02-08 DIAGNOSIS — M25612 Stiffness of left shoulder, not elsewhere classified: Secondary | ICD-10-CM | POA: Diagnosis not present

## 2017-02-08 DIAGNOSIS — M436 Torticollis: Secondary | ICD-10-CM | POA: Diagnosis not present

## 2017-02-08 DIAGNOSIS — M25512 Pain in left shoulder: Secondary | ICD-10-CM

## 2017-02-08 DIAGNOSIS — R0600 Dyspnea, unspecified: Secondary | ICD-10-CM

## 2017-02-08 DIAGNOSIS — M25561 Pain in right knee: Secondary | ICD-10-CM

## 2017-02-08 DIAGNOSIS — J45991 Cough variant asthma: Secondary | ICD-10-CM

## 2017-02-08 DIAGNOSIS — M2142 Flat foot [pes planus] (acquired), left foot: Secondary | ICD-10-CM

## 2017-02-08 DIAGNOSIS — G8929 Other chronic pain: Secondary | ICD-10-CM | POA: Diagnosis not present

## 2017-02-08 DIAGNOSIS — M542 Cervicalgia: Secondary | ICD-10-CM | POA: Diagnosis not present

## 2017-02-08 DIAGNOSIS — M2141 Flat foot [pes planus] (acquired), right foot: Secondary | ICD-10-CM

## 2017-02-08 MED ORDER — MOMETASONE FURO-FORMOTEROL FUM 200-5 MCG/ACT IN AERO: INHALATION_SPRAY | RESPIRATORY_TRACT | 5 refills | Status: DC

## 2017-02-08 MED ORDER — NAPROXEN 500 MG PO TABS: 500.0000 mg | ORAL_TABLET | Freq: Two times a day (BID) | ORAL | 0 refills | Status: DC

## 2017-02-08 MED ORDER — CYCLOBENZAPRINE HCL 10 MG PO TABS: ORAL_TABLET | ORAL | 0 refills | Status: DC

## 2017-02-08 MED ORDER — ALBUTEROL SULFATE HFA 108 (90 BASE) MCG/ACT IN AERS: 2.0000 | INHALATION_SPRAY | Freq: Four times a day (QID) | RESPIRATORY_TRACT | 1 refills | Status: DC | PRN

## 2017-02-08 NOTE — Progress Notes (Signed)
Subjective: Chief Complaint  Patient presents with  . med check    med check , arm  pain    He notes problems moving left shoulder, pain in shoulder, pain in left neck as well.   Is stiff in neck.   Has had problems with this on and off.  Had problems with this last week as well.   Saw chiropractor 2.5 weeks ago for routine adjustment, not specifically for this issues.   Denies specific injury or trauma or fall.   He notes numbness in left fingertips last week.  Last night only got 1.5 hour of sleep given the pain and stiffness.  Taking OTC medication for symptoms.    Asthma - Taking Dulera daily, albuterol prn, no problems lately breathing.    Hasn't had to use albuterol in recent months.    Having some recent arthritis problems in right knee.   lately pains in knee.   No injury, no trauma.  Has stiffness and pain in right knee.  Can't run on right knee.   Sometimes feels lax.  No swelling.     Past Medical History:  Diagnosis Date  . Anxiety   . Bipolar disorder (HCC)   . Depression   . Eczema   . H/O exercise stress test 2009   [facility], normal per pt  . Hair loss    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  . History of sleep apnea 04/2012   per overnight screen/oximetry   . Obesity   . Thalassemia minor   . Wears glasses   . Wears glasses    Current Outpatient Prescriptions on File Prior to Visit  Medication Sig Dispense Refill  . atorvastatin (LIPITOR) 20 MG tablet Take 1 tablet (20 mg total) by mouth daily. 30 tablet 0  . Multiple Vitamin (MULTIVITAMIN) tablet Take 1 tablet by mouth daily.     No current facility-administered medications on file prior to visit.    ROS as in subjective   Objective: BP 132/78   Pulse 75   Wt 282 lb 4.8 oz (128.1 kg)   SpO2 98%   BMI 35.29 kg/m   Gen: wd, wn, nad Lungs clear Heart rrr, normal s1, s2, no murmurs Neck: tender left lateral neck, ROM decreased to 60% normal with flexion/extension, decreased to 80% with rotation, no mass, no  lymphadenopathy Tender left upper back paraspinal Shoulder non tender, but pain with shoulder ROM which is quite limited.  He can only flex or abduct left shoulder about 20 degrees.   otherwise arm nontender, no deformity Arms neurovascularly intact     Assessment: Encounter Diagnoses  Name Primary?  . Neck pain Yes  . Need for influenza vaccination   . Neck stiffness   . Acute pain of left shoulder   . Decreased ROM of left shoulder   . Dyspnea, unspecified type   . Cough variant asthma vs UACS   . Chronic pain of right knee   . Flat feet     Plan: Neck and shoulder pain - discussed possible differential.  Will send for xrays.  medications as below prn (flexeril, naprosyn).   discussed proper use of medications, risks/benefits of medication.   Knee pain - exam unremarkable.  Reviewed prior xray from 2016.   Advised he try shoes with arch support to help with over pronation as a next step.  Can use OTC analgesic prn.  Asthma - doing fine on current regimen.  Rarely having to use albuterol  C/t Dulera BID,  discussed proper use of medications.     Counseled on the influenza virus vaccine.  Vaccine information sheet given.  Influenza vaccine given after consent obtained.  [place] was seen today for med check.  Diagnoses and all orders for this visit:  Neck pain -     DG Cervical Spine Complete; Future -     DG Shoulder Left; Future  Need for influenza vaccination -     Flu Vaccine QUAD 36+ mos IM  Neck stiffness -     DG Cervical Spine Complete; Future -     DG Shoulder Left; Future  Acute pain of left shoulder -     DG Cervical Spine Complete; Future -     DG Shoulder Left; Future  Decreased ROM of left shoulder -     DG Cervical Spine Complete; Future -     DG Shoulder Left; Future  Dyspnea, unspecified type -     albuterol (PROAIR HFA) 108 (90 Base) MCG/ACT inhaler; Inhale 2 puffs into the lungs every 6 (six) hours as needed for wheezing or shortness of  breath.  Cough variant asthma vs UACS  Chronic pain of right knee  Flat feet  Other orders -     cyclobenzaprine (FLEXERIL) 10 MG tablet; 1 tablet po QHS prn or up to BID prn -     naproxen (NAPROSYN) 500 MG tablet; Take 1 tablet (500 mg total) by mouth 2 (two) times daily with a meal. -     mometasone-formoterol (DULERA) 200-5 MCG/ACT AERO; Take 2 puffs first thing in am and then another 2 puffs about 12 hours later.

## 2017-02-09 ENCOUNTER — Telehealth: Payer: Self-pay | Admitting: Medical

## 2017-02-09 NOTE — Telephone Encounter (Signed)
Recv'd fax from [facility] stating Dulera not covered and request change to alternative Symbicort is first preferred then Advair is second.  Called pt and he hasn't tried either and is ok switching to either.

## 2017-02-09 NOTE — Telephone Encounter (Signed)
Pt dropped off forms for work for him to be on light duty, but he also stated that if he can not lift over 15lbs that he just needs to be written out for for work until Friday pt can be reached at [phone] please get form from [employer] please fill out asap so he can get back to work

## 2017-02-10 ENCOUNTER — Ambulatory Visit
Admission: RE | Admit: 2017-02-10 | Discharge: 2017-02-10 | Disposition: A | Discharge status: Home / Self Care | Payer: Managed Care, Other (non HMO) | Source: Ambulatory Visit | Attending: Medical | Admitting: Medical

## 2017-02-10 DIAGNOSIS — M25612 Stiffness of left shoulder, not elsewhere classified: Secondary | ICD-10-CM

## 2017-02-10 DIAGNOSIS — M25512 Pain in left shoulder: Secondary | ICD-10-CM

## 2017-02-10 DIAGNOSIS — M542 Cervicalgia: Secondary | ICD-10-CM

## 2017-02-10 DIAGNOSIS — M436 Torticollis: Secondary | ICD-10-CM

## 2017-02-10 NOTE — Telephone Encounter (Signed)
Form ready to send back 

## 2017-02-10 NOTE — Telephone Encounter (Signed)
Ok to switch to Symbicort per Dr. Susann GivensLalonde, called [facility] and switched

## 2017-02-11 MED ORDER — BUDESONIDE-FORMOTEROL FUMARATE 160-4.5 MCG/ACT IN AERO: 2.0000 | INHALATION_SPRAY | Freq: Two times a day (BID) | RESPIRATORY_TRACT | 5 refills | Status: DC

## 2017-02-11 NOTE — Telephone Encounter (Signed)
Called pt he will p/u froms

## 2017-02-26 ENCOUNTER — Other Ambulatory Visit: Payer: Self-pay | Admitting: Medical

## 2017-04-06 ENCOUNTER — Other Ambulatory Visit: Payer: Self-pay | Admitting: Medical

## 2017-05-14 ENCOUNTER — Other Ambulatory Visit: Payer: Self-pay

## 2017-05-14 ENCOUNTER — Telehealth: Payer: Self-pay | Admitting: Medical

## 2017-05-14 MED ORDER — ATORVASTATIN CALCIUM 20 MG PO TABS: 20.0000 mg | ORAL_TABLET | Freq: Every day | ORAL | 0 refills | Status: DC

## 2017-05-14 MED ORDER — ATORVASTATIN CALCIUM 20 MG PO TABS: 20.0000 mg | ORAL_TABLET | Freq: Every day | ORAL | 1 refills | Status: DC

## 2017-05-14 NOTE — Telephone Encounter (Signed)
Sent  [facility] for [facility]

## 2017-05-14 NOTE — Telephone Encounter (Signed)
  Fax refill request   Atorvastatin 20mg   Insurance requires 90 day supply

## 2017-06-24 ENCOUNTER — Ambulatory Visit (INDEPENDENT_AMBULATORY_CARE_PROVIDER_SITE_OTHER): Payer: Managed Care, Other (non HMO) | Admitting: Medical

## 2017-06-24 VITALS — Wt 287.0 lb

## 2017-06-24 DIAGNOSIS — G4733 Obstructive sleep apnea (adult) (pediatric): Secondary | ICD-10-CM | POA: Diagnosis not present

## 2017-06-24 DIAGNOSIS — R202 Paresthesia of skin: Secondary | ICD-10-CM

## 2017-06-24 DIAGNOSIS — H8113 Benign paroxysmal vertigo, bilateral: Secondary | ICD-10-CM | POA: Diagnosis not present

## 2017-06-24 MED ORDER — MECLIZINE HCL 25 MG PO TABS: 25.0000 mg | ORAL_TABLET | Freq: Two times a day (BID) | ORAL | 0 refills | Status: DC

## 2017-06-24 NOTE — Progress Notes (Signed)
Subjective:  Devin Erickson is a 47 y.o. male who presents for possible a few different concerns.  He reports 4 day hx/o dizziness.  Awoke Sunday with room feeling sensation, worse when he got up or moved head fast.   This lasts for seconds.  Symptoms have been intermittent.  No hearing change, no blurred vision, no chest pain, no edema, no SOB.  He notes for a few months that both hands feel numb, right leg from knee down feels numb and left toes feel numb.  No injury, no trauma.  No change in diet or activity.  No headaches, no confusion.   He was using CPAP but stopped this a few months ago after he had lost some weight.    No other aggravating or relieving factors.  No other c/o.  Past Medical History:  Diagnosis Date  . Anxiety   . Bipolar disorder (HCC)   . Depression   . Eczema   . H/O exercise stress test 2009   [facility], normal per pt  . Hair loss    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  . History of sleep apnea 04/2012   per overnight screen/oximetry   . Obesity   . Thalassemia minor   . Wears glasses   . Wears glasses    Current Outpatient Medications on File Prior to Visit  Medication Sig Dispense Refill  . albuterol (PROAIR HFA) 108 (90 Base) MCG/ACT inhaler Inhale 2 puffs into the lungs every 6 (six) hours as needed for wheezing or shortness of breath. 8.5 Inhaler 1  . atorvastatin (LIPITOR) 20 MG tablet Take 1 tablet (20 mg total) by mouth daily. 90 tablet 0  . Multiple Vitamin (MULTIVITAMIN) tablet Take 1 tablet by mouth daily.    . budesonide-formoterol (SYMBICORT) 160-4.5 MCG/ACT inhaler Inhale 2 puffs into the lungs 2 (two) times daily. (Patient not taking: Reported on 06/24/2017) 1 Inhaler 5  . cyclobenzaprine (FLEXERIL) 10 MG tablet 1 tablet po QHS prn or up to BID prn (Patient not taking: Reported on 06/24/2017) 15 tablet 0  . naproxen (NAPROSYN) 500 MG tablet Take 1 tablet (500 mg total) by mouth 2 (two) times daily with a meal. (Patient not taking: Reported on  06/24/2017) 30 tablet 0   No current facility-administered medications on file prior to visit.     ROS as in subjective   Objective: Wt 287 lb (130.2 kg)   BMI 35.87 kg/m   General appearance: alert, no distress, WD/WN,  HEENT: normocephalic, sclerae anicteric, PERRLA, EOMi, nares patent, no discharge or erythema, pharynx normal Oral cavity: MMM, no lesions Neck: supple, no lymphadenopathy, no thyromegaly, no masses, no bruits  Heart: RRR, normal S1, S2, no murmurs Lungs: CTA bilaterally, no wheezes, rhonchi, or rales Extremities: no edema, no cyanosis, no clubbing Pulses: 2+ symmetric, upper and lower extremities, normal cap refill Neurological: alert, oriented x 3, CN2-12 intact, strength normal upper extremities and lower extremities, sensation normal throughout, DTRs 2+ throughout, no cerebellar signs, gait normal Psychiatric: normal affect, behavior normal, pleasant         Assessment  Encounter Diagnoses  Name Primary?  . 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 Paresthesia   . OSA (obstructive sleep apnea)       Plan: BPPV - discussed symptoms, concerns, treatment recommendations.   Begin trial of Meclizine, avoid sudden movements, hydrate well, and discuss usual time frame to see improvements.  If not much improved in the next 4-5 days, then call or  recheck.  paresthesia - unclear etiology.  Discussed possible causes.   Declines labs today.  Advised he return soon for fasting labs and yearly physical  OSA - given the paresthesia, restart CPAP   Jammie was seen today for dizziness in morning.  Diagnoses and all orders for this visit:  Benign paroxysmal positional vertigo due to bilateral vestibular disorder  Paresthesia  OSA (obstructive sleep apnea)  Other orders -     meclizine (ANTIVERT) 25 MG tablet; Take 1 tablet (25 mg total) by mouth 2 (two) times daily.

## 2017-07-26 DIAGNOSIS — F32A Depression, unspecified: Secondary | ICD-10-CM

## 2017-07-26 HISTORY — DX: Depression, unspecified: F32.A

## 2017-07-29 ENCOUNTER — Ambulatory Visit (INDEPENDENT_AMBULATORY_CARE_PROVIDER_SITE_OTHER): Payer: Managed Care, Other (non HMO) | Admitting: Medical

## 2017-07-29 ENCOUNTER — Encounter: Payer: Self-pay | Admitting: Medical

## 2017-07-29 VITALS — BP 124/88 | HR 96 | Ht 75.5 in | Wt 283.4 lb

## 2017-07-29 DIAGNOSIS — Z0000 Encounter for general adult medical examination without abnormal findings: Secondary | ICD-10-CM

## 2017-07-29 DIAGNOSIS — E785 Hyperlipidemia, unspecified: Secondary | ICD-10-CM | POA: Diagnosis not present

## 2017-07-29 DIAGNOSIS — G4733 Obstructive sleep apnea (adult) (pediatric): Secondary | ICD-10-CM

## 2017-07-29 DIAGNOSIS — J309 Allergic rhinitis, unspecified: Secondary | ICD-10-CM

## 2017-07-29 DIAGNOSIS — Z125 Encounter for screening for malignant neoplasm of prostate: Secondary | ICD-10-CM | POA: Diagnosis not present

## 2017-07-29 DIAGNOSIS — G8929 Other chronic pain: Secondary | ICD-10-CM

## 2017-07-29 DIAGNOSIS — J45991 Cough variant asthma: Secondary | ICD-10-CM | POA: Diagnosis not present

## 2017-07-29 DIAGNOSIS — E669 Obesity, unspecified: Secondary | ICD-10-CM | POA: Diagnosis not present

## 2017-07-29 DIAGNOSIS — M25561 Pain in right knee: Secondary | ICD-10-CM | POA: Diagnosis not present

## 2017-07-29 DIAGNOSIS — N481 Balanitis: Secondary | ICD-10-CM

## 2017-07-29 DIAGNOSIS — L209 Atopic dermatitis, unspecified: Secondary | ICD-10-CM

## 2017-07-29 MED ORDER — TERBINAFINE HCL 1 % EX CREA: 1.0000 "application " | TOPICAL_CREAM | Freq: Two times a day (BID) | CUTANEOUS | 0 refills | Status: DC

## 2017-07-29 NOTE — Progress Notes (Signed)
Subjective:   HPI  Devin Erickson is a 47 y.o. male who presents for a complete physical.   Concerns: Compliant with CPAP  Rash irritated at tip of penis, few weeks ago.  Flaky skin  Reviewed their medical, surgical, family, social, medication, and allergy history and updated chart as appropriate.  Past Medical History:  Diagnosis Date  . Anxiety   . Bipolar disorder (HCC)   . Depression   . Eczema   . H/O exercise stress test 2009   [facility], normal per pt  . Hair loss    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  . History of sleep apnea 04/2012   per overnight screen/oximetry   . Obesity   . Thalassemia minor   . Wears glasses   . Wears glasses     Past Surgical History:  Procedure Laterality Date  . COLONOSCOPY  2012   [facility], normal per pt  . TONSILLECTOMY AND ADENOIDECTOMY     age 39yo    Social History   Socioeconomic History  . Marital status: Married    Spouse name: Not on file  . Number of children: Not on file  . Years of education: Not on file  . Highest education level: Not on file  Occupational History  . Not on file  Social Needs  . Financial resource strain: Not on file  . Food insecurity:    Worry: Not on file    Inability: Not on file  . Transportation needs:    Medical: Not on file    Non-medical: Not on file  Tobacco Use  . Smoking status: Never Smoker  . Smokeless tobacco: Never Used  Substance and Sexual Activity  . Alcohol use: No    Comment: few beers a week.maybe 3-4  . Drug use: No  . Sexual activity: Not on file  Lifestyle  . Physical activity:    Days per week: Not on file    Minutes per session: Not on file  . Stress: Not on file  Relationships  . Social connections:    Talks on phone: Not on file    Gets together: Not on file    Attends religious service: Not on file    Active member of club or organization: Not on file    Attends meetings of clubs or organizations: Not on file    Relationship status: Not on  file  . Intimate partner violence:    Fear of current or ex partner: Not on file    Emotionally abused: Not on file    Physically abused: Not on file    Forced sexual activity: Not on file  Other Topics Concern  . 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  [place] Witness.  07/2017    Family History  Problem Relation Age of Onset  . Cancer Mother 54       colon  . Hypertension Mother   . Dementia Father   . Cancer Father        pancreas  . Heart disease Neg Hx   . Lung disease Neg Hx      Current Outpatient Medications:  .  albuterol (PROAIR HFA) 108 (90 Base) MCG/ACT inhaler, Inhale 2 puffs into the lungs every 6 (six) hours as needed for wheezing or shortness of breath., Disp: 8.5 Inhaler, Rfl: 1 .  atorvastatin (LIPITOR) 20 MG tablet, Take 1 tablet (20 mg total) by mouth daily., Disp:  90 tablet, Rfl: 0 .  budesonide-formoterol (SYMBICORT) 160-4.5 MCG/ACT inhaler, Inhale 2 puffs into the lungs 2 (two) times daily., Disp: 1 Inhaler, Rfl: 5 .  Multiple Vitamin (MULTIVITAMIN) tablet, Take 1 tablet by mouth daily., Disp: , Rfl:  .  cyclobenzaprine (FLEXERIL) 10 MG tablet, 1 tablet po QHS prn or up to BID prn (Patient not taking: Reported on 06/24/2017), Disp: 15 tablet, Rfl: 0 .  meclizine (ANTIVERT) 25 MG tablet, Take 1 tablet (25 mg total) by mouth 2 (two) times daily. (Patient not taking: Reported on 07/29/2017), Disp: 30 tablet, Rfl: 0 .  naproxen (NAPROSYN) 500 MG tablet, Take 1 tablet (500 mg total) by mouth 2 (two) times daily with a meal. (Patient not taking: Reported on 06/24/2017), Disp: 30 tablet, Rfl: 0 .  terbinafine (LAMISIL) 1 % cream, Apply 1 application topically 2 (two) times daily., Disp: 30 g, Rfl: 0  No Known Allergies   Review of Systems Constitutional: -fever, -chills, -sweats, -unexpected weight change, -decreased appetite, -fatigue Allergy: -sneezing, -itching, -congestion Dermatology: -changing  moles, --rash, -lumps ENT: -runny nose, -ear pain, -sore throat, -hoarseness, -sinus pain, -teeth pain, - ringing in ears, -hearing loss, -nosebleeds Cardiology: -chest pain, -palpitations, -swelling, -difficulty breathing when lying flat, -waking up short of breath Respiratory: -cough, -shortness of breath, -difficulty breathing with exercise or exertion, -wheezing, -coughing up blood Gastroenterology: -abdominal pain, -nausea, -vomiting, -diarrhea, -constipation, -blood in stool, -changes in bowel movement, -difficulty swallowing or eating Hematology: -bleeding, -bruising  Musculoskeletal: -joint aches, -muscle aches, -joint swelling, -back pain, -neck pain, -cramping, -changes in gait Ophthalmology: denies vision changes, eye redness, itching, discharge Urology: -burning with urination, -difficulty urinating, -blood in urine, -urinary frequency, -urgency, -incontinence Neurology: -headache, -weakness, -tingling, -numbness, -memory loss, -falls, -dizziness Psychology: -depressed mood, -agitation, +sleep problems     Objective:   Physical Exam  BP 124/88 (BP Location: Right Arm, Patient Position: Sitting, Cuff Size: Normal)   Pulse 96   Ht 6' 3.5" (1.918 m)   Wt 283 lb 6.4 oz (128.5 kg)   SpO2 92%   BMI 34.95 kg/m   Wt Readings from Last 3 Encounters:  07/29/17 283 lb 6.4 oz (128.5 kg)  06/24/17 287 lb (130.2 kg)  02/08/17 282 lb 4.8 oz (128.1 kg)   BP Readings from Last 3 Encounters:  07/29/17 124/88  02/08/17 132/78  12/21/16 131/79    General appearance: alert, no distress, WD/WN, AA male Skin: Old small round flat scars of forearms and legs, scattered few macules, no worrisome lesions, no body hair on scalp, axilla, genitalia for years HEENT: normocephalic, conjunctiva/corneas normal, sclerae anicteric, PERRLA, EOMi, nares with turbinated edema, clear discharge and bilat anterior nares with inferior and septal friable appearance with erythema, pharynx normal Oral cavity:  MMM, tongue normal, teeth in good repair Neck: supple, no lymphadenopathy, no thyromegaly, no masses, normal ROM, no bruits Chest: non tender, normal shape and expansion Heart: RRR, normal S1, S2, no murmurs Lungs: decreased breath sounds in general, no wheezes, rhonchi, or rales Abdomen: +bs, soft, non tender, non distended, no masses, no hepatomegaly, no splenomegaly, no bruits Back: non tender, normal ROM, no scoliosis Musculoskeletal: medial knee tender but no deformity, no laxity, no swelling.   otherwise upper extremities non tender, no obvious deformity, normal ROM throughout, lower extremities non tender, no obvious deformity, normal ROM throughout Extremities: no edema, no cyanosis, no clubbing Pulses: 2+ symmetric, upper and lower extremities, normal cap refill Neurological: alert, oriented x 3, CN2-12 intact, strength normal upper extremities and lower extremities,  sensation normal throughout, DTRs 2+ throughout, no cerebellar signs, gait normal Psychiatric: normal affect, behavior normal, pleasant  GU: patchy erythema with some skin flaking on glans penis, otherwise normal male external genitalia, circumcised, nontender, no masses, no hernia, no lymphadenopathy Rectal: anus normal tone, prostate WNL, but difficult to fully palpate    Assessment and Plan :    Encounter Diagnoses  Name Primary?  . Encounter for health maintenance examination in adult Yes  . Hyperlipidemia, unspecified hyperlipidemia type   . OSA (obstructive sleep apnea)   . Cough variant asthma vs UACS   . Allergic rhinitis, unspecified seasonality, unspecified trigger   . Atopic dermatitis, unspecified type   . Chronic pain of right knee   . Obesity without serious comorbidity, unspecified classification, unspecified obesity type   . Screening for prostate cancer   . Balanitis     Physical exam - discussed healthy lifestyle, diet, exercise, preventative care, vaccinations, and addressed their concerns.     OSA on CPAP - c/t CPAP work on losing more weight  Hyperlipidemia- compliant with medication  Allergic rhinitis- does fine on daily antihistamine  chronic pain right knee - consider referral to ortho vs steroid injection.  He will let me know  Obesity-discussed exercise, diet, discussed strategies for weight loss  Balanitis - begin cream below Lamisil x 1-2 weeks, f/u if not resolving  Follow-up pending labs   [name] was seen today for annual exam.  Diagnoses and all orders for this visit:  Encounter for health maintenance examination in adult -     Comprehensive metabolic panel -     CBC -     Hemoglobin A1c -     TSH -     Lipid panel -     PSA  Hyperlipidemia, unspecified hyperlipidemia type -     Lipid panel  OSA (obstructive sleep apnea)  Cough variant asthma vs UACS  Allergic rhinitis, unspecified seasonality, unspecified trigger  Atopic dermatitis, unspecified type  Chronic pain of right knee  Obesity without serious comorbidity, unspecified classification, unspecified obesity type  Screening for prostate cancer -     PSA  Balanitis  Other orders -     terbinafine (LAMISIL) 1 % cream; Apply 1 application topically 2 (two) times daily. -     Lipid panel -     PSA

## 2017-07-30 ENCOUNTER — Encounter: Payer: Self-pay | Admitting: Medical

## 2017-07-30 ENCOUNTER — Other Ambulatory Visit: Payer: Self-pay | Admitting: Medical

## 2017-07-30 ENCOUNTER — Telehealth: Payer: Self-pay

## 2017-07-30 DIAGNOSIS — R945 Abnormal results of liver function studies: Principal | ICD-10-CM

## 2017-07-30 DIAGNOSIS — R7989 Other specified abnormal findings of blood chemistry: Secondary | ICD-10-CM

## 2017-07-30 DIAGNOSIS — R0600 Dyspnea, unspecified: Secondary | ICD-10-CM

## 2017-07-30 LAB — CBC
Hematocrit: 39.7 % (ref 37.5–51.0)
Hemoglobin: 13.7 g/dL (ref 13.0–17.7)
MCH: 24.6 pg — ABNORMAL LOW (ref 26.6–33.0)
MCHC: 34.5 g/dL (ref 31.5–35.7)
MCV: 71 fL — ABNORMAL LOW (ref 79–97)
Platelets: 203 10*3/uL (ref 150–379)
RBC: 5.57 x10E6/uL (ref 4.14–5.80)
RDW: 17.8 % — ABNORMAL HIGH (ref 12.3–15.4)
WBC: 5 10*3/uL (ref 3.4–10.8)

## 2017-07-30 LAB — HEMOGLOBIN A1C
Est. average glucose Bld gHb Est-mCnc: 108 mg/dL
Hgb A1c MFr Bld: 5.4 % (ref 4.8–5.6)

## 2017-07-30 LAB — LIPID PANEL
Chol/HDL Ratio: 4.2 ratio (ref 0.0–5.0)
Cholesterol, Total: 208 mg/dL — ABNORMAL HIGH (ref 100–199)
HDL: 50 mg/dL (ref >39)
LDL Calculated: 137 mg/dL — ABNORMAL HIGH (ref 0–99)
Triglycerides: 104 mg/dL (ref 0–149)
VLDL Cholesterol Cal: 21 mg/dL (ref 5–40)

## 2017-07-30 LAB — COMPREHENSIVE METABOLIC PANEL
ALT: 28 IU/L (ref 0–44)
AST: 47 IU/L — ABNORMAL HIGH (ref 0–40)
Albumin/Globulin Ratio: 1.5 (ref 1.2–2.2)
Albumin: 4.4 g/dL (ref 3.5–5.5)
Alkaline Phosphatase: 51 IU/L (ref 39–117)
BUN/Creatinine Ratio: 11 (ref 9–20)
BUN: 11 mg/dL (ref 6–24)
Bilirubin Total: 1 mg/dL (ref 0.0–1.2)
CO2: 20 mmol/L (ref 20–29)
Calcium: 9.6 mg/dL (ref 8.7–10.2)
Chloride: 103 mmol/L (ref 96–106)
Creatinine, Ser: 0.97 mg/dL (ref 0.76–1.27)
GFR calc Af Amer: 106 mL/min/{1.73_m2} (ref >59)
GFR calc non Af Amer: 92 mL/min/{1.73_m2} (ref >59)
Globulin, Total: 2.9 g/dL (ref 1.5–4.5)
Glucose: 90 mg/dL (ref 65–99)
Potassium: 4 mmol/L (ref 3.5–5.2)
Sodium: 141 mmol/L (ref 134–144)
Total Protein: 7.3 g/dL (ref 6.0–8.5)

## 2017-07-30 LAB — TSH: TSH: 1.19 u[IU]/mL (ref 0.450–4.500)

## 2017-07-30 LAB — PSA: Prostate Specific Ag, Serum: 0.9 ng/mL (ref 0.0–4.0)

## 2017-07-30 MED ORDER — ALBUTEROL SULFATE HFA 108 (90 BASE) MCG/ACT IN AERS: 2.0000 | INHALATION_SPRAY | Freq: Four times a day (QID) | RESPIRATORY_TRACT | 1 refills | Status: DC | PRN

## 2017-07-30 MED ORDER — ROSUVASTATIN CALCIUM 20 MG PO TABS: 20.0000 mg | ORAL_TABLET | Freq: Every day | ORAL | 3 refills | Status: DC

## 2017-07-30 MED ORDER — BUDESONIDE-FORMOTEROL FUMARATE 160-4.5 MCG/ACT IN AERO: 2.0000 | INHALATION_SPRAY | Freq: Two times a day (BID) | RESPIRATORY_TRACT | 11 refills | Status: DC

## 2017-07-30 NOTE — Telephone Encounter (Signed)
Called patient, LVM to call back to discuss lab work. Left call back number.  

## 2017-07-30 NOTE — Telephone Encounter (Signed)
-----   Message from Jac Canavanavid S Tysinger, PA-C sent at 07/30/2017  9:55 AM EDT ----- Labs show 1 liver test mildly elevated, LDL cholesterol too high.  Rest of labs fine  I changed Lipitor to Crestor as I don't think the Lipitor is working well enough.   C/t rest of medications as usual  Recommendations for improving lipids:  Foods TO AVOID or limit - fried foods, high sugar foods, white bread, enriched flour, fast food, red meat, large amounts of cheese, processed foods such as little debbie cakes, cookies, pies, donuts, for example  Foods TO INCLUDE in the diet - whole grains such as whole grain pasta, whole grain bread, barley, steel cut oatmeal (not instant oatmeal), avocado, fish, green leafy vegetables, nuts, increased fiber in diet, and using olive oil in small amounts for cooking or as salad dressing vinaigrette.    Regarding the liver tests, lets recheck labs in 59mo (nurse visit)

## 2017-08-04 ENCOUNTER — Encounter: Payer: Self-pay | Admitting: Medical

## 2017-08-04 ENCOUNTER — Ambulatory Visit (INDEPENDENT_AMBULATORY_CARE_PROVIDER_SITE_OTHER): Payer: Managed Care, Other (non HMO) | Admitting: Medical

## 2017-08-04 VITALS — BP 130/88 | HR 88 | Ht 75.0 in | Wt 286.6 lb

## 2017-08-04 DIAGNOSIS — G8929 Other chronic pain: Secondary | ICD-10-CM | POA: Diagnosis not present

## 2017-08-04 DIAGNOSIS — M25561 Pain in right knee: Secondary | ICD-10-CM

## 2017-08-04 MED ORDER — LIDOCAINE-EPINEPHRINE 2 %-1:100000 IJ SOLN
0.5000 mL | Freq: Once | INTRAMUSCULAR | Status: AC
Start: 2017-08-04 — End: 2017-08-04
  Administered 2017-08-04: 0.5 mL via INTRADERMAL

## 2017-08-04 MED ORDER — HYDROCODONE-ACETAMINOPHEN 5-325 MG PO TABS: 1.0000 | ORAL_TABLET | Freq: Four times a day (QID) | ORAL | 0 refills | Status: DC | PRN

## 2017-08-04 MED ORDER — TRIAMCINOLONE ACETONIDE 40 MG/ML IJ SUSP
40.0000 mg | Freq: Once | INTRAMUSCULAR | Status: AC
Start: 2017-08-04 — End: 2017-08-04
  Administered 2017-08-04: 40 mg via INTRAMUSCULAR

## 2017-08-04 MED ORDER — LIDOCAINE HCL (PF) 1 % IJ SOLN
3.0000 mL | Freq: Once | INTRAMUSCULAR | Status: AC
  Administered 2017-08-04: 3 mL via INTRADERMAL

## 2017-08-04 MED ORDER — LIDOCAINE HCL 2 % IJ SOLN: 3.0000 mL | Freq: Once | INTRAMUSCULAR | Status: DC

## 2017-08-04 NOTE — Patient Instructions (Signed)
Recommendations:  We injected steroid into the knee joint today to help with chronic pain  For the next few days I recommend relative rest.   In other words, take it easy on the knee the next several days  I would like you to elevate the knee/leg on pillows and use Ice water pack for 20 minutes twice daily for the next 3-4 days  Use a cloth in between your skin and the ice water pack  For pain flare up you can use either Naprosyn 500mg  that I have prescribed   For worse or breakthrough pain you can use Hydrocodone/Norco pain medication, 1 tablet every 6 hours as needed.  However, this medication can cause drowsiness  In general use exercise such as elliptical machine, swimming, or activity that doesn't put a lot of impact on the knee such as running or jumping  If not improving in general, let me know

## 2017-08-04 NOTE — Progress Notes (Signed)
Subjective: Chief Complaint  Patient presents with  . Injections    right knee, continued stiffness    Here for ongoing right knee pain, stiffness.  Ongoing pains in right knee for over a year.   No recent injury or trauma, does get stiffness.  When he gets up after driving for any distance or sitting for long time, will get pain or numb feeling.   Feels like it takes a few minutes to get going.   No swelling, but does get stiff.   Not a lot of exercise currently.   Right knee tires out.  Left knee sometimes feels this way but not like the right knee.   No hip or ankle pain.   No physical therapy.   No other aggravating or relieving factors. No other complaint.  Past Medical History:  Diagnosis Date  . Anxiety   . 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 Eczema   . H/O exercise stress test 2009   [facility], normal per pt  . Hair loss    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  . History of sleep apnea 04/2012   per overnight screen/oximetry   . Obesity   . Thalassemia minor   . Wears glasses   . Wears glasses    Current Outpatient Medications on File Prior to Visit  Medication Sig Dispense Refill  . albuterol (PROAIR HFA) 108 (90 Base) MCG/ACT inhaler Inhale 2 puffs into the lungs every 6 (six) hours as needed for wheezing or shortness of breath. 18 g 1  . budesonide-formoterol (SYMBICORT) 160-4.5 MCG/ACT inhaler Inhale 2 puffs into the lungs 2 (two) times daily. 1 Inhaler 11  . Multiple Vitamin (MULTIVITAMIN) tablet Take 1 tablet by mouth daily.    . rosuvastatin (CRESTOR) 20 MG tablet Take 1 tablet (20 mg total) by mouth at bedtime. 90 tablet 3  . terbinafine (LAMISIL) 1 % cream Apply 1 application topically 2 (two) times daily. 30 g 0  . cyclobenzaprine (FLEXERIL) 10 MG tablet 1 tablet po QHS prn or up to BID prn (Patient not taking: Reported on 06/24/2017) 15 tablet 0  . meclizine (ANTIVERT) 25 MG tablet Take 1 tablet (25 mg total) by mouth 2 (two) times daily. (Patient not taking: Reported on  07/29/2017) 30 tablet 0  . naproxen (NAPROSYN) 500 MG tablet Take 1 tablet (500 mg total) by mouth 2 (two) times daily with a meal. (Patient not taking: Reported on 06/24/2017) 30 tablet 0   No current facility-administered medications on file prior to visit.    Past Surgical History:  Procedure Laterality Date  . COLONOSCOPY  2012   [facility], normal per pt  . TONSILLECTOMY AND ADENOIDECTOMY     age 5yo   ROS as in subjective   Objective: BP 130/88 (BP Location: Right Arm, Patient Position: Sitting, Cuff Size: Normal)   Pulse 88   Ht 6\' 3"  (1.905 m)   Wt 286 lb 9.6 oz (130 kg)   SpO2 95%   BMI 35.82 kg/m   Gen: wd, wn, nad Skin unremarkable Knee nontender, no pain with ROM, no laxity, no swelling, no deformity  Hips and ankles nontender, normal ROM Legs neurovascularly intact   Right knee xray 07/22/16 negative   Assessment: Encounter Diagnosis  Name Primary?  . Chronic pain of right knee Yes    Plan: Discussed symptoms, chronic issues, prior normal xray a year ago.   Gave option for therapy.  He requests trial of steroid injection  Discussed risk and  benefits of procedure.  Patient gives consent.  Cleaned and prepped area in usual sterile fashion.   Injected the right knee with a lateral approach with 40 mg of Kenalog in 3 cc of 1% Lidocaine with a 22-gauge 1.5 inch needle (10cc syringe).  Estimated blood loss less than 1 cc, patient tolerated procedure well.  Discussed aftercare.    Patient Instructions  Recommendations:  We injected steroid into the knee joint today to help with chronic pain  For the next few days I recommend relative rest.   In other words, take it easy on the knee the next several days  I would like you to elevate the knee/leg on pillows and use Ice water pack for 20 minutes twice daily for the next 3-4 days  Use a cloth in between your skin and the ice water pack  For pain flare up you can use either Naprosyn 500mg  that I have  prescribed   For worse or breakthrough pain you can use Hydrocodone/Norco pain medication, 1 tablet every 6 hours as needed.  However, this medication can cause drowsiness  In general use exercise such as elliptical machine, swimming, or activity that doesn't put a lot of impact on the knee such as running or jumping  If not improving in general, let me know   Devin Erickson was seen today for injections.  Diagnoses and all orders for this visit:  Chronic pain of right knee  Other orders -     HYDROcodone-acetaminophen (NORCO) 5-325 MG tablet; Take 1 tablet by mouth every 6 (six) hours as needed for moderate pain.

## 2017-08-09 ENCOUNTER — Other Ambulatory Visit: Payer: Self-pay | Admitting: Medical

## 2017-11-12 ENCOUNTER — Other Ambulatory Visit: Payer: Self-pay

## 2017-12-02 ENCOUNTER — Other Ambulatory Visit: Payer: Self-pay

## 2017-12-03 ENCOUNTER — Other Ambulatory Visit: Payer: Managed Care, Other (non HMO)

## 2017-12-03 DIAGNOSIS — R945 Abnormal results of liver function studies: Principal | ICD-10-CM

## 2017-12-03 DIAGNOSIS — R7989 Other specified abnormal findings of blood chemistry: Secondary | ICD-10-CM

## 2017-12-04 LAB — HEPATIC FUNCTION PANEL
ALT: 26 IU/L (ref 0–44)
AST: 40 IU/L (ref 0–40)
Albumin: 4.4 g/dL (ref 3.5–5.5)
Alkaline Phosphatase: 47 IU/L (ref 39–117)
Bilirubin Total: 0.5 mg/dL (ref 0.0–1.2)
Bilirubin, Direct: 0.13 mg/dL (ref 0.00–0.40)
Total Protein: 7.1 g/dL (ref 6.0–8.5)

## 2018-01-12 ENCOUNTER — Ambulatory Visit (INDEPENDENT_AMBULATORY_CARE_PROVIDER_SITE_OTHER): Payer: Managed Care, Other (non HMO) | Admitting: Medical

## 2018-01-12 VITALS — BP 120/84 | HR 76 | Temp 98.2°F | Resp 16 | Ht 75.0 in | Wt 277.6 lb

## 2018-01-12 DIAGNOSIS — R040 Epistaxis: Secondary | ICD-10-CM

## 2018-01-12 MED ORDER — MUPIROCIN 2 % EX OINT: 1.0000 "application " | TOPICAL_OINTMENT | Freq: Three times a day (TID) | CUTANEOUS | 0 refills | Status: DC

## 2018-01-12 NOTE — Progress Notes (Signed)
Subjective: Chief Complaint  Patient presents with  . nose bleeds    nose bleeds    Here for nosebleeds.  Has been here for this in past, about 2 years ago.  About 2 months ago nosebleeds started back, all right nostril.  Usually in morning, wondered if dry from CPAP machine.  Then started getting some various times per day.  No rhyme or reason.  No other bleeding.  Bright red.  sometimes takes 15min to stop, yesterday 25min.  No other aggravating or relieving factors. No other complaint.  Past Medical History:  Diagnosis Date  . Anxiety   . 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 Eczema   . H/O exercise stress test 2009   [facility], normal per pt  . Hair loss    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  . History of sleep apnea 04/2012   per overnight screen/oximetry   . Obesity   . Thalassemia minor   . Wears glasses   . Wears glasses    Current Outpatient Medications on File Prior to Visit  Medication Sig Dispense Refill  . albuterol (PROAIR HFA) 108 (90 Base) MCG/ACT inhaler Inhale 2 puffs into the lungs every 6 (six) hours as needed for wheezing or shortness of breath. 18 g 1  . budesonide-formoterol (SYMBICORT) 160-4.5 MCG/ACT inhaler Inhale 2 puffs into the lungs 2 (two) times daily. 1 Inhaler 11  . Multiple Vitamin (MULTIVITAMIN) tablet Take 1 tablet by mouth daily.    . rosuvastatin (CRESTOR) 20 MG tablet Take 1 tablet (20 mg total) by mouth at bedtime. 90 tablet 3   No current facility-administered medications on file prior to visit.    ROS  As in subjective    Objective: BP 120/84   Pulse 76   Temp 98.2 F (36.8 C) (Oral)   Resp 16   Ht 6\' 3"  (1.905 m)   Wt 277 lb 9.6 oz (125.9 kg)   SpO2 97%   BMI 34.70 kg/m   General appearance: alert, no distress, WD/WN,  HEENT: normocephalic, sclerae anicteric, nares patent,+erythema, no current bleeding, but there is dry mucous in nostrils, pharynx normal Oral cavity: MMM, no lesions Neck: supple, no lymphadenopathy, no  thyromegaly, no masses    Assessment: Encounter Diagnosis  Name Primary?  . Epistaxis Yes     Plan: Discussed exam findings of mucous, erythema but no current bleeder.  Begin ointment below TID x 7-10 days, gentle nasal saline flush once daily or BID, and can use OTC Ibuprofen for acute bleed. Discussed stopping nose bleeds.  If no improvement in the next several days recheck or if active bleed, come in.   Devin Erickson was seen today for nose bleeds.  Diagnoses and all orders for this visit:  Epistaxis  Other orders -     mupirocin ointment (BACTROBAN) 2 %; Place 1 application into the nose 3 (three) times daily.

## 2018-03-10 ENCOUNTER — Encounter: Payer: Self-pay | Admitting: Medical

## 2018-03-10 ENCOUNTER — Ambulatory Visit (INDEPENDENT_AMBULATORY_CARE_PROVIDER_SITE_OTHER): Payer: Managed Care, Other (non HMO) | Admitting: Medical

## 2018-03-10 VITALS — BP 130/80 | HR 69 | Temp 98.0°F | Resp 16 | Ht 75.0 in | Wt 283.0 lb

## 2018-03-10 DIAGNOSIS — R202 Paresthesia of skin: Secondary | ICD-10-CM | POA: Diagnosis not present

## 2018-03-10 DIAGNOSIS — R29898 Other symptoms and signs involving the musculoskeletal system: Secondary | ICD-10-CM | POA: Diagnosis not present

## 2018-03-10 DIAGNOSIS — R040 Epistaxis: Secondary | ICD-10-CM

## 2018-03-10 DIAGNOSIS — Z23 Encounter for immunization: Secondary | ICD-10-CM

## 2018-03-10 NOTE — Progress Notes (Signed)
Subjective: Chief Complaint  Patient presents with  . Nose bleed    nose bleeds hands cold numb   Here for nosebleeds.  He came in in September for the same.  He has continued to have nosebleeds regular for the past 2 months +, all right nostril.  Getting random nosebleeds, no specific trigger, no rhyme or reason.  No other bleeding.  sometimes takes to stop.  He notes ongoing problems with tingling in his fingertips bilaterally, no arm pain in general otherwise, no weakness, no neck pain.  But he does have slight decrease in range of motion of neck.  Denies alcohol use, eats of relatively healthy diet.  No CP, no SOB, no edema, no palpitatoins.  No other aggravating or relieving factors. No other complaint.  Past Medical History:  Diagnosis Date  . Anxiety   . 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 Eczema   . H/O exercise stress test 2009   [facility], normal per pt  . Hair loss    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  . History of sleep apnea 04/2012   per overnight screen/oximetry   . Obesity   . Thalassemia minor   . Wears glasses   . Wears glasses    Current Outpatient Medications on File Prior to Visit  Medication Sig Dispense Refill  . albuterol (PROAIR HFA) 108 (90 Base) MCG/ACT inhaler Inhale 2 puffs into the lungs every 6 (six) hours as needed for wheezing or shortness of breath. 18 g 1  . budesonide-formoterol (SYMBICORT) 160-4.5 MCG/ACT inhaler Inhale 2 puffs into the lungs 2 (two) times daily. 1 Inhaler 11  . Multiple Vitamin (MULTIVITAMIN) tablet Take 1 tablet by mouth daily.    . rosuvastatin (CRESTOR) 20 MG tablet Take 1 tablet (20 mg total) by mouth at bedtime. 90 tablet 3  . mupirocin ointment (BACTROBAN) 2 % Place 1 application into the nose 3 (three) times daily. (Patient not taking: Reported on 03/10/2018) 22 g 0   No current facility-administered medications on file prior to visit.    ROS  As in subjective    Objective: BP 130/80   Pulse 69   Temp 98 F  (36.7 C) (Oral)   Resp 16   Ht 6\' 3"  (1.905 m)   Wt 283 lb (128.4 kg)   SpO2 96%   BMI 35.37 kg/m     General appearence: alert, no distress, WD/WN,  HEENT: normocephalic, sclerae anicteric, PERRLA, EOMi, nares patent, right nasal septum with friable area likely the source of recent bleeding, otherwise no active bleeding or lesions, no discharge or erythema, pharynx normal Oral cavity: MMM, no lesions Neck: supple, no lymphadenopathy, no thyromegaly, no masses, decreased neck extension range of motion but rest the range of motion neck is normal Heart: RRR, normal S1, S2, no murmurs Lungs: CTA bilaterally, no wheezes, rhonchi, or rales Back: non tender Musculoskeletal: hands nontender no deformity no swelling, otherwise rest of upper extremities nontender, no swelling, no obvious deformity Extremities: no edema, no cyanosis, no clubbing Pulses: 2+ symmetric, upper and lower extremities, normal cap refill Neurological: Hands and arms with normal sensation strength and DTRs  Psychiatric: normal affect, behavior normal, pleasant    Assessment: Encounter Diagnoses  Name Primary?  . Epistaxis Yes  . Need for influenza vaccination   . Paresthesia   . Decreased ROM of neck      Plan: Regarding paresthesias, etiology unclear.   He has good pulses and cap refills, no obvious sign of CTS  or ulnar nerve syndrome.  He does have reduced neck ROM.  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 Offered testing for B12, lytes, nerve conduction study, neurology referral.  He will consider referral and let me know.     Epistaxis- at the end of the visit we were going to attempt silver nitrate to any obvious friable area but once we started examining for the actual bleeder, we got immediate bleeding in the superior portion of the right nare that was flowing freely.  I ended up using a nasal rocket to control the bleeding temporarily.  We called to  get him worked into ear nose and throat specialist immediately and at the time of discharge she was able to drive and had some control over the bleeding temporarily.  He will report to Dr. Allene PyoNewman's office immediately   Counseled on the influenza virus vaccine.  Vaccine information sheet given.  Influenza vaccine given after consent obtained.   [name] was seen today for nose bleed.  Diagnoses and all orders for this visit:  Epistaxis  Need for influenza vaccination -     Flu Vaccine QUAD 6+ mos PF IM (Fluarix Quad PF)  Paresthesia  Decreased ROM of neck

## 2018-04-04 IMAGING — DX DG KNEE COMPLETE 4+V*R*
4 series · 4 of 4 positions shown · non-contrast
Comparison: None.

CLINICAL DATA: Medial right knee pain for 2 months. No known
injury.

EXAM:
RIGHT KNEE - COMPLETE 4+ VIEW

[dg knee complete 4 views right (1 of 4)]
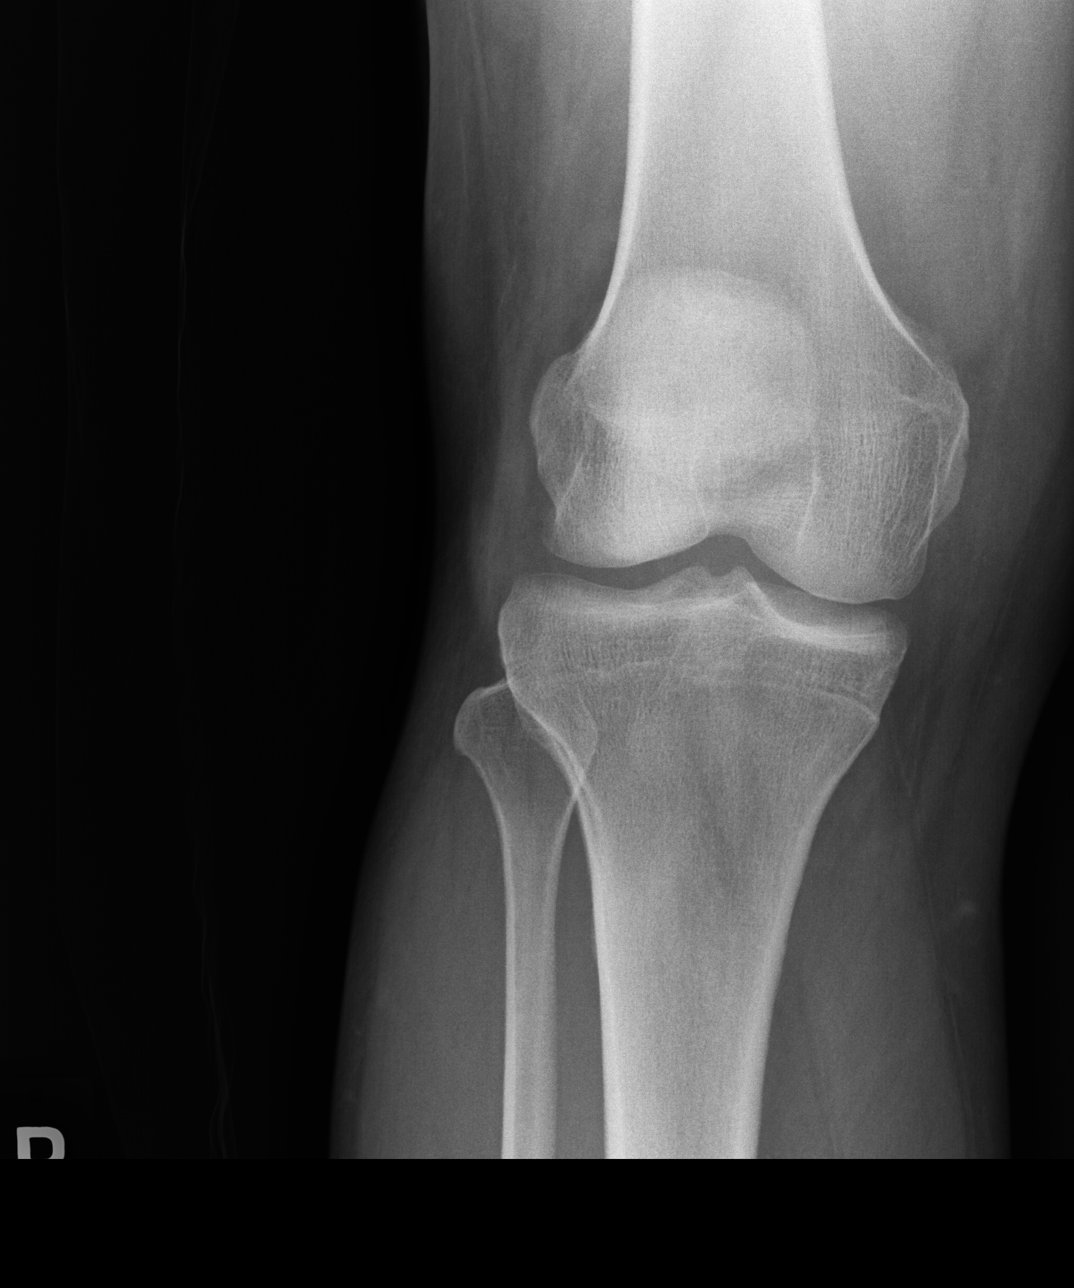

[dg knee complete 4 views right (2 of 4)]
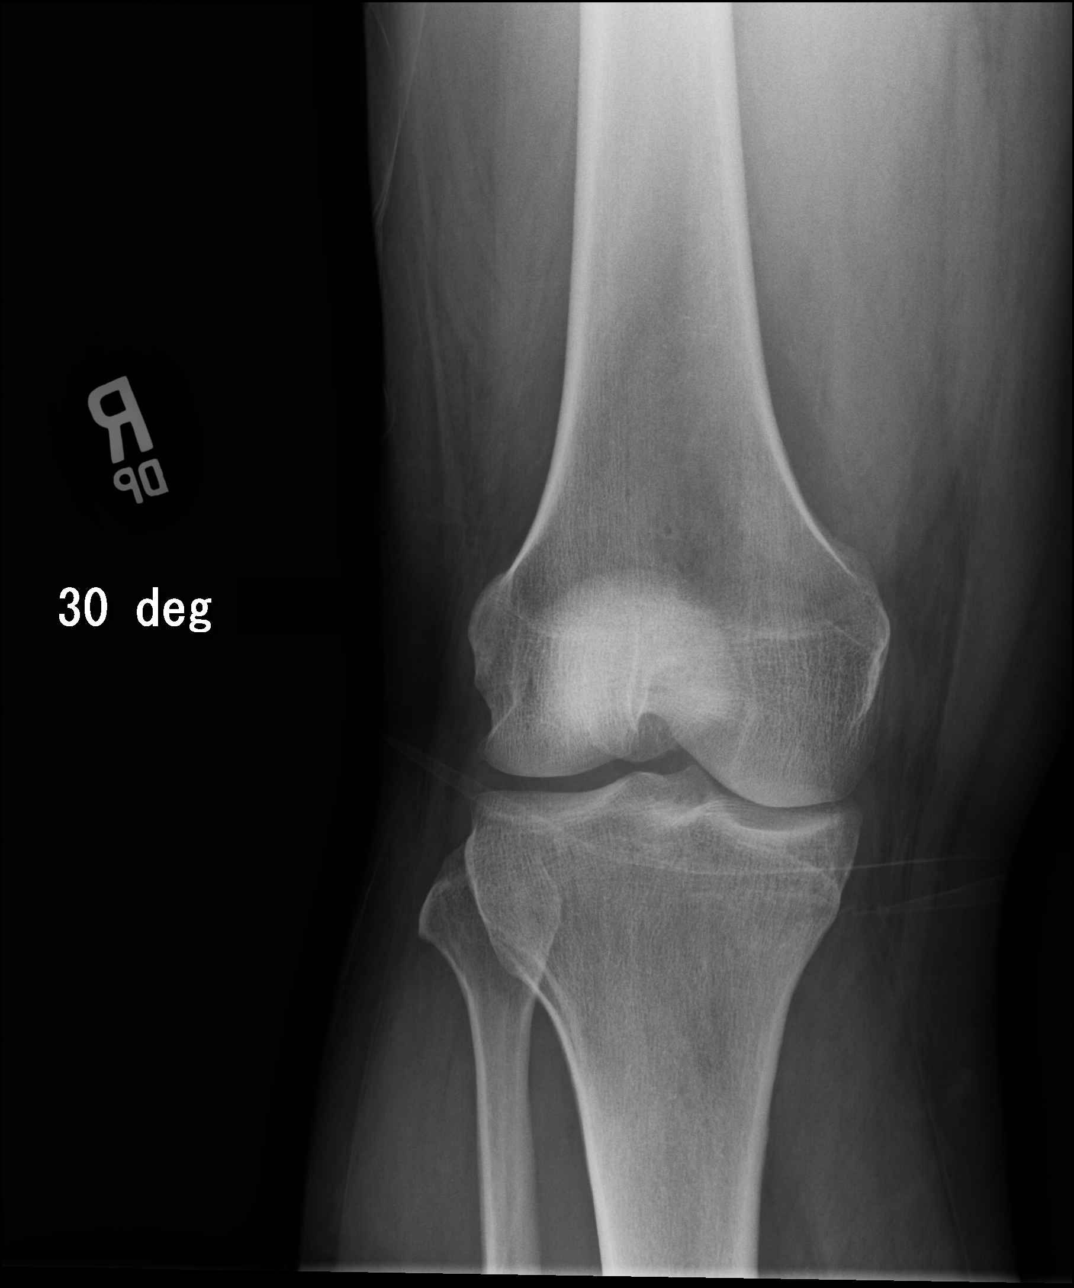

[dg knee complete 4 views right (3 of 4)]
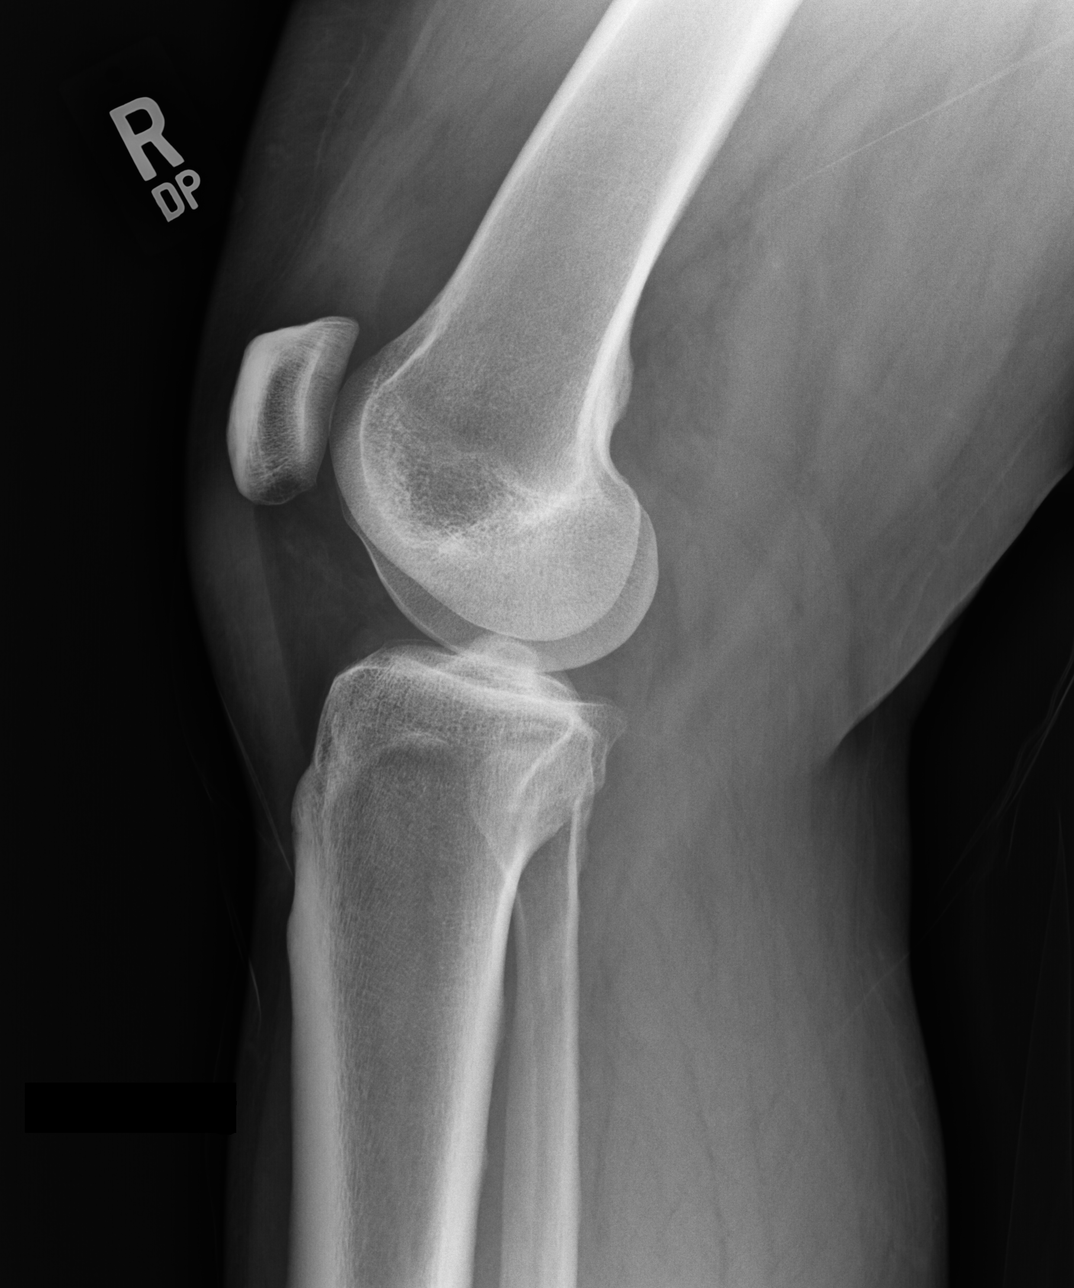

[dg knee complete 4 views right (4 of 4)]
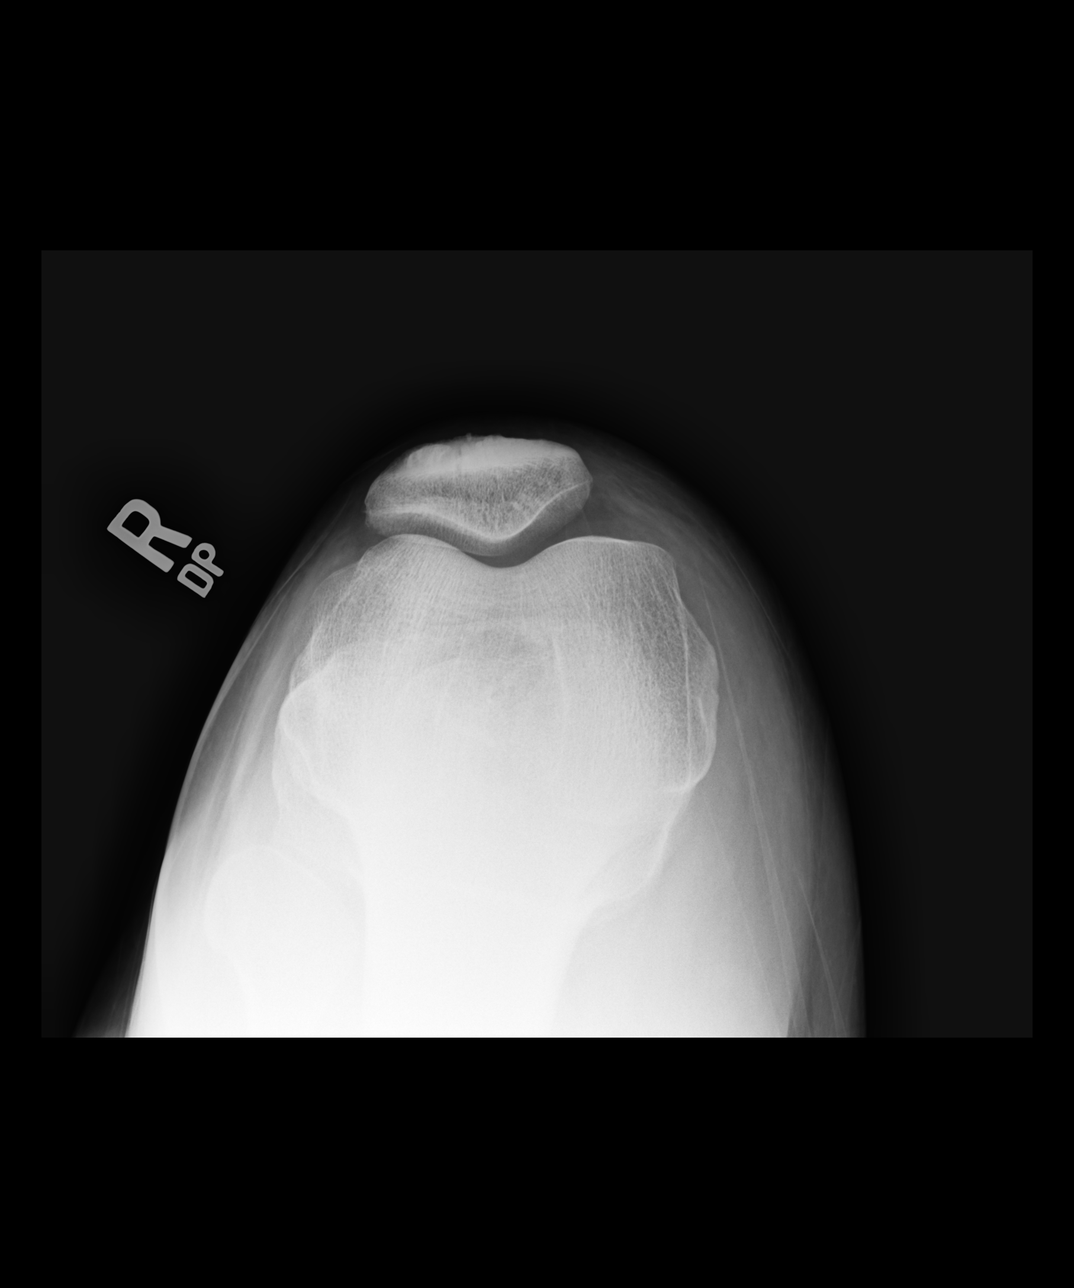

[4 of 4 positions shown; findings below may reference images not displayed]

FINDINGS: No evidence of fracture, dislocation, or joint effusion. No evidence
of arthropathy or other focal bone abnormality. Soft tissues are
unremarkable.
IMPRESSION: Negative.

## 2018-04-29 ENCOUNTER — Ambulatory Visit: Payer: Managed Care, Other (non HMO) | Admitting: Medical

## 2018-04-29 ENCOUNTER — Encounter: Payer: Self-pay | Admitting: Medical

## 2018-04-29 VITALS — BP 130/80 | HR 105 | Temp 98.3°F | Resp 16 | Ht 75.0 in | Wt 283.6 lb

## 2018-04-29 DIAGNOSIS — M25512 Pain in left shoulder: Secondary | ICD-10-CM

## 2018-04-29 DIAGNOSIS — M25561 Pain in right knee: Secondary | ICD-10-CM

## 2018-04-29 DIAGNOSIS — S39012A Strain of muscle, fascia and tendon of lower back, initial encounter: Secondary | ICD-10-CM | POA: Diagnosis not present

## 2018-04-29 DIAGNOSIS — G8929 Other chronic pain: Secondary | ICD-10-CM | POA: Diagnosis not present

## 2018-04-29 MED ORDER — CYCLOBENZAPRINE HCL 10 MG PO TABS: 10.0000 mg | ORAL_TABLET | Freq: Two times a day (BID) | ORAL | 0 refills | Status: DC | PRN

## 2018-04-29 MED ORDER — NAPROXEN 500 MG PO TABS: 500.0000 mg | ORAL_TABLET | Freq: Two times a day (BID) | ORAL | 0 refills | Status: DC

## 2018-04-29 NOTE — Progress Notes (Signed)
Subjective: Chief Complaint  Patient presents with  . back pain    back and shoulder pain X 10 days   knee pain    Here for ongoing pains.  He notes about this time last year had some pain in left shoulder when he pulled a muscle.  Feels like he has same thing but doesn't recall specific injury.  Been having pains in left shoulder x 1.5 weeks, using ibuprofen.  Not getting any sleep due to pain.   having to sleep on one side due to pain.  He notes   Has been doing some work out in the garden.  Has some pain in right of late, but after injection of steroid in knee months ago, had good repossess.  However in last week having some numb feeling or discomfort in right knee.     Past Medical History:  Diagnosis Date  . Anxiety   . 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 Eczema   . H/O exercise stress test 2009   [facility], normal per pt  . Hair loss    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  . History of sleep apnea 04/2012   per overnight screen/oximetry   . Obesity   . Thalassemia minor   . Wears glasses   . Wears glasses    Past Surgical History:  Procedure Laterality Date  . COLONOSCOPY  2012   [facility], normal per pt  . TONSILLECTOMY AND ADENOIDECTOMY     age 5yo   ROS as in subjective   Objective: BP 130/80   Pulse (!) 105   Temp 98.3 F (36.8 C) (Oral)   Resp 16   Ht 6\' 3"  (1.905 m)   Wt 283 lb 9.6 oz (128.6 kg)   SpO2 97%   BMI 35.45 kg/m   Gen: wd, wn, nad Skin unremarkable Musculoskeletal: Tender over left shoulder in general, mild pain with range of motion above 80 degrees in general, decreased range of motion internal and external range of motion of left shoulder, otherwise arm nontender, no deformity, no laxity, right knee nontender, no laxity, no swelling, no deformity, relatively normal exam.  Arms and legs neurovascular intact Back without tenderness   Assessment: Encounter Diagnoses  Name Primary?  . Chronic left shoulder pain Yes  . Chronic pain  of right knee   . Back strain, initial encounter       Plan: Left shoulder pain back strain, inflammation of the shoulder girdle-medications as below, relative rest, stretching, and follow-up if not much improved within 2 weeks  Right knee pain- medications as below, relative rest, can alternate ice and heat therapy, and if not improving the next 2 weeks referral to orthopedics.  Of note, he did get a good response to steroid injection back in April 2019 here with me.  I reviewed his left shoulder x-ray and right knee x-ray from 2018 as well as cervical spine x-ray.  Chanden was seen today for back pain.  Diagnoses and all orders for this visit:  Chronic left shoulder pain  Chronic pain of right knee  Back strain, initial encounter  Other orders -     naproxen (NAPROSYN) 500 MG tablet; Take 1 tablet (500 mg total) by mouth 2 (two) times daily with a meal. -     cyclobenzaprine (FLEXERIL) 10 MG tablet; Take 1 tablet (10 mg total) by mouth 2 (two) times daily as needed.

## 2018-05-02 DIAGNOSIS — G8929 Other chronic pain: Secondary | ICD-10-CM | POA: Insufficient documentation

## 2018-05-02 DIAGNOSIS — M25512 Pain in left shoulder: Principal | ICD-10-CM

## 2018-05-02 DIAGNOSIS — S39012A Strain of muscle, fascia and tendon of lower back, initial encounter: Secondary | ICD-10-CM | POA: Insufficient documentation

## 2018-07-01 ENCOUNTER — Ambulatory Visit: Payer: Managed Care, Other (non HMO) | Admitting: Medical

## 2018-07-01 VITALS — BP 130/80 | HR 98 | Wt 275.0 lb

## 2018-07-01 DIAGNOSIS — M25561 Pain in right knee: Secondary | ICD-10-CM

## 2018-07-01 DIAGNOSIS — G8929 Other chronic pain: Secondary | ICD-10-CM

## 2018-07-01 MED ORDER — TRIAMCINOLONE ACETONIDE 40 MG/ML IJ SUSP
40.0000 mg | Freq: Once | INTRAMUSCULAR | Status: AC
  Administered 2018-07-01: 40 mg via INTRAMUSCULAR

## 2018-07-01 MED ORDER — LIDOCAINE HCL 1 % IJ SOLN
30.0000 mL | Freq: Once | INTRAMUSCULAR | Status: AC
Start: 2018-07-01 — End: 2018-07-01
  Administered 2018-07-01: 30 mL via INTRADERMAL

## 2018-07-01 NOTE — Progress Notes (Signed)
Subjective: Chief Complaint  Patient presents with  . shot in knee    right knee    Here for ongoing right knee pain, stiffness.  Ongoing pains in right knee for weeks.   No recent injury or trauma, does get stiffness.  When he gets up after driving for any distance or sitting for long time, will get pain or numb feeling.   Feels like it takes a few minutes to get going.   No swelling, but does get stiff.   Not a lot of exercise currently.   Right knee tires out.  Left knee sometimes feels this way but not like the right knee.   No hip or ankle pain.   No physical therapy.   He responded well to steroid injection 07/2017.  No other aggravating or relieving factors. No other complaint.  Past Medical History:  Diagnosis Date  . Anxiety   . 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 Eczema   . H/O exercise stress test 2009   [facility], normal per pt  . Hair loss    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  . History of sleep apnea 04/2012   per overnight screen/oximetry   . Obesity   . Thalassemia minor   . Wears glasses   . Wears glasses    Current Outpatient Medications on File Prior to Visit  Medication Sig Dispense Refill  . albuterol (PROAIR HFA) 108 (90 Base) MCG/ACT inhaler Inhale 2 puffs into the lungs every 6 (six) hours as needed for wheezing or shortness of breath. 18 g 1  . budesonide-formoterol (SYMBICORT) 160-4.5 MCG/ACT inhaler Inhale 2 puffs into the lungs 2 (two) times daily. 1 Inhaler 11  . Multiple Vitamin (MULTIVITAMIN) tablet Take 1 tablet by mouth daily.    . rosuvastatin (CRESTOR) 20 MG tablet Take 1 tablet (20 mg total) by mouth at bedtime. 90 tablet 3   No current facility-administered medications on file prior to visit.    Past Surgical History:  Procedure Laterality Date  . COLONOSCOPY  2012   [facility], normal per pt  . TONSILLECTOMY AND ADENOIDECTOMY     age 5yo   ROS as in subjective   Objective: BP 130/80   Pulse 98   Wt 275 lb (124.7 kg)   BMI 34.37  kg/m   Gen: wd, wn, nad Skin unremarkable Knee nontender, no pain with ROM, no laxity, no swelling, no deformity  Hips and ankles nontender, normal ROM Legs neurovascularly intact   Right knee xray 07/22/16 negative   Assessment: Encounter Diagnosis  Name Primary?  . Chronic pain of right knee Yes    Plan: Discussed symptoms, chronic issues, prior normal xray a year ago.   Gave option for therapy.  He requests trial of steroid injection  Discussed risk and benefits of procedure.  Patient gives consent.  Cleaned and prepped area in usual sterile fashion.   Injected the right knee with a lateral approach with 40 mg of Kenalog in 3 cc of 1% Lidocaine with a 22-gauge 1.5 inch needle (10cc syringe).  Estimated blood loss less than 1 cc, patient tolerated procedure well.  Discussed aftercare.    He will recheck within 2 to 3 weeks let me know how he is doing.  Consider orthopedic referral if ongoing problems with the knee  There are no Patient Instructions on file for this visit.  Aleksandr was seen today for shot in knee.  Diagnoses and all orders for this visit:  Chronic pain of  right knee -     triamcinolone acetonide (KENALOG-40) injection 40 mg -     lidocaine (XYLOCAINE) 1 % (with pres) injection 30 mL

## 2018-07-11 ENCOUNTER — Encounter: Payer: Self-pay | Admitting: Medical

## 2018-07-11 ENCOUNTER — Other Ambulatory Visit: Payer: Self-pay

## 2018-07-11 ENCOUNTER — Ambulatory Visit: Payer: Managed Care, Other (non HMO) | Admitting: Medical

## 2018-07-11 VITALS — BP 120/80 | HR 72 | Temp 98.0°F | Resp 16 | Ht 75.0 in | Wt 274.6 lb

## 2018-07-11 DIAGNOSIS — R059 Cough, unspecified: Secondary | ICD-10-CM

## 2018-07-11 DIAGNOSIS — R05 Cough: Secondary | ICD-10-CM

## 2018-07-11 DIAGNOSIS — R220 Localized swelling, mass and lump, head: Secondary | ICD-10-CM

## 2018-07-11 DIAGNOSIS — J301 Allergic rhinitis due to pollen: Secondary | ICD-10-CM | POA: Diagnosis not present

## 2018-07-11 NOTE — Progress Notes (Signed)
Subjective:     Patient ID: Devin Erickson, male   DOB: May 09, 1970, 48 y.o.   MRN: [id number]  HPI Chief Complaint  Patient presents with  . cough    cough, diarrhea, facial swelling X friday   Here reports mild cough, some upper chest with some congestion.   Has slight runny nose.  No sneezing, no fever, no body aches or chills.  2 days ago has some new facial swelling on right face.  By the next day swelling resolved.   Was puffy around right cheek.  No teeth pain.  Has had some ear pain.  No redness or warmth of the puffy area of the face this weekend.   Has had some watery itchy eyes.   Thinks his symptoms are all allergy related.   Wife has been feeling a little bad in general, but not any other specific symptoms.  No known exposure to Coronavirus patient.  No recent travel.  Had 1 sick contact at work with vomiting last week.  No other aggravating or relieving factors. No other complaint.   Past Medical History:  Diagnosis Date  . Anxiety   . 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 Eczema   . H/O exercise stress test 2009   [facility], normal per pt  . Hair loss    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  . History of sleep apnea 04/2012   per overnight screen/oximetry   . Obesity   . Thalassemia minor   . Wears glasses   . Wears glasses    Current Outpatient Medications on File Prior to Visit  Medication Sig Dispense Refill  . albuterol (PROAIR HFA) 108 (90 Base) MCG/ACT inhaler Inhale 2 puffs into the lungs every 6 (six) hours as needed for wheezing or shortness of breath. 18 g 1  . budesonide-formoterol (SYMBICORT) 160-4.5 MCG/ACT inhaler Inhale 2 puffs into the lungs 2 (two) times daily. 1 Inhaler 11  . Multiple Vitamin (MULTIVITAMIN) tablet Take 1 tablet by mouth daily.    . rosuvastatin (CRESTOR) 20 MG tablet Take 1 tablet (20 mg total) by mouth at bedtime. 90 tablet 3   No current facility-administered medications on file prior to visit.      Review of Systems As in  subjective     Objective:   Physical Exam BP 120/80   Pulse 72   Temp 98 F (36.7 C) (Oral)   Resp 16   Ht 6\' 3"  (1.905 m)   Wt 274 lb 9.6 oz (124.6 kg)   BMI 34.32 kg/m   General appearance: alert, no distress, WD/WN,  HEENT: normocephalic, sclerae anicteric, TMs pearly, right facial puffiness over preauricular area, nares patent, no discharge or erythema, pharynx normal Oral cavity: MMM, no lesions Neck: supple, slight swelling right anterior neck, but no obvious lymphadenopathy, no thyromegaly, no masses Heart: RRR, normal S1, S2, no murmurs Lungs: CTA bilaterally, no wheezes, rhonchi, or rales Abdomen: +bs, soft, non tender, non distended, no masses, no hepatomegaly, no splenomegaly Pulses: 2+ symmetric, upper and lower extremities, normal cap refill      Assessment:     Encounter Diagnoses  Name Primary?  . Facial swelling Yes  . Cough   . Allergic rhinitis due to pollen, unspecified seasonality        Plan:     No obvious cause identified.  There is slight right cheek puffiness.  Discussed possible causes that could include parotid gland inflammation , cellulitis, or other.  He has mild allergy symptom as  well.  Begin OTC antihistamine such as Zyrtec, hydrate well, and if worse swelling in right face again, begin some lemon drops.   Otherwise non ill appearing and only mild findings today without obvious illness.   Advised he call or return if any changes in symptom in the next few days.

## 2018-08-07 ENCOUNTER — Other Ambulatory Visit: Payer: Self-pay | Admitting: Medical

## 2018-08-08 NOTE — Telephone Encounter (Signed)
Will refill for 90 days since no physicals can be scheduled right now right now

## 2018-08-29 ENCOUNTER — Ambulatory Visit: Payer: Managed Care, Other (non HMO) | Admitting: Medical

## 2018-08-29 ENCOUNTER — Other Ambulatory Visit: Payer: Self-pay

## 2018-08-29 ENCOUNTER — Encounter: Payer: Self-pay | Admitting: Medical

## 2018-08-29 VITALS — Temp 98.5°F | Ht 75.0 in | Wt 260.0 lb

## 2018-08-29 DIAGNOSIS — M25512 Pain in left shoulder: Secondary | ICD-10-CM | POA: Diagnosis not present

## 2018-08-29 DIAGNOSIS — M7582 Other shoulder lesions, left shoulder: Secondary | ICD-10-CM

## 2018-08-29 DIAGNOSIS — M778 Other enthesopathies, not elsewhere classified: Secondary | ICD-10-CM | POA: Insufficient documentation

## 2018-08-29 NOTE — Progress Notes (Signed)
Subjective:     Patient ID: Devin Erickson, male   DOB: 15-Aug-1970, 48 y.o.   MRN: [id number]  This visit type was conducted due to national recommendations for restrictions regarding the COVID-19 Pandemic (e.g. social distancing) in an effort to limit this patient's exposure and mitigate transmission in our community.  Due to their co-morbid illnesses, this patient is at least at moderate risk for complications without adequate follow up.  This format is felt to be most appropriate for this patient at this time.    Documentation for virtual audio and video telecommunications through Zoom encounter:  The patient was located at home. The provider was located in the office. The patient did consent to this visit and is aware of possible charges through their insurance for this visit.  The other persons participating in this telemedicine service were none. Time spent on call was 15 minutes and in review of previous records >15 minutes total.  This virtual service is not related to other E/M service within previous 7 days.   HPI Chief Complaint  Patient presents with  . left shoulder pain    left shoulder stiff and wont lift too much X 1 week   Here for left shoulder pain.   Its not continually in pain, but if moving in upwards, feels stiff and twinges in pain.  If pushing further is painful.   No recent injury, trauma, or fall.   Thinks he may have aggravated it at home either in the yard of working out with weights.   Pain is on the side, in the muscle.   No pain if arm straight out holding an item.  Only hurts lifting left shoulder to the side like a chicken wing move.  Has difficulty trying to bend arm back behind head down back.   No other parts of left arm hurting.   Been doing lots of pushups, using some resistance bands and kettle bell.    No numbness, no tingling, no weakness.   No neck or back pain.   Been using some ibuprofen at bedtime, generally 400mg , 2 of the OTC ibuprofen.   Sleeping on right side to avoid pain.  I saw him back in January for the same left shoulder pain and he noted that resolved and has not been a problem until the last week or so.  He has been gradually increasing his push-ups for the past month, starting at only 5 push-ups a day but now up to 60 push-ups per day.  Doing kettle bell and other exercises that he has gradually worked up to as well.  No other aggravating or relieving factors. No other complaint.   Review of Systems As in subjective    Objective:   Physical Exam Due to coronavirus pandemic stay at home measures, patient visit was virtual and they were not examined in person.   Gen: wd, wn, nad Exam done virtually over zoom.   No obvious arm redness or bruising Neck range of motion is full Shoulder range of motion is full bilaterally, he seems to have some mild pain with left shoulder flexion and abduction at about 100 degrees particularly if holding a container, but he still has full range of motion.  No obvious pain with other special test.  His internal range of motion is mildly decreased on the right.      Assessment:     Encounter Diagnoses  Name Primary?  . Shoulder tendonitis, left Yes  . Acute pain of left shoulder  Plan:     Your symptoms and exam suggest left shoulder tendinitis  We are limited in that this is a virtual visit and not in person visit  Recommendations  I recommend you use ice water pack for 20 minutes on, 20 minutes off, applied to the left shoulder the next 3 to 4 days.  This is to help with pain and inflammation of the shoulder  You can continue over-the-counter ibuprofen 3 tablets twice daily for the next 5 to 7 days for pain and inflammation  Use the rotator cuff resistance band exercises as demonstrated, 30 repetitions each direction daily  Do not do push-ups or kettle bells for the next 3 to 5 days  Continue routine stretching  After 5 to 7 days if feeling much improved, then  get back to doing push-ups but start at 30 push-ups daily and work-up from that point gradually  Make sure when you do your kettle bell exercises or push-ups that you do these in a controlled motion and not any fast or uncontrolled fashion that would cause injury  If not much improved within the next week then call back  Of note you had a normal left shoulder x-ray 2 years ago  [name] was seen today for left shoulder pain.  Diagnoses and all orders for this visit:  Shoulder tendonitis, left  Acute pain of left shoulder

## 2018-08-29 NOTE — Patient Instructions (Signed)
Your symptoms and exam suggest left shoulder tendinitis  We are limited in that this is a virtual visit and not in person visit  Recommendations  I recommend you use ice water pack for 20 minutes on, 20 minutes off, applied to the left shoulder the next 3 to 4 days.  This is to help with pain and inflammation of the shoulder  You can continue over-the-counter ibuprofen 3 tablets twice daily for the next 5 to 7 days for pain and inflammation  Use the rotator cuff resistance band exercises as demonstrated, 30 repetitions each direction daily  Do not do push-ups or kettle bells for the next 3 to 5 days  Continue routine stretching  After 5 to 7 days if feeling much improved, then get back to doing push-ups but start at 30 push-ups daily and work-up from that point gradually  Make sure when you do your kettle bell exercises or push-ups that you do these in a controlled motion and not any fast or uncontrolled fashion that would cause injury  If not much improved within the next week then call back  Of note you had a normal left shoulder x-ray 2 years ago

## 2018-11-10 ENCOUNTER — Other Ambulatory Visit: Payer: Self-pay | Admitting: Medical

## 2018-12-14 ENCOUNTER — Other Ambulatory Visit: Payer: Self-pay

## 2018-12-14 ENCOUNTER — Other Ambulatory Visit: Payer: Self-pay | Admitting: Podiatry

## 2018-12-14 ENCOUNTER — Ambulatory Visit: Payer: Managed Care, Other (non HMO) | Admitting: Podiatry

## 2018-12-14 ENCOUNTER — Ambulatory Visit (INDEPENDENT_AMBULATORY_CARE_PROVIDER_SITE_OTHER): Payer: Managed Care, Other (non HMO)

## 2018-12-14 ENCOUNTER — Encounter: Payer: Self-pay | Admitting: Podiatry

## 2018-12-14 VITALS — BP 117/70 | HR 70 | Temp 97.6°F | Resp 16

## 2018-12-14 DIAGNOSIS — M79672 Pain in left foot: Secondary | ICD-10-CM

## 2018-12-14 DIAGNOSIS — Q828 Other specified congenital malformations of skin: Secondary | ICD-10-CM | POA: Diagnosis not present

## 2018-12-14 DIAGNOSIS — M722 Plantar fascial fibromatosis: Secondary | ICD-10-CM

## 2018-12-14 MED ORDER — DICLOFENAC SODIUM 75 MG PO TBEC: 75.0000 mg | DELAYED_RELEASE_TABLET | Freq: Two times a day (BID) | ORAL | 2 refills | Status: DC

## 2018-12-14 NOTE — Progress Notes (Signed)
   Subjective:    Patient ID: Devin Erickson, male    DOB: 1970-12-14, 48 y.o.   MRN: [id number]  HPI    Review of Systems  All other systems reviewed and are negative.      Objective:   Physical Exam        Assessment & Plan:

## 2018-12-14 NOTE — Progress Notes (Signed)
Subjective:   Patient ID: Devin Erickson, male   DOB: 48 y.o.   MRN: [id number]   HPI Patient presents stating he has had a lot of pain in the bottom of the left heel and it is been going on for about a year and is been gradually getting worse.  Patient states he is tried different shoe options and ice therapy without relief and patient does not smoke and likes to be active   Review of Systems  All other systems reviewed and are negative.       Objective:  Physical Exam Vitals signs and nursing note reviewed.  Constitutional:      Appearance: He is well-developed.  Pulmonary:     Effort: Pulmonary effort is normal.  Musculoskeletal: Normal range of motion.  Skin:    General: Skin is warm.  Neurological:     Mental Status: He is alert.     Neurovascular status intact muscle strength found to be adequate range of motion within normal limits.  Patient is found to have exquisite discomfort plantar heel left at the insertional point of the tendon into the calcaneus with inflammation fluid buildup around the medial band.  Patient is noted to have good digital perfusion and is well oriented x3     Assessment:  Acute plantar fasciitis left with inflammation fluid buildup noted     Plan:  H&P reviewed condition and explained acute element of condition and injected the left plantar fascia 3 mg Kenalog 5 g Xylocaine applied fascial brace gave instructions for physical therapy and supportive shoe gear usage.  He also had several lesions on the right which I debrided today with no iatrogenic bleeding and explained porokeratosis to him  X-rays indicate significant plantar spur formation left with moderate depression of the arch

## 2018-12-14 NOTE — Patient Instructions (Signed)

## 2018-12-30 ENCOUNTER — Encounter: Payer: Self-pay | Admitting: Podiatry

## 2018-12-30 ENCOUNTER — Ambulatory Visit: Payer: Managed Care, Other (non HMO) | Admitting: Podiatry

## 2018-12-30 ENCOUNTER — Other Ambulatory Visit: Payer: Self-pay

## 2018-12-30 DIAGNOSIS — Q828 Other specified congenital malformations of skin: Secondary | ICD-10-CM | POA: Diagnosis not present

## 2018-12-30 DIAGNOSIS — M722 Plantar fascial fibromatosis: Secondary | ICD-10-CM | POA: Diagnosis not present

## 2019-01-05 NOTE — Progress Notes (Signed)
Subjective:   Patient ID: Devin Erickson, male   DOB: 48 y.o.   MRN: [id number]   HPI Patient states my heel is feeling quite a bit better with discomfort only if I been on it for too long of the time   ROS      Objective:  Physical Exam  Neurovascular status intact muscle strength was adequate moderate arch depression noted with discomfort plantar heel that is improved but is still present upon deep palpation.  Also noted several lesion formations which can become tender at times     Assessment:  Plantar fasciitis improving left along with patient who has porokeratotic lesions bilateral     Plan:  H&P reviewed both conditions and advised on stretching and shoe gear modification for the plantar fasciitis.  Lesions debrided discussed continued porokeratotic type care in the future and will be seen back as needed

## 2019-02-16 ENCOUNTER — Encounter: Payer: Self-pay | Admitting: Medical

## 2019-02-16 ENCOUNTER — Other Ambulatory Visit: Payer: Self-pay

## 2019-02-16 ENCOUNTER — Ambulatory Visit (INDEPENDENT_AMBULATORY_CARE_PROVIDER_SITE_OTHER): Payer: Managed Care, Other (non HMO) | Admitting: Medical

## 2019-02-16 VITALS — BP 110/84 | HR 76 | Temp 97.8°F | Ht 75.0 in | Wt 251.2 lb

## 2019-02-16 DIAGNOSIS — Z1211 Encounter for screening for malignant neoplasm of colon: Secondary | ICD-10-CM | POA: Diagnosis not present

## 2019-02-16 DIAGNOSIS — R040 Epistaxis: Secondary | ICD-10-CM

## 2019-02-16 DIAGNOSIS — M25561 Pain in right knee: Secondary | ICD-10-CM

## 2019-02-16 DIAGNOSIS — Z7189 Other specified counseling: Secondary | ICD-10-CM

## 2019-02-16 DIAGNOSIS — Z0000 Encounter for general adult medical examination without abnormal findings: Secondary | ICD-10-CM

## 2019-02-16 DIAGNOSIS — E669 Obesity, unspecified: Secondary | ICD-10-CM

## 2019-02-16 DIAGNOSIS — L209 Atopic dermatitis, unspecified: Secondary | ICD-10-CM

## 2019-02-16 DIAGNOSIS — Z125 Encounter for screening for malignant neoplasm of prostate: Secondary | ICD-10-CM | POA: Diagnosis not present

## 2019-02-16 DIAGNOSIS — Z23 Encounter for immunization: Secondary | ICD-10-CM | POA: Diagnosis not present

## 2019-02-16 DIAGNOSIS — J45991 Cough variant asthma: Secondary | ICD-10-CM

## 2019-02-16 DIAGNOSIS — G4733 Obstructive sleep apnea (adult) (pediatric): Secondary | ICD-10-CM

## 2019-02-16 DIAGNOSIS — N481 Balanitis: Secondary | ICD-10-CM

## 2019-02-16 DIAGNOSIS — R21 Rash and other nonspecific skin eruption: Secondary | ICD-10-CM

## 2019-02-16 DIAGNOSIS — L630 Alopecia (capitis) totalis: Secondary | ICD-10-CM

## 2019-02-16 DIAGNOSIS — G8929 Other chronic pain: Secondary | ICD-10-CM

## 2019-02-16 DIAGNOSIS — E785 Hyperlipidemia, unspecified: Secondary | ICD-10-CM

## 2019-02-16 DIAGNOSIS — G44229 Chronic tension-type headache, not intractable: Secondary | ICD-10-CM

## 2019-02-16 DIAGNOSIS — Z7185 Encounter for immunization safety counseling: Secondary | ICD-10-CM

## 2019-02-16 DIAGNOSIS — H8113 Benign paroxysmal vertigo, bilateral: Secondary | ICD-10-CM

## 2019-02-16 DIAGNOSIS — J309 Allergic rhinitis, unspecified: Secondary | ICD-10-CM

## 2019-02-16 DIAGNOSIS — M25512 Pain in left shoulder: Secondary | ICD-10-CM

## 2019-02-16 DIAGNOSIS — R7989 Other specified abnormal findings of blood chemistry: Secondary | ICD-10-CM

## 2019-02-16 NOTE — Progress Notes (Signed)
Subjective:   HPI  Devin Erickson is a 48 y.o. male who presents for a complete physical.   Concerns: He has been working hard to lose weight through healthy diet and exercise, has lost weight since May.  He quit using CPAP as his snoring went away and has felt much better.  He only uses it if he feels like he needs it  A few weeks ago he is not sure if he had a fungus infection of the penis or if his wife used a different detergent and caused a skin reaction but he had irritation redness and itching of the penis.  He has little discoloration of the tip of the penis.  Used cream over-the-counter.  It has improved.  Reviewed their medical, surgical, family, social, medication, and allergy history and updated chart as appropriate.  Past Medical History:  Diagnosis Date  . Anxiety   . 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 Eczema   . H/O exercise stress test 2009   [facility], normal per pt  . Hair loss    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  . History of sleep apnea 04/2012   per overnight screen/oximetry   . Obesity   . Thalassemia minor   . Wears glasses   . Wears glasses     Past Surgical History:  Procedure Laterality Date  . COLONOSCOPY  2012   [facility], normal per pt  . TONSILLECTOMY AND ADENOIDECTOMY     age 89yo    Social History   Socioeconomic History  . Marital status: Married    Spouse name: Not on file  . Number of children: Not on file  . Years of education: Not on file  . Highest education level: Not on file  Occupational History  . Not on file  Social Needs  . Financial resource strain: Not on file  . Food insecurity    Worry: Not on file    Inability: Not on file  . Transportation needs    Medical: Not on file    Non-medical: Not on file  Tobacco Use  . Smoking status: Never Smoker  . Smokeless tobacco: Never Used  Substance and Sexual Activity  . Alcohol use: No    Comment: few beers a week.maybe 3-4  . Drug use: No  . Sexual  activity: Not on file  Lifestyle  . Physical activity    Days per week: Not on file    Minutes per session: Not on file  . Stress: Not on file  Relationships  . Social Musicianconnections    Talks on phone: Not on file    Gets together: Not on file    Attends religious service: Not on file    Active member of club or organization: Not on file    Attends meetings of clubs or organizations: Not on file    Relationship status: Not on file  . Intimate partner violence    Fear of current or ex partner: Not on file    Emotionally abused: Not on file    Physically abused: Not on file    Forced sexual activity: Not on file  Other Topics Concern  . 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  [place] Witness.  07/2017    Family History  Problem Relation Age of Onset  . Cancer Mother [id number]       colon  . Hypertension Mother   .  Dementia Father   . Cancer Father        pancreas  . Heart disease Neg Hx   . Lung disease Neg Hx      Current Outpatient Medications:  Marland Kitchen  Multiple Vitamin (MULTIVITAMIN) tablet, Take 1 tablet by mouth daily., Disp: , Rfl:  .  rosuvastatin (CRESTOR) 20 MG tablet, TAKE 1 TABLET BY MOUTH EVERYDAY AT BEDTIME, Disp: 90 tablet, Rfl: 0 .  albuterol (PROAIR HFA) 108 (90 Base) MCG/ACT inhaler, Inhale 2 puffs into the lungs every 6 (six) hours as needed for wheezing or shortness of breath. (Patient not taking: Reported on 02/16/2019), Disp: 18 g, Rfl: 1 .  budesonide-formoterol (SYMBICORT) 160-4.5 MCG/ACT inhaler, Inhale 2 puffs into the lungs 2 (two) times daily. (Patient not taking: Reported on 02/16/2019), Disp: 1 Inhaler, Rfl: 11 .  diclofenac (VOLTAREN) 75 MG EC tablet, Take 1 tablet (75 mg total) by mouth 2 (two) times daily. (Patient not taking: Reported on 02/16/2019), Disp: 50 tablet, Rfl: 2  No Known Allergies   Review of Systems Constitutional: -fever, -chills, -sweats, -unexpected weight change, -decreased  appetite, -fatigue Allergy: -sneezing, -itching, -congestion Dermatology: -changing moles, +rash, -lumps ENT: -runny nose, -ear pain, -sore throat, -hoarseness, -sinus pain, -teeth pain, - ringing in ears, -hearing loss, -nosebleeds Cardiology: -chest pain, -palpitations, -swelling, -difficulty breathing when lying flat, -waking up short of breath Respiratory: -cough, -shortness of breath, -difficulty breathing with exercise or exertion, -wheezing, -coughing up blood Gastroenterology: -abdominal pain, -nausea, -vomiting, -diarrhea, -constipation, -blood in stool, -changes in bowel movement, -difficulty swallowing or eating Hematology: -bleeding, -bruising  Musculoskeletal: -joint aches, -muscle aches, -joint swelling, -back pain, -neck pain, -cramping, -changes in gait Ophthalmology: denies vision changes, eye redness, itching, discharge Urology: -burning with urination, -difficulty urinating, -blood in urine, -urinary frequency, -urgency, -incontinence Neurology: -headache, -weakness, -tingling, -numbness, -memory loss, -falls, -dizziness Psychology: -depressed mood, -agitation, +sleep problems     Objective:   Physical Exam  BP 110/84   Pulse 76   Temp 97.8 F (36.6 C)   Ht 6\' 3"  (1.905 m)   Wt 251 lb 3.2 oz (113.9 kg)   SpO2 98%   BMI 31.40 kg/m   Wt Readings from Last 3 Encounters:  02/16/19 251 lb 3.2 oz (113.9 kg)  08/29/18 260 lb (117.9 kg)  07/11/18 274 lb 9.6 oz (124.6 kg)   BP Readings from Last 3 Encounters:  02/16/19 110/84  12/14/18 117/70  07/11/18 120/80    General appearance: alert, no distress, WD/WN, AA male Skin: Old small round flat scars of forearms and legs, scattered few macules, no worrisome lesions, no body hair on scalp, axilla, genitalia for years HEENT: normocephalic, conjunctiva/corneas normal, sclerae anicteric, PERRLA, EOMi, nares patent, pharynx normal Oral cavity: MMM, tongue normal, teeth in good repair Neck: supple, no lymphadenopathy, no  thyromegaly, no masses, normal ROM, no bruits Chest: non tender, normal shape and expansion Heart: RRR, normal S1, S2, no murmurs Lungs: decreased breath sounds in general, no wheezes, rhonchi, or rales Abdomen: +bs, soft, non tender, non distended, no masses, no hepatomegaly, no splenomegaly, no bruits Back: non tender, normal ROM, no scoliosis Musculoskeletal: upper extremities non tender, no obvious deformity, normal ROM throughout, lower extremities non tender, no obvious deformity, normal ROM throughout Extremities: no edema, no cyanosis, no clubbing Pulses: 2+ symmetric, upper and lower extremities, normal cap refill Neurological: alert, oriented x 3, CN2-12 intact, strength normal upper extremities and lower extremities, sensation normal throughout, DTRs 2+ throughout, no cerebellar signs, gait normal Psychiatric: normal affect,  behavior normal, pleasant  GU: patchy erythema with some skin flaking on glans penis, otherwise normal male external genitalia, circumcised, nontender, no masses, no hernia, no lymphadenopathy Rectal: deferred    Assessment and Plan :    Encounter Diagnoses  Name Primary?  . Encounter for health maintenance examination in adult Yes  . Need for influenza vaccination   . Screening for prostate cancer   . Screen for colon cancer   . Vaccine counseling   . Allergic rhinitis, unspecified seasonality, unspecified trigger   . OSA (obstructive sleep apnea)   . Cough variant asthma vs UACS   . Chronic tension-type headache, not intractable   . 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 Atopic dermatitis, unspecified type   . Obesity without serious comorbidity, unspecified classification, unspecified obesity type   . Epistaxis   . Hyperlipidemia, unspecified hyperlipidemia type   . Chronic pain of right knee   . Chronic left shoulder pain   . Rash   . Balanitis   . Alopecia totalis     Physical exam - discussed healthy  lifestyle, diet, exercise, preventative care, vaccinations, and addressed their concerns.  Counseled on the influenza virus vaccine.  Vaccine information sheet given.  Influenza vaccine given after consent obtained.  See eye doctor and dentist yearly  Referral for colonoscopy.  Saw [facility] in the past 2011.  Referral back to [facility] him on his weight loss.  He may not need the CPAP at this point given the improvements.  The other problem list issues are not currently an issue although they have been on and off issues including epistaxis, shoulder pain, knee pain.  His recent balanitis had improved.  We discussed his elevated LDL, low HDL 5 years ago .  He has no other risk factors.  We discussed risks/benefits of statin.   He is currently compliant.     Follow-up pending labs   Amine was seen today for annual exam.  Diagnoses and all orders for this visit:  Encounter for health maintenance examination in adult -     Comprehensive metabolic panel -     CBC -     Lipid panel -     PSA  Need for influenza vaccination -     Flu Vaccine QUAD 6+ mos PF IM (Fluarix Quad PF)  Screening for prostate cancer  Screen for colon cancer -     Ambulatory referral to Gastroenterology  Vaccine counseling  Allergic rhinitis, unspecified seasonality, unspecified trigger  OSA (obstructive sleep apnea)  Cough variant asthma vs UACS  Chronic tension-type headache, not intractable  Benign paroxysmal positional vertigo due to bilateral vestibular disorder  Atopic dermatitis, unspecified type  Obesity without serious comorbidity, unspecified classification, unspecified obesity type  Epistaxis  Hyperlipidemia, unspecified hyperlipidemia type  Chronic pain of right knee  Chronic left shoulder pain  Rash  Balanitis  Alopecia totalis

## 2019-02-17 LAB — LIPID PANEL
Chol/HDL Ratio: 3.7 ratio (ref 0.0–5.0)
Cholesterol, Total: 216 mg/dL — ABNORMAL HIGH (ref 100–199)
HDL: 59 mg/dL (ref >39)
LDL Chol Calc (NIH): 139 mg/dL — ABNORMAL HIGH (ref 0–99)
Triglycerides: 100 mg/dL (ref 0–149)
VLDL Cholesterol Cal: 18 mg/dL (ref 5–40)

## 2019-02-17 LAB — COMPREHENSIVE METABOLIC PANEL
ALT: 55 IU/L — ABNORMAL HIGH (ref 0–44)
AST: 88 IU/L — ABNORMAL HIGH (ref 0–40)
Albumin/Globulin Ratio: 1.6 (ref 1.2–2.2)
Albumin: 4.5 g/dL (ref 4.0–5.0)
Alkaline Phosphatase: 49 IU/L (ref 39–117)
BUN/Creatinine Ratio: 14 (ref 9–20)
BUN: 16 mg/dL (ref 6–24)
Bilirubin Total: 1 mg/dL (ref 0.0–1.2)
CO2: 24 mmol/L (ref 20–29)
Calcium: 9.4 mg/dL (ref 8.7–10.2)
Chloride: 100 mmol/L (ref 96–106)
Creatinine, Ser: 1.17 mg/dL (ref 0.76–1.27)
GFR calc Af Amer: 84 mL/min/{1.73_m2} (ref >59)
GFR calc non Af Amer: 72 mL/min/{1.73_m2} (ref >59)
Globulin, Total: 2.8 g/dL (ref 1.5–4.5)
Glucose: 85 mg/dL (ref 65–99)
Potassium: 4.3 mmol/L (ref 3.5–5.2)
Sodium: 139 mmol/L (ref 134–144)
Total Protein: 7.3 g/dL (ref 6.0–8.5)

## 2019-02-17 LAB — CBC
Hematocrit: 41.5 % (ref 37.5–51.0)
Hemoglobin: 13.2 g/dL (ref 13.0–17.7)
MCH: 24.4 pg — ABNORMAL LOW (ref 26.6–33.0)
MCHC: 31.8 g/dL (ref 31.5–35.7)
MCV: 77 fL — ABNORMAL LOW (ref 79–97)
Platelets: 185 10*3/uL (ref 150–450)
RBC: 5.42 x10E6/uL (ref 4.14–5.80)
RDW: 17.3 % — ABNORMAL HIGH (ref 11.6–15.4)
WBC: 5.7 10*3/uL (ref 3.4–10.8)

## 2019-02-17 LAB — PSA: Prostate Specific Ag, Serum: 1.2 ng/mL (ref 0.0–4.0)

## 2019-02-21 ENCOUNTER — Other Ambulatory Visit: Payer: Self-pay | Admitting: Medical

## 2019-02-21 DIAGNOSIS — R7989 Other specified abnormal findings of blood chemistry: Secondary | ICD-10-CM

## 2019-03-07 LAB — SPECIMEN STATUS REPORT

## 2019-03-07 LAB — HEPATITIS PANEL, ACUTE
Hep A IgM: NEGATIVE
Hep B C IgM: NEGATIVE
Hep C Virus Ab: <0.1 {s_co_ratio} (ref 0.0–0.9)
Hepatitis B Surface Ag: NEGATIVE

## 2019-03-07 LAB — IRON: Iron: 116 ug/dL (ref 38–169)

## 2019-03-07 LAB — CK: Total CK: 1719 U/L (ref 49–439)

## 2019-03-08 DIAGNOSIS — R7989 Other specified abnormal findings of blood chemistry: Secondary | ICD-10-CM | POA: Insufficient documentation

## 2019-04-05 ENCOUNTER — Ambulatory Visit: Payer: Managed Care, Other (non HMO) | Admitting: Podiatry

## 2019-04-05 ENCOUNTER — Other Ambulatory Visit: Payer: Self-pay

## 2019-04-05 ENCOUNTER — Encounter: Payer: Self-pay | Admitting: Podiatry

## 2019-04-05 DIAGNOSIS — M779 Enthesopathy, unspecified: Secondary | ICD-10-CM

## 2019-04-05 DIAGNOSIS — B079 Viral wart, unspecified: Secondary | ICD-10-CM | POA: Diagnosis not present

## 2019-04-05 NOTE — Progress Notes (Signed)
Subjective:   Patient ID: Devin Erickson, male   DOB: 48 y.o.   MRN: [id number]   HPI Patient states he has a wart on the bottom of his right foot that is been very sore and it feels like there is fluid buildup on the side and states the tendon on the left is doing better   ROS      Objective:  Physical Exam  Neurovascular status intact with lateral band mid plantar fasciitis tendinitis right and the lesion which may be porokeratotic right or possibly verruca plantaris with pain     Assessment:  Possibility for mid tendinitis lateral side right plantar foot along with possibility for porokeratotic lesion for verruca plantaris right     Plan:  Reviewed condition at this point did inject the fascial band lateral side tendon right 3 mg Kenalog 10 mg Xylocaine I then used sharp sterile instrumentation and debrided the tissue and then applied chemical agent to create response with hopeful that this will solve the problem.  Sterile dressing applied

## 2019-07-07 ENCOUNTER — Ambulatory Visit: Payer: Managed Care, Other (non HMO) | Admitting: Medical

## 2019-07-07 LAB — HM COLONOSCOPY

## 2019-07-10 ENCOUNTER — Encounter: Payer: Self-pay | Admitting: Medical

## 2019-07-10 ENCOUNTER — Ambulatory Visit: Payer: Managed Care, Other (non HMO) | Admitting: Medical

## 2019-07-10 VITALS — BP 140/80 | HR 95 | Temp 98.2°F | Ht 75.0 in | Wt 257.0 lb

## 2019-07-10 DIAGNOSIS — R5383 Other fatigue: Secondary | ICD-10-CM | POA: Diagnosis not present

## 2019-07-10 DIAGNOSIS — I7300 Raynaud's syndrome without gangrene: Secondary | ICD-10-CM

## 2019-07-10 DIAGNOSIS — R040 Epistaxis: Secondary | ICD-10-CM

## 2019-07-10 DIAGNOSIS — L630 Alopecia (capitis) totalis: Secondary | ICD-10-CM

## 2019-07-10 DIAGNOSIS — R200 Anesthesia of skin: Secondary | ICD-10-CM | POA: Diagnosis not present

## 2019-07-10 DIAGNOSIS — N529 Male erectile dysfunction, unspecified: Secondary | ICD-10-CM | POA: Diagnosis not present

## 2019-07-10 DIAGNOSIS — R768 Other specified abnormal immunological findings in serum: Secondary | ICD-10-CM

## 2019-07-10 DIAGNOSIS — Z9189 Other specified personal risk factors, not elsewhere classified: Secondary | ICD-10-CM

## 2019-07-10 MED ORDER — HYDROCODONE-ACETAMINOPHEN 5-325 MG PO TABS: 1.0000 | ORAL_TABLET | Freq: Four times a day (QID) | ORAL | 0 refills | Status: DC | PRN

## 2019-07-10 NOTE — Progress Notes (Signed)
Subjective: Chief Complaint  Patient presents with  . Hand Pain    middle finger of right hand    Here for several concerns.    Had sleep apnea in the past but this improved with weight loss.  No recent snoring or witnessed apnea, but does note fatigue.  Main concern today is right middle fingertip feels numb and has skin thickening changes on end of finger.  Left middle finger similar but not as bad.  No injury, no trauma, no swelling, no arthritis pain of hands.  No discoloration.  Still gets nosebleeds intermittent including a bad one today that took a lot time to stop.  Sex drive is down at times, erections could be better.  Wants to check testosterone again.  Past Medical History:  Diagnosis Date  . Anxiety   . 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 Eczema   . H/O exercise stress test 2009   [facility], normal per pt  . Hair loss    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  . History of sleep apnea 04/2012   per overnight screen/oximetry   . Obesity   . Thalassemia minor   . Wears glasses   . Wears glasses    Current Outpatient Medications on File Prior to Visit  Medication Sig Dispense Refill  . Multiple Vitamin (MULTIVITAMIN) tablet Take 1 tablet by mouth daily.    . rosuvastatin (CRESTOR) 20 MG tablet TAKE 1 TABLET BY MOUTH EVERYDAY AT BEDTIME 90 tablet 0  . albuterol (PROAIR HFA) 108 (90 Base) MCG/ACT inhaler Inhale 2 puffs into the lungs every 6 (six) hours as needed for wheezing or shortness of breath. (Patient not taking: Reported on 07/10/2019) 18 g 1  . budesonide-formoterol (SYMBICORT) 160-4.5 MCG/ACT inhaler Inhale 2 puffs into the lungs 2 (two) times daily. (Patient not taking: Reported on 07/10/2019) 1 Inhaler 11  . diclofenac (VOLTAREN) 75 MG EC tablet Take 1 tablet (75 mg total) by mouth 2 (two) times daily. (Patient not taking: Reported on 07/10/2019) 50 tablet 2   No current facility-administered medications on file prior to visit.   ROS as in  subjective   Objective: BP 140/80   Pulse 95   Temp 98.2 F (36.8 C)   Ht 6\' 3"  (1.905 m)   Wt 257 lb (116.6 kg)   SpO2 96%   BMI 32.12 kg/m   Gen: wd, wn, nad Total alopecia for several years Lungs clear Heart rrr, normal s1s2, no murmurs Right distal phalanx with cool sensation, unable to get pulse oximetry on right distal phalanx only but able to get normal pulse ox all other fingers.  There is no obvious discoloration of fingers, but there is some thickening of skin on fingertips of both middle fingers suggestive of recent raynaud's flare.   Ext: no edema Pulses 2+, normal cap refill except right middle finger distal phalanx.     Assessment: Encounter Diagnoses  Name Primary?  . Fatigue, unspecified type Yes  . Finger numbness   . Raynaud's phenomenon without gangrene   . Erectile dysfunction, unspecified erectile dysfunction type   . Epistaxis   . At high risk for bleeding   . ANA positive   . Alopecia areata totalis      Plan: Discussed his current symptoms, abnormal exam findings.   Discussed prior 2017 eval with rheumatology and labs at that time including +ANA.     Given history of total alopecia, raynaud's symptoms, chronic epistaxis, fatigue, discussed case with Dr. Redmond School supervising physician.  Additional labs today as discussed.  Consider updated rheumatology consult. F/u with ENT if needed for epistaxis  discussed possible blood flow study in hands  If worse symptoms in the next few days such as cold fingers, worse pain or numbness, get evaluated immediately in the emergency dept.     Jakevion was seen today for hand pain.  Diagnoses and all orders for this visit:  Fatigue, unspecified type -     Testosterone -     Hemoglobinopathy Evaluation -     Pathologist smear review -     Von Willebrand panel -     Sedimentation rate -     Glucose 6 phosphate dehydrogenase -     Hgb Fractionation Cascade  Finger numbness -     Testosterone -      Hemoglobinopathy Evaluation -     Pathologist smear review -     Von Willebrand panel -     Sedimentation rate -     Glucose 6 phosphate dehydrogenase -     Hgb Fractionation Cascade  Raynaud's phenomenon without gangrene -     Testosterone -     Hemoglobinopathy Evaluation -     Pathologist smear review -     Von Willebrand panel -     Sedimentation rate -     Glucose 6 phosphate dehydrogenase -     Hgb Fractionation Cascade  Erectile dysfunction, unspecified erectile dysfunction type -     Testosterone -     Hemoglobinopathy Evaluation -     Pathologist smear review -     Von Willebrand panel -     Sedimentation rate -     Glucose 6 phosphate dehydrogenase -     Hgb Fractionation Cascade  Epistaxis -     Testosterone -     Hemoglobinopathy Evaluation -     Pathologist smear review -     Von Willebrand panel -     Sedimentation rate -     Glucose 6 phosphate dehydrogenase -     Hgb Fractionation Cascade  At high risk for bleeding -     Testosterone -     Hemoglobinopathy Evaluation -     Pathologist smear review -     Von Willebrand panel -     Sedimentation rate -     Glucose 6 phosphate dehydrogenase -     Hgb Fractionation Cascade  ANA positive -     Testosterone -     Hemoglobinopathy Evaluation -     Pathologist smear review -     Von Willebrand panel -     Sedimentation rate -     Glucose 6 phosphate dehydrogenase -     Hgb Fractionation Cascade  Alopecia areata totalis -     Testosterone -     Hemoglobinopathy Evaluation -     Pathologist smear review -     Von Willebrand panel -     Sedimentation rate -     Glucose 6 phosphate dehydrogenase -     Hgb Fractionation Cascade  Other orders -     HYDROcodone-acetaminophen (NORCO) 5-325 MG tablet; Take 1 tablet by mouth every 6 (six) hours as needed.

## 2019-07-12 ENCOUNTER — Encounter: Payer: Self-pay | Admitting: Medical

## 2019-07-12 ENCOUNTER — Other Ambulatory Visit: Payer: Self-pay | Admitting: Medical

## 2019-07-12 DIAGNOSIS — I7300 Raynaud's syndrome without gangrene: Secondary | ICD-10-CM

## 2019-07-12 DIAGNOSIS — R768 Other specified abnormal immunological findings in serum: Secondary | ICD-10-CM

## 2019-07-12 DIAGNOSIS — R040 Epistaxis: Secondary | ICD-10-CM

## 2019-07-12 DIAGNOSIS — R5383 Other fatigue: Secondary | ICD-10-CM

## 2019-07-12 DIAGNOSIS — L630 Alopecia (capitis) totalis: Secondary | ICD-10-CM

## 2019-07-12 DIAGNOSIS — R200 Anesthesia of skin: Secondary | ICD-10-CM

## 2019-07-12 LAB — PATHOLOGIST SMEAR REVIEW
Basophils Absolute: 0.1 10*3/uL (ref 0.0–0.2)
Basos: 1 %
EOS (ABSOLUTE): 0.2 10*3/uL (ref 0.0–0.4)
Eos: 4 %
Hematocrit: 42.7 % (ref 37.5–51.0)
Hemoglobin: 13.3 g/dL (ref 13.0–17.7)
Immature Grans (Abs): 0 10*3/uL (ref 0.0–0.1)
Immature Granulocytes: 0 %
Lymphocytes Absolute: 2 10*3/uL (ref 0.7–3.1)
Lymphs: 34 %
MCH: 23.3 pg — ABNORMAL LOW (ref 26.6–33.0)
MCHC: 31.1 g/dL — ABNORMAL LOW (ref 31.5–35.7)
MCV: 75 fL — ABNORMAL LOW (ref 79–97)
Monocytes Absolute: 0.6 10*3/uL (ref 0.1–0.9)
Monocytes: 11 %
Neutrophils Absolute: 2.9 10*3/uL (ref 1.4–7.0)
Neutrophils: 50 %
Path Rev PLTs: NORMAL
Path Rev WBC: NORMAL
Platelets: 237 10*3/uL (ref 150–450)
RBC: 5.72 x10E6/uL (ref 4.14–5.80)
RDW: 18 % — ABNORMAL HIGH (ref 11.6–15.4)
WBC: 5.8 10*3/uL (ref 3.4–10.8)

## 2019-07-12 LAB — SEDIMENTATION RATE: Sed Rate: 10 mm/hr (ref 0–30)

## 2019-07-12 LAB — VON WILLEBRAND PANEL
Factor VIII Activity: 206 % — ABNORMAL HIGH (ref 56–140)
Von Willebrand Ag: 188 % (ref 50–200)
Von Willebrand Factor: 94 % (ref 50–200)

## 2019-07-12 LAB — GLUCOSE 6 PHOSPHATE DEHYDROGENASE: G-6-PD, Quant: 10.2 U/g{Hb} (ref 3.8–14.2)

## 2019-07-12 LAB — TESTOSTERONE: Testosterone: 216 ng/dL — ABNORMAL LOW (ref 264–916)

## 2019-07-12 LAB — COAG STUDIES INTERP REPORT

## 2019-07-13 LAB — HGB FRACTIONATION BY HPLC
Hgb A2: 3 % (ref 1.8–3.2)
Hgb A: 65.7 % — ABNORMAL LOW (ref 96.4–98.8)
Hgb C: 31.2 % — ABNORMAL HIGH
Hgb E: 0 %
Hgb F: 0.1 % (ref 0.0–2.0)
Hgb S: 0 %
Hgb Variant: 0 %

## 2019-07-13 LAB — HGB FRACTIONATION CASCADE

## 2019-07-17 ENCOUNTER — Other Ambulatory Visit: Payer: Self-pay | Admitting: Medical

## 2019-07-17 ENCOUNTER — Telehealth: Payer: Self-pay

## 2019-07-17 DIAGNOSIS — I7300 Raynaud's syndrome without gangrene: Secondary | ICD-10-CM

## 2019-07-17 DIAGNOSIS — R200 Anesthesia of skin: Secondary | ICD-10-CM

## 2019-07-17 MED ORDER — AMLODIPINE BESYLATE 5 MG PO TABS: 5.0000 mg | ORAL_TABLET | Freq: Every day | ORAL | 1 refills | Status: DC

## 2019-07-17 MED ORDER — AMOXICILLIN-POT CLAVULANATE 875-125 MG PO TABS: 1.0000 | ORAL_TABLET | Freq: Two times a day (BID) | ORAL | 0 refills | Status: AC

## 2019-07-17 NOTE — Telephone Encounter (Signed)
Referral sent and patient has been informed.

## 2019-07-17 NOTE — Telephone Encounter (Signed)
See if we can get him in urgently to Vascular and Vein due to raynaud's, decreased pulse ox and cap refill to fingertips on specific fingers.   Try for appt today or tomorrow!  Begin Amlodipine which is a blood pressure medicaiton but can help with peripheral blood flow in this situation.  Its a once daily medication  Begin Augmentin antibiotic due to risk of infection given appearance of finger last time.    Continue plan for Rheumatology referral given other things to rule out, but lets get in vascular ASAP

## 2019-07-17 NOTE — Telephone Encounter (Signed)
Patient called and stated his finger is not healing. He stated it is not worsening just not healing. He will send pictures via mychart when he gets home from work.

## 2019-07-18 ENCOUNTER — Telehealth: Payer: Self-pay | Admitting: Medical

## 2019-07-18 NOTE — Telephone Encounter (Signed)
[facility] are currently trying to reach patient to get him scheduled.

## 2019-07-18 NOTE — Telephone Encounter (Signed)
I need status update from message yesterday.   He emailed back today.  Please call him and give me update on urgent vascular surgery consult

## 2019-07-19 ENCOUNTER — Telehealth: Payer: Self-pay | Admitting: Medical

## 2019-07-19 ENCOUNTER — Other Ambulatory Visit: Payer: Self-pay | Admitting: Medical

## 2019-07-19 DIAGNOSIS — R0989 Other specified symptoms and signs involving the circulatory and respiratory systems: Secondary | ICD-10-CM

## 2019-07-19 DIAGNOSIS — R200 Anesthesia of skin: Secondary | ICD-10-CM

## 2019-07-19 DIAGNOSIS — I7300 Raynaud's syndrome without gangrene: Secondary | ICD-10-CM

## 2019-07-19 DIAGNOSIS — R230 Cyanosis: Secondary | ICD-10-CM

## 2019-07-19 NOTE — Telephone Encounter (Signed)
Patient has been informed of his appointment on tomorrow at [facility]. 9am arriving at 8:45

## 2019-07-19 NOTE — Telephone Encounter (Signed)
Gen[facility], please schedule with [facility] vascular ultrasound dept STAT.  I need STAT arterial ultrasounds of hands, and STAT venous raynaud's ultrasound of hands.   He is off work tomorrow, Thursday 07/20/19 and available.  If possible, I'd like both tests tomorrow.   If not, I at least need the arterial study ASAP  FYI He is aware to expect a call about ultrasound appointments.   His wife is getting the amlodipine and antibiotic today I sent to his pharmacy several days ago.

## 2019-07-20 ENCOUNTER — Other Ambulatory Visit: Payer: Self-pay | Admitting: Medical

## 2019-07-20 ENCOUNTER — Other Ambulatory Visit: Payer: Self-pay

## 2019-07-20 ENCOUNTER — Ambulatory Visit (HOSPITAL_COMMUNITY)
Admission: RE | Admit: 2019-07-20 | Discharge: 2019-07-20 | Disposition: A | Discharge status: Home / Self Care | Payer: Managed Care, Other (non HMO) | Source: Ambulatory Visit | Attending: Medical | Admitting: Medical

## 2019-07-20 ENCOUNTER — Other Ambulatory Visit (HOSPITAL_COMMUNITY): Payer: Self-pay | Admitting: Cardiology

## 2019-07-20 DIAGNOSIS — R230 Cyanosis: Secondary | ICD-10-CM

## 2019-07-20 DIAGNOSIS — I7300 Raynaud's syndrome without gangrene: Secondary | ICD-10-CM | POA: Insufficient documentation

## 2019-07-20 DIAGNOSIS — R0989 Other specified symptoms and signs involving the circulatory and respiratory systems: Secondary | ICD-10-CM | POA: Diagnosis not present

## 2019-07-20 DIAGNOSIS — R200 Anesthesia of skin: Secondary | ICD-10-CM

## 2019-07-20 NOTE — Progress Notes (Signed)
VASCULAR LAB PRELIMINARY  PRELIMINARY  PRELIMINARY  PRELIMINARY  Upper extremity arterial evaluation completed.    Preliminary report:  See CV proc for preliminary results.   Laddie Math, RVT 07/20/2019, 9:44 AM

## 2019-08-10 ENCOUNTER — Other Ambulatory Visit: Payer: Self-pay | Admitting: Medical

## 2019-08-23 ENCOUNTER — Other Ambulatory Visit: Payer: Self-pay | Admitting: *Deleted

## 2019-08-23 DIAGNOSIS — R200 Anesthesia of skin: Secondary | ICD-10-CM

## 2019-08-23 DIAGNOSIS — I7300 Raynaud's syndrome without gangrene: Secondary | ICD-10-CM

## 2019-08-25 ENCOUNTER — Encounter: Payer: Self-pay | Admitting: Medical

## 2019-08-25 ENCOUNTER — Other Ambulatory Visit (HOSPITAL_COMMUNITY): Payer: Managed Care, Other (non HMO)

## 2019-08-25 ENCOUNTER — Encounter: Payer: Managed Care, Other (non HMO) | Admitting: Vascular Surgery

## 2019-09-03 ENCOUNTER — Other Ambulatory Visit: Payer: Self-pay | Admitting: Medical

## 2019-09-04 ENCOUNTER — Encounter: Payer: Self-pay | Admitting: Medical

## 2019-09-08 ENCOUNTER — Other Ambulatory Visit (HOSPITAL_COMMUNITY): Payer: Managed Care, Other (non HMO)

## 2019-09-08 ENCOUNTER — Encounter: Payer: Managed Care, Other (non HMO) | Admitting: Vascular Surgery

## 2019-09-22 ENCOUNTER — Ambulatory Visit (HOSPITAL_COMMUNITY): Payer: Managed Care, Other (non HMO)

## 2019-09-22 ENCOUNTER — Encounter: Payer: Managed Care, Other (non HMO) | Admitting: Vascular Surgery

## 2019-09-27 ENCOUNTER — Other Ambulatory Visit: Payer: Self-pay

## 2019-09-27 MED ORDER — AMLODIPINE BESYLATE 5 MG PO TABS: 5.0000 mg | ORAL_TABLET | Freq: Every day | ORAL | 1 refills | Status: DC

## 2019-10-02 ENCOUNTER — Ambulatory Visit: Payer: Managed Care, Other (non HMO) | Admitting: Podiatry

## 2019-10-06 ENCOUNTER — Ambulatory Visit: Payer: Managed Care, Other (non HMO) | Admitting: Podiatry

## 2019-10-06 ENCOUNTER — Other Ambulatory Visit: Payer: Self-pay

## 2019-10-06 ENCOUNTER — Encounter: Payer: Self-pay | Admitting: Podiatry

## 2019-10-06 VITALS — Temp 98.5°F

## 2019-10-06 DIAGNOSIS — B079 Viral wart, unspecified: Secondary | ICD-10-CM

## 2019-10-06 DIAGNOSIS — Q828 Other specified congenital malformations of skin: Secondary | ICD-10-CM

## 2019-10-08 NOTE — Progress Notes (Signed)
Subjective:   Patient ID: Devin Erickson, male   DOB: 49 y.o.   MRN: [id number]   HPI Patient presents stating he has a lesion on the outside of the right foot and it remains irritated and it feels like he is walking on a pebble but also feels like it has grown since we saw him last   ROS      Objective:  Physical Exam  Neurovascular status intact with a keratotic lesion plantar lateral aspect right foot that upon debridement shows pinpoint bleeding pain to lateral pressure but no other pathology associated with it     Assessment:  Problem  that this is a verruca plantaris but cannot rule out porokeratotic type lesion     Plan:  Reviewed both conditions I did sterile sharp debridement of the area I then exposed the tissue and applied chemical agent to create immune response along with sterile dressing and gave instructions for padding of the area.  Patient will be seen back to recheck may require more aggressive treatment depending on response with the possibility for osteotomy surgery

## 2020-03-24 ENCOUNTER — Other Ambulatory Visit: Payer: Self-pay | Admitting: Medical

## 2020-04-05 ENCOUNTER — Ambulatory Visit (INDEPENDENT_AMBULATORY_CARE_PROVIDER_SITE_OTHER): Payer: 59 | Admitting: Medical

## 2020-04-05 ENCOUNTER — Encounter: Payer: Self-pay | Admitting: Medical

## 2020-04-05 ENCOUNTER — Other Ambulatory Visit: Payer: Self-pay

## 2020-04-05 VITALS — BP 120/76 | HR 94 | Ht 75.0 in | Wt 279.0 lb

## 2020-04-05 DIAGNOSIS — M549 Dorsalgia, unspecified: Secondary | ICD-10-CM

## 2020-04-05 DIAGNOSIS — M62838 Other muscle spasm: Secondary | ICD-10-CM

## 2020-04-05 DIAGNOSIS — Z23 Encounter for immunization: Secondary | ICD-10-CM | POA: Diagnosis not present

## 2020-04-05 MED ORDER — METHOCARBAMOL 500 MG PO TABS: 500.0000 mg | ORAL_TABLET | Freq: Two times a day (BID) | ORAL | 0 refills | Status: DC | PRN

## 2020-04-05 NOTE — Patient Instructions (Addendum)
Recommendations:  Over the weekend, use heat such as hot towel, hot shower, etc  Alternate heat with cool therapy.  Consider ice water pack or cold towel 20 minutes on 20 minutes off  Use arm sling through the weekend to rest the right shoulder and arm - to avoid using those upper back muscle this weekend  Use over the counter Aleve, 2 tablets twice daily for the next week  Begin Robaxin muscle relaxer at bedtime or up to twice daily  Consider massage therapy on Monday or Tuesday  Consider follow up with chiropractor next week as planned, consider dry needling   Massage Therapy:  [facility] [address] [place] [address] [place], [place] [address] [phone] [email or web address]

## 2020-04-05 NOTE — Progress Notes (Signed)
Subjective:  Devin Erickson is a 49 y.o. male who presents for Chief Complaint  Patient presents with  . Back Pain    Upper back pain right side radiating down mid back right side.      Here for pain in right upper back, stiff, spasm.  Been having pain about a month.   initially went to chiropractor, but its not getting better.  Been using muscle rub.   Seems to get swollen in upper back. Pain wakening him up at night.  Chiropractor felt it was a pulled muscle.  Right handed.  No recent injury, trauma, fall.  At work he changed jobs.  Virtually no heavy lifting.  Not a lot of repetitive activity.   Works at [employer], Marine scientist.   He wonders if he injured himself with exercise.  Has been doing a little more reps prior to the pain a month ago.    Wants a flu shot today.   No other aggravating or relieving factors.    No other c/o.  The following portions of the patient's history were reviewed and updated as appropriate: allergies, current medications, past family history, past medical history, past social history, past surgical history and problem list.  ROS Otherwise as in subjective above  Objective: BP 120/76   Pulse 94   Ht 6\' 3"  (1.905 m)   Wt 279 lb (126.6 kg)   SpO2 97%   BMI 34.87 kg/m   General appearance: alert, no distress, well developed, well nourished Neck: supple, no lymphadenopathy, no thyromegaly, no masses, nontender Back: tender right upper rhomboids and subscapularis, swollen in same area, +spasm, pain with arm ROM but full ROM and no shoulder or arm tendnerss.    Pulses: 2+ radial pulses, 2+ pedal pulses, normal cap refill Ext: no edema Arms neurovascularly intact    Assessment: Encounter Diagnoses  Name Primary?  Upper back pain Yes  . Muscle spasm   . Need for influenza vaccination      Plan: Symptoms and exam suggest muscle spasm and shoulder girdle and upper back inflammation.    Discussed recommendations  below  Patient Instructions  Recommendations:  Over the weekend, use heat such as hot towel, hot shower, etc  Alternate heat with cool therapy.  Consider ice water pack or cold towel 20 minutes on 20 minutes off  Use arm sling through the weekend to rest the right shoulder and arm - to avoid using those upper back muscle this weekend  Use over the counter Aleve, 2 tablets twice daily for the next week  Begin Robaxin muscle relaxer at bedtime or up to twice daily  Consider massage therapy on Monday or Tuesday  Consider follow up with chiropractor next week as planned, consider dry needling   Massage Therapy:  [facility] [address] [place] [address] [place], [place] [phone] [email or web address]    Counseled on the influenza virus vaccine.  Vaccine information sheet given.  Influenza vaccine given after consent obtained.   Devin Erickson was seen today for back pain.  Diagnoses and all orders for this visit:  Upper back pain  Muscle spasm  Need for influenza vaccination  Other orders -     Flu Vaccine QUAD 6+ mos PF IM (Fluarix Quad PF) -     methocarbamol (ROBAXIN) 500 MG tablet; Take 1 tablet (500 mg total) by mouth 2 (two) times daily as needed for muscle spasms.    Follow up: 2 weeks

## 2020-05-08 ENCOUNTER — Encounter: Payer: Self-pay | Admitting: Medical

## 2020-05-08 ENCOUNTER — Other Ambulatory Visit: Payer: Self-pay

## 2020-05-08 ENCOUNTER — Ambulatory Visit: Payer: 59 | Admitting: Medical

## 2020-05-08 VITALS — BP 128/70 | HR 84 | Ht 75.0 in | Wt 283.4 lb

## 2020-05-08 DIAGNOSIS — M62830 Muscle spasm of back: Secondary | ICD-10-CM | POA: Insufficient documentation

## 2020-05-08 DIAGNOSIS — G8929 Other chronic pain: Secondary | ICD-10-CM | POA: Insufficient documentation

## 2020-05-08 DIAGNOSIS — M546 Pain in thoracic spine: Secondary | ICD-10-CM

## 2020-05-08 DIAGNOSIS — M549 Dorsalgia, unspecified: Secondary | ICD-10-CM | POA: Insufficient documentation

## 2020-05-08 MED ORDER — CYCLOBENZAPRINE HCL 10 MG PO TABS: 10.0000 mg | ORAL_TABLET | Freq: Every evening | ORAL | 0 refills | Status: DC | PRN

## 2020-05-08 MED ORDER — HYDROCODONE-ACETAMINOPHEN 5-325 MG PO TABS: 1.0000 | ORAL_TABLET | Freq: Every evening | ORAL | 0 refills | Status: DC | PRN

## 2020-05-08 NOTE — Progress Notes (Signed)
Subjective:  Devin Erickson is a 50 y.o. male who presents for Chief Complaint  Patient presents with  . Follow-up    Back pain       Here for follow-up on pain in right upper back.  I saw him about a month ago.  Since last visit having dull ache.  There has been some improvement, but not 100% back to normal.  Last visit was more of a pain, but now a dull ache.   Moving certain ways its uncomfortable.  No rash.  There is a concern about his posture.  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  No recent injury, trauma, fall.  At work he changed jobs.  Virtually no heavy lifting.  Not a lot of repetitive activity.   Works at [employer], Marine scientist.    No other aggravating or relieving factors.    No other c/o.  The following portions of the patient's history were reviewed and updated as appropriate: allergies, current medications, past family history, past medical history, past social history, past surgical history and problem list.  ROS Otherwise as in subjective above    Objective: BP 128/70   Pulse 84   Ht 6\' 3"  (1.905 m)   Wt 283 lb 6.4 oz (128.5 kg)   SpO2 99%   BMI 35.42 kg/m   General appearance: alert, no distress, well developed, well nourished Neck: supple, no lymphadenopathy, no thyromegaly, no masses, nontender Back: tender right upper rhomboids and subscapularis,+spasm, pain with arm ROM but full ROM and no shoulder or arm tendnerss.    Pulses: 2+ radial pulses, 2+ pedal pulses, normal cap refill Ext: no edema Arms neurovascularly intact    Assessment: Encounter Diagnoses  Name Primary?  . Chronic right-sided thoracic back pain Yes  . Upper back pain   . Back spasm      Plan: Symptoms and exam suggest muscle spasm and shoulder girdle and upper  back inflammation.  He got no benefit on Robaxin.  Begin Flexeril below for the next few days.  If needed can use of Norco.  Discussed risk and benefits of both medicines, proper use of medicine and the possible sedation.  Do not take the medicines simultaneously.  Continue heat, stretching.  He just purchased a resistance band and I gave him some exercises to do.  He will call back within 3 days if no improvement at all.  If not improving x-ray below and referral to physical therapy would be the neck step since he has not been benefiting from massage and chiropractor therapy to get full resolution  Devin Erickson was seen today for follow-up.  Diagnoses and all orders for this visit:  Chronic right-sided thoracic back pain -     DG Thoracic Spine W/Swimmers; Future  Upper back pain -     DG Thoracic Spine W/Swimmers; Future  Back spasm -     DG Thoracic Spine W/Swimmers; Future  Other orders -     cyclobenzaprine (FLEXERIL) 10 MG tablet; Take 1 tablet (10 mg total) by mouth at bedtime as needed. -     HYDROcodone-acetaminophen (NORCO) 5-325 MG tablet; Take 1 tablet by mouth at bedtime as needed.    Follow up: Call back in 3 days

## 2020-05-21 ENCOUNTER — Ambulatory Visit
Admission: RE | Admit: 2020-05-21 | Discharge: 2020-05-21 | Disposition: A | Discharge status: Home / Self Care | Payer: Managed Care, Other (non HMO) | Source: Ambulatory Visit | Attending: Medical | Admitting: Medical

## 2020-05-21 DIAGNOSIS — M549 Dorsalgia, unspecified: Secondary | ICD-10-CM

## 2020-05-21 DIAGNOSIS — M62830 Muscle spasm of back: Secondary | ICD-10-CM

## 2020-05-21 DIAGNOSIS — G8929 Other chronic pain: Secondary | ICD-10-CM

## 2020-06-25 ENCOUNTER — Other Ambulatory Visit: Payer: Self-pay | Admitting: Medical

## 2020-08-06 ENCOUNTER — Other Ambulatory Visit: Payer: Self-pay

## 2020-08-07 ENCOUNTER — Telehealth: Payer: Self-pay | Admitting: Medical

## 2020-08-07 NOTE — Telephone Encounter (Signed)
Pt called and is requesting a refill on his amlodipine pt only has 1 left please send to [id number] - [place], [place] - [address] [facility] [address]

## 2020-08-08 ENCOUNTER — Other Ambulatory Visit: Payer: Self-pay

## 2020-08-08 MED ORDER — AMLODIPINE BESYLATE 5 MG PO TABS: 1.0000 | ORAL_TABLET | Freq: Every day | ORAL | 0 refills | Status: DC

## 2020-08-23 ENCOUNTER — Encounter: Payer: Self-pay | Admitting: Medical

## 2020-08-23 ENCOUNTER — Ambulatory Visit (INDEPENDENT_AMBULATORY_CARE_PROVIDER_SITE_OTHER): Payer: 59 | Admitting: Medical

## 2020-08-23 ENCOUNTER — Other Ambulatory Visit: Payer: Self-pay

## 2020-08-23 VITALS — BP 130/84 | HR 82 | Ht 75.0 in | Wt 278.2 lb

## 2020-08-23 DIAGNOSIS — R29898 Other symptoms and signs involving the musculoskeletal system: Secondary | ICD-10-CM

## 2020-08-23 DIAGNOSIS — M62830 Muscle spasm of back: Secondary | ICD-10-CM

## 2020-08-23 DIAGNOSIS — Z23 Encounter for immunization: Secondary | ICD-10-CM | POA: Diagnosis not present

## 2020-08-23 DIAGNOSIS — M436 Torticollis: Secondary | ICD-10-CM | POA: Diagnosis not present

## 2020-08-23 MED ORDER — CYCLOBENZAPRINE HCL 10 MG PO TABS: 10.0000 mg | ORAL_TABLET | Freq: Every evening | ORAL | 2 refills | Status: DC | PRN

## 2020-08-23 NOTE — Progress Notes (Signed)
Subjective:  Devin Erickson is a 50 y.o. male who presents for Chief Complaint  Patient presents with  . Shoulder Pain    Pt present for right shoulder and neck pain that started yesterday afternoon      Here for right shoulder and neck pain, more intense than when I saw him in 04/2020 for right upper back pain.   In the last month or so has been doing ok, but seeing chiropractor for therapy.   Yesterday did some back exercises and wondered if this aggravated things.  Was doing rows, lunges, other shoulder presses and resistance exercise.  No pain down right arm. Mainly in neck and upper back, can't turn neck.   No numbness or tingling down right arm.  The acute pain and difficulty with neck ROM began yesterday.  Been using heat, massage.   Since 04/2020, goes 1-2 times per month to chiropractor.  Doesn't have any flexeril left over from last visit .    Right handed  No other aggravating or relieving factors.    No other c/o.  The following portions of the patient's history were reviewed and updated as appropriate: allergies, current medications, past family history, past medical history, past social history, past surgical history and problem list.  ROS Otherwise as in subjective above    Objective: BP 130/84   Pulse 82   Ht 6\' 3"  (1.905 m)   Wt 278 lb 3.2 oz (126.2 kg)   SpO2 97%   BMI 34.77 kg/m   General appearance: alert, no distress, well developed, well nourished Neck: tender right lateral neck , ext quite reduced, lateral rotation and flexion reduced bilat, worse on right, flexion ok. No mass, no lymphadenopathy, no thyromegaly Back: tender,  full ROM and no shoulder or arm tendnerss.    Pulses: 2+ radial pulses, 2+ pedal pulses, normal cap refill Ext: no edema Arms neurovascularly intact    Assessment: Encounter Diagnoses  Name Primary?  . Torticollis Yes  . Decreased ROM of neck   . Back spasm   . Need for COVID-19 vaccine      Plan: Symptoms and exam  suggest muscle spasm/torticollis.  Begin Flexeril below for the next few days, prn.  Discussed risk and benefits of both medicines, proper use of medicine and the possible sedation.  Can also use OTC Aleve the next few days.  Continue heat, stretching, advised gentle stretching.  If not resolved within 10 days then recheck.  Counseled on Covid virus vaccine.  Vaccine information sheet given.  Covid vaccine given after consent obtained.   Devin Erickson was seen today for shoulder pain.  Diagnoses and all orders for this visit:  Torticollis  Decreased ROM of neck  Back spasm  Need for COVID-19 vaccine -     PFIZER Comirnaty(GRAY TOP)COVID-19 Vaccine  Other orders -     cyclobenzaprine (FLEXERIL) 10 MG tablet; Take 1 tablet (10 mg total) by mouth at bedtime as needed.   Follow up: prn

## 2021-01-24 ENCOUNTER — Other Ambulatory Visit: Payer: Self-pay | Admitting: Medical

## 2021-02-05 ENCOUNTER — Other Ambulatory Visit: Payer: Self-pay

## 2021-02-05 ENCOUNTER — Encounter: Payer: Self-pay | Admitting: Medical

## 2021-02-05 ENCOUNTER — Ambulatory Visit (INDEPENDENT_AMBULATORY_CARE_PROVIDER_SITE_OTHER): Payer: 59 | Admitting: Medical

## 2021-02-05 VITALS — BP 130/88 | HR 83 | Ht 75.0 in | Wt 266.2 lb

## 2021-02-05 DIAGNOSIS — R7989 Other specified abnormal findings of blood chemistry: Secondary | ICD-10-CM

## 2021-02-05 DIAGNOSIS — Z125 Encounter for screening for malignant neoplasm of prostate: Secondary | ICD-10-CM | POA: Diagnosis not present

## 2021-02-05 DIAGNOSIS — G479 Sleep disorder, unspecified: Secondary | ICD-10-CM

## 2021-02-05 DIAGNOSIS — N529 Male erectile dysfunction, unspecified: Secondary | ICD-10-CM

## 2021-02-05 DIAGNOSIS — L630 Alopecia (capitis) totalis: Secondary | ICD-10-CM

## 2021-02-05 DIAGNOSIS — Z7185 Encounter for immunization safety counseling: Secondary | ICD-10-CM

## 2021-02-05 DIAGNOSIS — J309 Allergic rhinitis, unspecified: Secondary | ICD-10-CM

## 2021-02-05 DIAGNOSIS — R5383 Other fatigue: Secondary | ICD-10-CM | POA: Diagnosis not present

## 2021-02-05 DIAGNOSIS — Z0000 Encounter for general adult medical examination without abnormal findings: Secondary | ICD-10-CM | POA: Diagnosis not present

## 2021-02-05 DIAGNOSIS — Z136 Encounter for screening for cardiovascular disorders: Secondary | ICD-10-CM | POA: Diagnosis not present

## 2021-02-05 DIAGNOSIS — G8929 Other chronic pain: Secondary | ICD-10-CM

## 2021-02-05 DIAGNOSIS — E785 Hyperlipidemia, unspecified: Secondary | ICD-10-CM

## 2021-02-05 DIAGNOSIS — M546 Pain in thoracic spine: Secondary | ICD-10-CM

## 2021-02-05 NOTE — Progress Notes (Signed)
Subjective:   HPI  Devin Erickson is a 50 y.o. male who presents for Chief Complaint  Patient presents with   cpe    Cpe. Had protein water to drink nothing to eat. 5-6 hours of sleep.     Patient Care Team: Liset Mcmonigle, Cleda Mccreedy as PCP - General (Family Medicine) Sees dentist Sees eye doctor  Concerns: Here for physical  Concerns include fatigue.  Would like testosterone checked.  some difficulty getting erections, but no chest pain, no palpitations, no edema.  sleep not great, maybe 4-6 hours per night.  Has trouble getting to sleep and staying asleep.  Has tried melatonin  Has history of sleep apnea, on CPAP in the past but stopped this after losing weight  Reviewed their medical, surgical, family, social, medication, and allergy history and updated chart as appropriate.  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   History of sleep apnea 04/2012   per overnight screen/oximetry    Obesity    Thalassemia minor    Wears glasses    Wears glasses     Past Surgical History:  Procedure Laterality Date   COLONOSCOPY  2012   [facility], normal per pt   TONSILLECTOMY AND ADENOIDECTOMY     age 33yo    Family History  Problem Relation Age of Onset   Cancer Mother 75       colon   Hypertension Mother    Dementia Father    Cancer Father        pancreas   Heart disease Neg Hx    Lung disease Neg Hx      Current Outpatient Medications:    Multiple Vitamin (MULTIVITAMIN) tablet, Take 1 tablet by mouth daily., Disp: , Rfl:    albuterol (PROAIR HFA) 108 (90 Base) MCG/ACT inhaler, Inhale 2 puffs into the lungs every 6 (six) hours as needed for wheezing or shortness of breath. (Patient not taking: Reported on 02/05/2021), Disp: 18 g, Rfl: 1   amLODipine (NORVASC) 5 MG tablet, Take 1 tablet (5 mg  total) by mouth daily. (Patient not taking: Reported on 02/05/2021), Disp: 30 tablet, Rfl: 0   budesonide-formoterol (SYMBICORT) 160-4.5 MCG/ACT inhaler, Inhale 2 puffs into the lungs 2 (two) times daily. (Patient not taking: Reported on 02/05/2021), Disp: 1 Inhaler, Rfl: 11   rosuvastatin (CRESTOR) 20 MG tablet, TAKE 1 TABLET BY MOUTH EVERYDAY AT BEDTIME (Patient not taking: Reported on 02/05/2021), Disp: 90 tablet, Rfl: 0  No Known Allergies   Review of Systems Constitutional: -fever, -chills, -sweats, -unexpected weight change, -decreased appetite, +fatigue Allergy: -sneezing, -itching, -congestion Dermatology: -changing moles, --rash, -lumps ENT: -runny nose, -ear pain, -sore throat, -hoarseness, -sinus pain, -teeth pain, - ringing in ears, -hearing loss, -nosebleeds Cardiology: -chest pain, -palpitations, -swelling, -difficulty breathing when lying flat, -waking up short of breath Respiratory: -cough, -shortness of breath, -difficulty breathing with exercise or exertion, -wheezing, -coughing up blood Gastroenterology: -abdominal pain, -nausea, -vomiting, -diarrhea, -constipation, -blood in stool, -changes in bowel movement, -difficulty swallowing or eating Hematology: -bleeding, -bruising  Musculoskeletal: -joint aches, -muscle aches, -joint swelling, -back pain, -neck pain, -cramping, -changes in gait Ophthalmology: denies vision changes, eye redness, itching, discharge Urology: -burning with urination, -difficulty urinating, -blood in urine, -urinary frequency, -urgency, -incontinence Neurology: -headache, -weakness, -tingling, -  numbness, -memory loss, -falls, -dizziness Psychology: -depressed mood, -agitation, +sleep problems Male GU: no testicular mass, pain, no lymph nodes swollen, no swelling, no rash.  Depression screen [facility] 2/9 02/05/2021 04/05/2020 02/16/2019 07/22/2016  Decreased Interest 0 0 0 0  Down, Depressed, Hopeless 0 0 0 0  PHQ - 2 Score 0 0 0 0        Objective:   BP 130/88   Pulse 83   Ht 6\' 3"  (1.905 m)   Wt 266 lb 3.2 oz (120.7 kg)   BMI 33.27 kg/m   General appearance: alert, no distress, WD/WN, African American male Skin: Alopecia totalis, no new worrisome lesions HEENT: normocephalic, conjunctiva/corneas normal, sclerae anicteric, PERRLA, EOMi Neck: supple, no lymphadenopathy, no thyromegaly, no masses, normal ROM, no bruits Chest: non tender, normal shape and expansion Heart: RRR, normal S1, S2, no murmurs Lungs: CTA bilaterally, no wheezes, rhonchi, or rales Abdomen: +bs, soft, non tender, non distended, no masses, no hepatomegaly, no splenomegaly, no bruits Back: non tender, normal ROM, no scoliosis Musculoskeletal: upper extremities non tender, no obvious deformity, normal ROM throughout, lower extremities non tender, no obvious deformity, normal ROM throughout Extremities: no edema, no cyanosis, no clubbing Pulses: 2+ symmetric, upper and lower extremities, normal cap refill Neurological: alert, oriented x 3, CN2-12 intact, strength normal upper extremities and lower extremities, sensation normal throughout, DTRs 2+ throughout, no cerebellar signs, gait normal Psychiatric: normal affect, behavior normal, pleasant  GU/rectal - deferred   Adult ECG Report  Indication: screening  Rate: 74 bpm  Rhythm: normal sinus rhythm and sinus arrhythmia  QRS Axis: -15 degrees  PR Interval:  QRS Duration: 102ms  QTc: 93m  Conduction Disturbances: none  Other Abnormalities: Q in V3 and T wave inversion V2  Patient's cardiac risk factors are: dyslipidemia, male gender, and obesity (BMI >= 30 kg/m2).  EKG comparison: 2016, V2 inversion is new  Narrative Interpretation: abnormal EKG    Assessment and Plan :   Encounter Diagnoses  Name Primary?   Encounter for health maintenance examination in adult Yes   Screening for heart disease    Screening for prostate cancer    Fatigue, unspecified type    Erectile dysfunction, unspecified  erectile dysfunction type    Vaccine counseling    Allergic rhinitis, unspecified seasonality, unspecified trigger    Hyperlipidemia, unspecified hyperlipidemia type    Elevated liver function tests    Alopecia areata totalis    Chronic right-sided thoracic back pain    Sleep disturbance     This visit was a preventative care visit, also known as wellness visit or routine physical.   Topics typically include healthy lifestyle, diet, exercise, preventative care, vaccinations, sick and well care, proper use of emergency dept and after hours care, as well as other concerns.     Recommendations: Continue to return yearly for your annual wellness and preventative care visits.  This gives 2017 a chance to discuss healthy lifestyle, exercise, vaccinations, review your chart record, and perform screenings where appropriate.  I recommend you see your eye doctor yearly for routine vision care.  I recommend you see your dentist yearly for routine dental care including hygiene visits twice yearly.   Vaccination recommendations were reviewed Immunization History  Administered Date(s) Administered   Influenza Split 02/07/2009, 03/04/2010   Influenza,inj,Quad PF,6+ Mos 02/15/2012, 03/10/2013, 06/14/2014, 02/01/2015, 02/08/2017, 03/10/2018, 02/16/2019, 04/05/2020, 01/06/2021   Influenza-Unspecified 05/15/2016   Moderna Sars-Covid-2 Vaccination 07/13/2019, 08/10/2019   PFIZER Comirnaty(Gray Top)Covid-19 Tri-Sucrose Vaccine 08/23/2020   PFIZER(Purple  Top)SARS-COV-2 Vaccination 03/02/2020   Pfizer Covid-19 Vaccine Bivalent Booster 49yrs & up 01/06/2021   Tdap 02/15/2012, 08/10/2012   Zoster Recombinat (Shingrix) 06/08/2020, 08/07/2020      Screening for cancer: Colon cancer screening: I reviewed your colonoscopy on file that is up to date from 06/2019  We discussed PSA, prostate exam, and prostate cancer screening risks/benefits.     Skin cancer screening: Check your skin regularly for new  changes, growing lesions, or other lesions of concern Come in for evaluation if you have skin lesions of concern.  Lung cancer screening: If you have a greater than 20 pack year history of tobacco use, then you may qualify for lung cancer screening with a chest CT scan.   Please call your insurance company to inquire about coverage for this test.  We currently don't have screenings for other cancers besides breast, cervical, colon, and lung cancers.  If you have a strong family history of cancer or have other cancer screening concerns, please let me know.    Bone health: Get at least 150 minutes of aerobic exercise weekly Get weight bearing exercise at least once weekly Bone density test:  A bone density test is an imaging test that uses a type of X-ray to measure the amount of calcium and other minerals in your bones. The test may be used to diagnose or screen you for a condition that causes weak or thin bones (osteoporosis), predict your risk for a broken bone (fracture), or determine how well your osteoporosis treatment is working. The bone density test is recommended for females 65 and older, or females or males <65 if certain risk factors such as thyroid disease, long term use of steroids such as for asthma or rheumatological issues, vitamin D deficiency, estrogen deficiency, family history of osteoporosis, self or family history of fragility fracture in first degree relative.    Heart health: Get at least 150 minutes of aerobic exercise weekly Limit alcohol It is important to maintain a healthy blood pressure and healthy cholesterol numbers  Heart disease screening: Screening for heart disease includes screening for blood pressure, fasting lipids, glucose/diabetes screening, BMI height to weight ratio, reviewed of smoking status, physical activity, and diet.    Goals include blood pressure 120/80 or less, maintaining a healthy lipid/cholesterol profile, preventing diabetes or keeping  diabetes numbers under good control, not smoking or using tobacco products, exercising most days per week or at least 150 minutes per week of exercise, and eating healthy variety of fruits and vegetables, healthy oils, and avoiding unhealthy food choices like fried food, fast food, high sugar and high cholesterol foods.    Other tests may possibly include EKG test, CT coronary calcium score, echocardiogram, exercise treadmill stress test.    Medical care options: I recommend you continue to seek care here first for routine care.  We try really hard to have available appointments Monday through Friday daytime hours for sick visits, acute visits, and physicals.  Urgent care should be used for after hours and weekends for significant issues that cannot wait till the next day.  The emergency department should be used for significant potentially life-threatening emergencies.  The emergency department is expensive, can often have long wait times for less significant concerns, so try to utilize primary care, urgent care, or telemedicine when possible to avoid unnecessary trips to the emergency department.  Virtual visits and telemedicine have been introduced since the pandemic started in 2020, and can be convenient ways to receive medical care.  We offer virtual appointments as well to assist you in a variety of options to seek medical care.    Separate significant issues discussed:  Fatigue - multifactorial . Updated labs today, consider updated sleep study  ED - consider medications.   Sleep disturbance - discussed sleep hygiene, strategies to lose weight  Prior elevated LFTs, updated labs today  Hyperlipidemia - continue medicaiton, updated labs today  Abnormal EKG - pending labs, likely baseline cardiology consult    Estell was seen today for cpe.  Diagnoses and all orders for this visit:  Encounter for health maintenance examination in adult -     PSA -     EKG 12-Lead -      Comprehensive metabolic panel -     CBC with Differential/Platelet -     Lipid panel -     Testosterone -     Vitamin B12  Screening for heart disease  Screening for prostate cancer -     PSA  Fatigue, unspecified type -     EKG 12-Lead -     CBC with Differential/Platelet -     Testosterone -     Vitamin B12  Erectile dysfunction, unspecified erectile dysfunction type  Vaccine counseling  Allergic rhinitis, unspecified seasonality, unspecified trigger  Hyperlipidemia, unspecified hyperlipidemia type -     Lipid panel  Elevated liver function tests -     Comprehensive metabolic panel  Alopecia areata totalis  Chronic right-sided thoracic back pain  Sleep disturbance   Follow-up pending labs, yearly for physical

## 2021-02-06 ENCOUNTER — Other Ambulatory Visit: Payer: Self-pay | Admitting: Medical

## 2021-02-06 DIAGNOSIS — R7989 Other specified abnormal findings of blood chemistry: Secondary | ICD-10-CM

## 2021-02-06 DIAGNOSIS — R9431 Abnormal electrocardiogram [ECG] [EKG]: Secondary | ICD-10-CM

## 2021-02-06 LAB — CBC WITH DIFFERENTIAL/PLATELET
Basophils Absolute: 0 10*3/uL (ref 0.0–0.2)
Basos: 1 %
EOS (ABSOLUTE): 0.4 10*3/uL (ref 0.0–0.4)
Eos: 7 %
Hematocrit: 38.8 % (ref 37.5–51.0)
Hemoglobin: 13 g/dL (ref 13.0–17.7)
Immature Grans (Abs): 0 10*3/uL (ref 0.0–0.1)
Immature Granulocytes: 0 %
Lymphocytes Absolute: 1.2 10*3/uL (ref 0.7–3.1)
Lymphs: 24 %
MCH: 24 pg — ABNORMAL LOW (ref 26.6–33.0)
MCHC: 33.5 g/dL (ref 31.5–35.7)
MCV: 72 fL — ABNORMAL LOW (ref 79–97)
Monocytes Absolute: 0.7 10*3/uL (ref 0.1–0.9)
Monocytes: 13 %
Neutrophils Absolute: 2.8 10*3/uL (ref 1.4–7.0)
Neutrophils: 55 %
Platelets: 224 10*3/uL (ref 150–450)
RBC: 5.41 x10E6/uL (ref 4.14–5.80)
RDW: 17 % — ABNORMAL HIGH (ref 11.6–15.4)
WBC: 5.1 10*3/uL (ref 3.4–10.8)

## 2021-02-06 LAB — COMPREHENSIVE METABOLIC PANEL
ALT: 32 IU/L (ref 0–44)
AST: 45 IU/L — ABNORMAL HIGH (ref 0–40)
Albumin/Globulin Ratio: 1.6 (ref 1.2–2.2)
Albumin: 4.4 g/dL (ref 3.8–4.9)
Alkaline Phosphatase: 49 IU/L (ref 44–121)
BUN/Creatinine Ratio: 23 — ABNORMAL HIGH (ref 9–20)
BUN: 23 mg/dL (ref 6–24)
Bilirubin Total: 0.6 mg/dL (ref 0.0–1.2)
CO2: 20 mmol/L (ref 20–29)
Calcium: 9.4 mg/dL (ref 8.7–10.2)
Chloride: 103 mmol/L (ref 96–106)
Creatinine, Ser: 0.98 mg/dL (ref 0.76–1.27)
Globulin, Total: 2.7 g/dL (ref 1.5–4.5)
Glucose: 88 mg/dL (ref 70–99)
Potassium: 4 mmol/L (ref 3.5–5.2)
Sodium: 140 mmol/L (ref 134–144)
Total Protein: 7.1 g/dL (ref 6.0–8.5)
eGFR: 93 mL/min/{1.73_m2} (ref >59)

## 2021-02-06 LAB — LIPID PANEL
Chol/HDL Ratio: 3.9 ratio (ref 0.0–5.0)
Cholesterol, Total: 202 mg/dL — ABNORMAL HIGH (ref 100–199)
HDL: 52 mg/dL (ref >39)
LDL Chol Calc (NIH): 134 mg/dL — ABNORMAL HIGH (ref 0–99)
Triglycerides: 88 mg/dL (ref 0–149)
VLDL Cholesterol Cal: 16 mg/dL (ref 5–40)

## 2021-02-06 LAB — PSA: Prostate Specific Ag, Serum: 1.2 ng/mL (ref 0.0–4.0)

## 2021-02-06 LAB — VITAMIN B12: Vitamin B-12: 1175 pg/mL (ref 232–1245)

## 2021-02-06 LAB — TESTOSTERONE: Testosterone: 359 ng/dL (ref 264–916)

## 2021-02-06 MED ORDER — ROSUVASTATIN CALCIUM 20 MG PO TABS: ORAL_TABLET | ORAL | 3 refills | Status: DC

## 2021-03-03 ENCOUNTER — Ambulatory Visit
Admission: RE | Admit: 2021-03-03 | Discharge: 2021-03-03 | Disposition: A | Discharge status: Home / Self Care | Payer: 59 | Source: Ambulatory Visit | Attending: Medical | Admitting: Medical

## 2021-03-03 DIAGNOSIS — R7989 Other specified abnormal findings of blood chemistry: Secondary | ICD-10-CM

## 2021-03-24 ENCOUNTER — Ambulatory Visit: Payer: 59 | Admitting: Medical

## 2021-03-24 ENCOUNTER — Other Ambulatory Visit: Payer: Self-pay

## 2021-03-24 VITALS — BP 120/78 | HR 69 | Temp 98.1°F | Wt 268.8 lb

## 2021-03-24 DIAGNOSIS — R040 Epistaxis: Secondary | ICD-10-CM

## 2021-03-24 NOTE — Progress Notes (Addendum)
:    Devin Erickson is a 50 y.o. male who presents for Chief Complaint  Patient presents with   nose bleeds    Nose bleeds for a couple months. Not currently active. Very sensitive. Did see ENT 2 years ago for this     Here for daily nosebleeds x last few months.  He has had problems with nosebleeds before.  He saw ENT 2 years ago for similar but by the time he got there the bleeding had stopped and they did not do anything that day.  He also felt like the experience was not the best with that consult.  He has no other bleeding or bruising.  Otherwise in usual state of health.  No other aggravating or relieving factors.    No other c/o.  The following portions of the patient's history were reviewed and updated as appropriate: allergies, current medications, past family history, past medical history, past social history, past surgical history and problem list.  ROS Otherwise as in subjective above  Objective: BP 120/78   Pulse 69   Temp 98.1 F (36.7 C)   Wt 268 lb 12.8 oz (121.9 kg)   BMI 33.60 kg/m   General appearance: alert, no distress, well developed, well nourished HEENT: normocephalic, sclerae anicteric, conjunctiva pink and moist, TMs pearly, nares patent, there is a friable area along the distal nasal septum superior and inferior portions of the nare just inside the nose on the right, there is also some dry crusted blood inferiorly just inside the nare on the right.  Left nare is normal-appearing.  No other discharge, pharynx normal Skin: No obvious bruising   Assessment: Encounter Diagnosis  Name Primary?   Epistaxis Yes     Plan: We discussed this.  I have seen him in the past 2 years on and off for recurrent nosebleeds.  We have evaluated him for bleeding disorder in the past and found no obvious bleeding disorder.  Procedure: I used some topical lidocaine without epinephrine locally to help anesthetize the inside of the right nare and septum, use silver nitrate  sticks to cauterize a few friable areas , patient tolerated procedure well  Given the fact that he has daily nosebleeds weeks to a month or more we will go ahead and refer back to ear nose and throat for other evaluation and management.  He will continue to use Vaseline or other ointment such as triple antibiotic at bedtime for militarization and to help prevent nosebleeds.  Continue good water intake  Devin Erickson was seen today for nose bleeds.  Diagnoses and all orders for this visit:  Epistaxis -     Ambulatory referral to ENT -     Epistaxis management   Follow up: pending referral

## 2021-03-28 ENCOUNTER — Ambulatory Visit: Payer: Managed Care, Other (non HMO) | Admitting: Cardiovascular Disease

## 2021-03-29 NOTE — Progress Notes (Signed)
Cardiology Office Note   Date:  03/31/2021   ID:  Sal Spratley, DOB [birth year], MRN [id number]  PCP:  Jac Canavan, PA-C  Cardiologist:   Jazel Nimmons [place], MD   Chief Complaint  Patient presents with   Abnormal ECG      History of Present Illness: Devin Erickson is a 50 y.o. male who is seen at the request of Crosby Oyster PA-C for evaluation of abnormal Ecg. He has a history of obesity with OSA. He was seen remotely by Dr Anne Fu and apparently had a normal stress Echo in 2012. He states he works out 4 days a week with cardio and weight lifting. He denies any symptoms of chest pain, dyspnea or palpitations. No family history of early CAD. BP has been normal.  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, normal per pt   TONSILLECTOMY     TONSILLECTOMY AND ADENOIDECTOMY     age 79yo     Current Outpatient Medications  Medication Sig Dispense Refill   Multiple Vitamin (MULTIVITAMIN) tablet Take 1 tablet by mouth daily.     rosuvastatin (CRESTOR) 20 MG tablet TAKE 1 TABLET BY MOUTH EVERYDAY AT BEDTIME 90 tablet 3   No current facility-administered medications for this visit.    Allergies:   Patient has no known allergies.    Social History:  The patient  reports that he has never smoked. He has never used smokeless tobacco. He reports that he does not drink alcohol and does not use drugs.   Family History:  The patient's family history includes Cancer in his father; Cancer (age of onset: 78) in his mother; Dementia in his father; Hypertension in his mother.    ROS:   Please see the history of present illness.   Otherwise, review of systems are positive for none.   All other systems are reviewed and negative.    PHYSICAL EXAM: VS:  BP 134/76   Pulse 70   Ht 6\' 3"  (1.905 m)   Wt 270 lb 12.8 oz (122.8 kg)   SpO2 99%   BMI 33.85 kg/m  , BMI Body mass index is 33.85 kg/m. GEN: Well nourished, well developed, in no acute distress HEENT: normal Neck: no JVD, carotid bruits, or masses Cardiac: RRR; no murmurs, rubs, or gallops,no edema  Respiratory:  clear to auscultation bilaterally, normal work of breathing GI: soft, nontender, nondistended, + BS MS: no deformity or atrophy Skin: warm and dry, no rash Neuro:  Strength and sensation are intact Psych: euthymic mood, full affect   EKG:  EKG is ordered today. The ekg ordered today demonstrates NSR rate 70. Poor R wave progression V1-3. I have personally reviewed and interpreted this study.    Recent Labs: 02/05/2021: ALT 32; BUN 23; Creatinine, Ser 0.98; Hemoglobin 13.0; Platelets 224; Potassium 4.0; Sodium 140    Lipid Panel    Component Value Date/Time   CHOL 202 (H) 02/05/2021 1552   TRIG 88 02/05/2021 1552  HDL 52 02/05/2021 1552   CHOLHDL 3.9 02/05/2021 1552   CHOLHDL 3.7 07/22/2016 1443   VLDL 19 07/22/2016 1443   LDLCALC 134 (H) 02/05/2021 1552      Wt Readings from Last 3 Encounters:  03/31/21 270 lb 12.8 oz (122.8 kg)  03/24/21 268 lb 12.8 oz (121.9 kg)  02/05/21 266 lb 3.2 oz (120.7 kg)      Other studies Reviewed: Additional studies/ records that were reviewed today include: see above. Review of the above records demonstrates: as noted   ASSESSMENT AND PLAN:  1.  Abnormal Ecg. I have reviewed serial tracings from 2012, 2014, 2016 and now. I see very little change over time. There is some minor variation of V2 which is likely due to lead placement. He had a negative stress Echo in the past. No active cardiac symptoms. I think this is just a normal Ecg for him. I don't  recommend any further cardiac work up. Follow up PRN. 2. Hypercholesterolemia. On statin. Per PCP   Current medicines are reviewed at length with the patient today.  The patient does not have concerns regarding medicines.  The following changes have been made:  no change  Labs/ tests ordered today include:  No orders of the defined types were placed in this encounter.        Disposition:   FU PRN  Signed, Alli Jasmer [place], MD  03/31/2021 2:16 PM    [facility] [address] [place], [place], [address] Phone [phone], Fax [phone]

## 2021-03-31 ENCOUNTER — Ambulatory Visit: Payer: Managed Care, Other (non HMO) | Admitting: Cardiology

## 2021-03-31 ENCOUNTER — Encounter: Payer: Self-pay | Admitting: Cardiology

## 2021-03-31 ENCOUNTER — Other Ambulatory Visit: Payer: Self-pay

## 2021-03-31 VITALS — BP 134/76 | HR 70 | Ht 75.0 in | Wt 270.8 lb

## 2021-03-31 DIAGNOSIS — E7800 Pure hypercholesterolemia, unspecified: Secondary | ICD-10-CM

## 2021-03-31 DIAGNOSIS — R9431 Abnormal electrocardiogram [ECG] [EKG]: Secondary | ICD-10-CM

## 2021-04-09 NOTE — Addendum Note (Signed)
Addended by: Jac Canavan on: 04/09/2021 06:50 PM   Modules accepted: Level of Service

## 2021-05-05 ENCOUNTER — Ambulatory Visit (INDEPENDENT_AMBULATORY_CARE_PROVIDER_SITE_OTHER): Payer: 59 | Admitting: Podiatry

## 2021-05-05 ENCOUNTER — Other Ambulatory Visit: Payer: Self-pay

## 2021-05-05 ENCOUNTER — Encounter: Payer: Self-pay | Admitting: Podiatry

## 2021-05-05 DIAGNOSIS — M778 Other enthesopathies, not elsewhere classified: Secondary | ICD-10-CM

## 2021-05-05 MED ORDER — TRIAMCINOLONE ACETONIDE 10 MG/ML IJ SUSP
10.0000 mg | Freq: Once | INTRAMUSCULAR | Status: AC
  Administered 2021-05-05: 10 mg

## 2021-05-07 NOTE — Progress Notes (Signed)
Subjective:   Patient ID: Devin Erickson, male   DOB: 51 y.o.   MRN: [id number]   HPI Patient presents stating he has an area of inflammation on the bottom of his right foot and has a lesion associated with it.  States it is localized and it is painful when pressed.  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

## 2021-05-20 ENCOUNTER — Ambulatory Visit: Payer: 59 | Admitting: Medical

## 2021-05-20 ENCOUNTER — Other Ambulatory Visit: Payer: Self-pay

## 2021-05-20 VITALS — BP 120/80 | HR 56 | Temp 98.0°F | Wt 269.8 lb

## 2021-05-20 DIAGNOSIS — M549 Dorsalgia, unspecified: Secondary | ICD-10-CM

## 2021-05-20 DIAGNOSIS — R29898 Other symptoms and signs involving the musculoskeletal system: Secondary | ICD-10-CM

## 2021-05-20 DIAGNOSIS — M25512 Pain in left shoulder: Secondary | ICD-10-CM

## 2021-05-20 NOTE — Patient Instructions (Addendum)
Shoulder pain, neck decreased range of motion  Recommendations This week use ice water therapy for 3 days, then alternate ice and heat therapy Use Aleve up to 2 tablets twice daily this week Rest, consider arm sling After this week if you are feeling much improved, then work on rehab   Neck Use range of motion exercises daily Work on improving your neck range of motion 2018 xray of neck showed some arthritis   Shoulder girdle After this week of rest and reducing inflammation, then begin exercises we discussed Use the 5 shoulder rotator cuff exercises 3 days per week.  Complete each exercise with 2 sets of 15 repetitions After 2-3 weeks of this, if much improved, then restart your other routine weight lifting gradually

## 2021-05-20 NOTE — Progress Notes (Signed)
Subjective:  Devin Erickson is a 51 y.o. male who presents for Chief Complaint  Patient presents with   pulled muscle    Pulled muscle x a couple weeks ago. Pain in shoulder going down left side of back     Here for pain.  Gets worse in afternoon and evening.   He notes pains in left upper back, left shoulder, left arm, left chest wall, shoulder girdle.  No fall, but may have injured himself weight lifting.  Pain started day after lifting heavy.  Started having pains the day after bench press exercise.  Hasn't stopped exercising but has lightened up on weights.   No left arm weakness, numbness, but some tingling.  No neck pain. But wife noted he I  s not turning neck completely to left.   Using aleve.  No other aggravating or relieving factors.   No other c/o.  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  ROS Otherwise as in subjective above  Objective: BP 120/80    Pulse (!) 56    Temp 98 F (36.7 C)    Wt 269 lb 12.8 oz (122.4 kg)    BMI 33.72 kg/m   General appearance: alert, no distress, well developed, well nourished Neck with moderately decreased ext, flexion normal and somewhat reduced rotation left and right Neck nontender, no mass, no lymphadenopathy Back: nontender  supraspinatus, tender over left shoulder blade, otherwise should and arm nontender. MSK: no laxity  swelling or deformity of left shoulder or arm.  Bilat internal and external shoulder ROM is somewhat reduced. Mild pain noted with resisted neers tests and ext ROM on left.   Otherwise special tests without tendnerss.  Mild pain with overhead left shoulder motion.   Chest wall with mild tendnerss at upper latera chest, no swelling or bruising Arms neurovascularly intact    Assessment: Encounter Diagnoses  Name Primary?   Decreased ROM of neck Yes   Upper back pain    Acute pain of left shoulder      Plan: Symptoms and exam suggest strain injury of shoulder and upper back, likely some rotator cuff tendonitis as well.  Advised rest, ice therapy, stretching this week After a week he will work on neck ROM exercises and left shoulder rotator cuff exercises.  I demonstrated 5 different shoulder home rehab exercises to do 3 days per week for 2-3 weeks.   During this time period stop bench press, shoulder pressure or other upper body weight lifting until much improved F/u if not seeing significant improvements in 2-3 weeks  Devin Erickson was seen today for pulled muscle.  Diagnoses and all orders for this visit:  Decreased  ROM of neck  Upper back pain  Acute pain of left shoulder    Follow up: prn

## 2021-06-18 ENCOUNTER — Ambulatory Visit: Payer: 59 | Admitting: Medical

## 2021-06-18 ENCOUNTER — Other Ambulatory Visit: Payer: Self-pay

## 2021-06-18 VITALS — BP 130/90 | HR 96 | Temp 98.5°F | Wt 271.0 lb

## 2021-06-18 DIAGNOSIS — R040 Epistaxis: Secondary | ICD-10-CM | POA: Diagnosis not present

## 2021-06-18 DIAGNOSIS — M79602 Pain in left arm: Secondary | ICD-10-CM | POA: Diagnosis not present

## 2021-06-18 DIAGNOSIS — R29898 Other symptoms and signs involving the musculoskeletal system: Secondary | ICD-10-CM

## 2021-06-18 DIAGNOSIS — M549 Dorsalgia, unspecified: Secondary | ICD-10-CM

## 2021-06-18 MED ORDER — CYCLOBENZAPRINE HCL 10 MG PO TABS: 10.0000 mg | ORAL_TABLET | Freq: Every day | ORAL | 0 refills | Status: DC

## 2021-06-18 NOTE — Progress Notes (Signed)
Subjective:  Devin Erickson is a 51 y.o. male who presents for Chief Complaint  Patient presents with   back pain    Back pain, feels at a stand still and not getting any better     Here for recheck on upper back pain and arm pain.  I saw him about a month ago for the same.  He is still having pain or discomfort in the upper back, less so in the arm.  He has been using the exercises and stretches we discussed last time.  He saw his chiropractor a few days after I saw him last visit and they recommended some of the same therapy exercises.  At his visit a month ago he noted pains in left upper back, left shoulder, left arm, left chest wall, shoulder girdle.  No fall, but may have injured himself weight lifting.  Pain started day after lifting heavy.  Started having pains the day after bench press exercise.  Hasn't stopped exercising but has lightened up on weights.   No left arm weakness, numbness, but some tingling.  No neck pain. Using aleve.  No other aggravating or relieving factors.    He saw ENT recently for recurrent nosebleeds.  They recommended electrocautery.  He is still debating on doing this or not.  They did cautery with silver nitrate in the office.  No other c/o.  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  ROS Otherwise as in subjective above   Objective: BP 130/90    Pulse 96    Temp 98.5 F (36.9 C)    Wt 271 lb (122.9 kg)    BMI 33.87 kg/m   BP Readings from Last 3 Encounters:  06/18/21 130/90  05/20/21 120/80  03/31/21 134/76    General appearance: alert, no distress, well developed, well nourished Neck with moderately decreased ext, flexion normal and somewhat reduced rotation left and right Neck nontender, no mass, no lymphadenopathy Back: nontender supraspinatus, tender over left shoulder blade, otherwise should and arm nontender. MSK: no laxity  swelling or deformity of left shoulder or arm.  Bilat internal and external shoulder ROM is somewhat reduced. Mild pain noted with resisted neers tests and ext ROM on left.   Otherwise special tests without tendnerss.  Mild pain with overhead left shoulder motion.   Chest wall with mild tendnerss at upper latera chest, no swelling or bruising Arms neurovascularly intact    Assessment: Encounter Diagnoses  Name Primary?   Upper back pain Yes   Epistaxis    Decreased ROM of neck    Left arm pain       Plan: He still has 1 specific  area in his upper left back/supraspinatus area that is tender otherwise not as tender today as he was last time on exam.  I recommended he pursue dry needling and therapy with either chiropractor or physical therapy.  He wants to see his chiropractor first.  If he does not feel like things are improving over the next few weeks he will call back for referral to physical therapy.  Continue home exercises and stretching  I reviewed imaging in the chart record including May 21, 2020 thoracic spine x-ray showing degenerative changes and left shoulder x-ray from 2019 that was fairly unremarkable  Nosebleeds-follow-up with ENT for electrocautery  [name] was seen today for back pain.  Diagnoses and all orders for this visit:  Upper back  pain  Epistaxis  Decreased ROM of neck  Left arm pain  Other orders -     cyclobenzaprine (FLEXERIL) 10 MG tablet; Take 1 tablet (10 mg total) by mouth at bedtime.    Follow up: prn

## 2021-08-17 ENCOUNTER — Other Ambulatory Visit: Payer: Self-pay | Admitting: Medical

## 2021-08-22 ENCOUNTER — Ambulatory Visit: Payer: 59 | Admitting: Podiatry

## 2021-08-28 ENCOUNTER — Ambulatory Visit (INDEPENDENT_AMBULATORY_CARE_PROVIDER_SITE_OTHER): Payer: 59 | Admitting: Podiatry

## 2021-08-28 ENCOUNTER — Encounter: Payer: Self-pay | Admitting: Podiatry

## 2021-08-28 DIAGNOSIS — B070 Plantar wart: Secondary | ICD-10-CM | POA: Diagnosis not present

## 2021-08-29 NOTE — Progress Notes (Signed)
Subjective:  ? ?Patient ID: Devin Erickson, male   DOB: 51 y.o.   MRN: [id number]  ? ?HPI ?Patient presents that he does have a chronic lesion on the bottom of the right foot which is started to give him problems again over the last month and its been sore and also he has developed lesions on the fifth toes of both feet ? ? ?ROS ? ? ?   ?Objective:  ?Physical Exam  ?Neurovascular status intact with chronic lesions of fifth metatarsal right that does have a possible verruca type condition versus a porokeratotic type condition with digital deformities of the fifth digit both feet ? ?   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 ?   ?Plan:  ?Reviewed all conditions and discussed.  I did debride some area on the fifth digit may ultimately require arthroplasty but we will hold off and today I debrided the deep lesion plantar right ?   ? ? ?

## 2021-10-08 ENCOUNTER — Ambulatory Visit: Payer: 59 | Admitting: Medical

## 2021-10-08 VITALS — BP 120/80 | HR 96 | Temp 98.7°F | Wt 277.0 lb

## 2021-10-08 DIAGNOSIS — M62838 Other muscle spasm: Secondary | ICD-10-CM | POA: Diagnosis not present

## 2021-10-08 DIAGNOSIS — R200 Anesthesia of skin: Secondary | ICD-10-CM

## 2021-10-08 DIAGNOSIS — M25512 Pain in left shoulder: Secondary | ICD-10-CM

## 2021-10-08 DIAGNOSIS — R29898 Other symptoms and signs involving the musculoskeletal system: Secondary | ICD-10-CM | POA: Diagnosis not present

## 2021-10-08 DIAGNOSIS — M25511 Pain in right shoulder: Secondary | ICD-10-CM

## 2021-10-08 MED ORDER — MELOXICAM 15 MG PO TABS: 15.0000 mg | ORAL_TABLET | Freq: Every day | ORAL | 0 refills | Status: DC

## 2021-10-08 MED ORDER — DIAZEPAM 10 MG PO TABS: 10.0000 mg | ORAL_TABLET | Freq: Every evening | ORAL | 0 refills | Status: DC | PRN

## 2021-10-08 NOTE — Progress Notes (Signed)
Subjective:  Devin Erickson is a 51 y.o. male who presents for Chief Complaint  Patient presents with   right hand     Right hand- loss strength in hand. Feels like a nerve as it feels in shoulder with tightness and then fingers are numbness. Going on 2.5 weeks     Here for several symptoms.  He notes for at least 3 weeks or longer has had issues with right shoulder pain, right arm pain, and some new numbness in the right hand.  He has been getting numbness specifically in the right middle and ring fingers.  He definitely feels weaker in his right arm and right hand in particular.  He goes to the gym regularly and lifts weights and he can tell a distinct change in pain and weakness.  He has had pains in both shoulders but lately has the right shoulder giving him more problems.  He has pain in the upper back and right shoulder.  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  He is right-handed.  No recent trauma or injury or fall.  No other aggravating or relieving factors.    No other c/o.  The following portions of the patient's history were reviewed and updated as appropriate: allergies, current medications, past family history, past medical history, past social history, past surgical history and problem list.  ROS Otherwise as in subjective above  Objective: BP 120/80   Pulse 96   Temp 98.7 F (37.1 C)   Wt 277 lb (125.6 kg)   BMI 34.62 kg/m   General appearance: alert, no distress, well developed, well nourished Neck nontender but decreased extension, flexion normal, rotation to the right is limited to about 60 degrees otherwise no lymphadenopathy or thyromegaly of neck. Tender of the right biceps origin, otherwise hand and arm nontender, no obvious swelling or deformity.  Range of motion is relatively full but he does have weakness and some pain with step-off test and internal range of motion, no laxity no swelling there seems to be some  supraspinatus tendon weakness in general.  Right hand grip strength seems weaker than the left although still 4-5/5 Tender upper back supraspinatus and rhomboid region.  Otherwise back nontender without deformity Other than noted above arms neurovascularly intact Negative Phalen's and Tinel's    Assessment: Encounter Diagnoses  Name Primary?   Decreased ROM of neck Yes   Bilateral shoulder pain, unspecified chronicity    Hand numbness    Spasm of muscle      Plan: We discussed findings and symptoms.  We discussed possibility of neck radicular issues cervical spine versus possibly some carpal tunnel syndrome.  I advised using arm sling and carpal tunnel wrist splint over the next week on and off, particular splint at night.  Use pillows at night to support arm to reduce pain.  Continue stretching.  Begin Valium as needed for the next few days.  Use meloxicam for the next week.  Discussed risk and benefits and proper use of medication.  Avoid heavy lifting with right arm for now and relative rest for now.  Referral to orthopedics for further evaluation and treatment recommendations  Angelina was seen today for right hand .  Diagnoses and all orders for this visit:  Decreased ROM of neck -     AMB referral to orthopedics  Bilateral shoulder pain, unspecified chronicity -     AMB referral to orthopedics  Hand numbness -  AMB referral to orthopedics  Spasm of muscle -     AMB referral to orthopedics  Other orders -     meloxicam (MOBIC) 15 MG tablet; Take 1 tablet (15 mg total) by mouth daily. -     diazepam (VALIUM) 10 MG tablet; Take 1 tablet (10 mg total) by mouth at bedtime as needed for anxiety.    Follow up: pending referral

## 2021-10-15 ENCOUNTER — Ambulatory Visit (INDEPENDENT_AMBULATORY_CARE_PROVIDER_SITE_OTHER): Payer: 59 | Admitting: Orthopaedic Surgery

## 2021-10-15 ENCOUNTER — Ambulatory Visit (INDEPENDENT_AMBULATORY_CARE_PROVIDER_SITE_OTHER): Payer: 59

## 2021-10-15 ENCOUNTER — Encounter: Payer: Self-pay | Admitting: Orthopaedic Surgery

## 2021-10-15 DIAGNOSIS — M25511 Pain in right shoulder: Secondary | ICD-10-CM

## 2021-10-15 DIAGNOSIS — M542 Cervicalgia: Secondary | ICD-10-CM | POA: Diagnosis not present

## 2021-10-15 DIAGNOSIS — R200 Anesthesia of skin: Secondary | ICD-10-CM

## 2021-10-15 DIAGNOSIS — M5412 Radiculopathy, cervical region: Secondary | ICD-10-CM | POA: Diagnosis not present

## 2021-10-15 MED ORDER — METHYLPREDNISOLONE 4 MG PO TBPK: ORAL_TABLET | ORAL | 0 refills | Status: DC

## 2021-10-15 NOTE — Progress Notes (Signed)
Office Visit Note   Patient: Devin Erickson           Date of Birth: 17-Sep-1970           MRN: [id number] Visit Date: 10/15/2021              Requested by: Jac Canavan, PA-C [address] [place],  [place] [address] PCP: Jac Canavan, PA-C   Assessment & Plan: Visit Diagnoses:  1. Acute pain of right shoulder     Plan: This is a pleasant 51 year old gentleman with acute onset 3 weeks ago of neck pain with shooting pain down his arm and associated numbness in his hand .he is also concerned because he is gotten significantly weaker in his right arm and his grip strength.  He is right-hand dominant.  He denies any injuries or change in activity.  He was given meloxicam and a muscle relaxant.  He is having difficulty sleeping.  On examination he does have decreased grip strength and arm strength.  He also has reproducible symptoms that do go down to his bicep with extending his neck.  His shoulder exam is fairly benign.  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  Because of the acute onset of symptoms as well as the weakness and numbness we recommend an MRI.  Also recommend electrodiagnostic studies of the right upper extremity.  We will also call him in a Medrol Dosepak.  He understands to take this with food and not to take other anti-inflammatories and his meloxicam with this and will stop taking them until he is done with the steroid taper. Think he might have a combination of issues causing his pain.  There certainly are some problems with the cervical spine with demonstrable arthritis.  There certainly is a possibility that the arm numbness and tingling is related to the neck.  Could have a brachial plexus inflammation and, hopefully, the EMGs nerve conduction studies will be diagnostic   Follow-Up Instructions: No follow-ups on file.   Orders:  Orders Placed This Encounter  Procedures   XR Shoulder Right    XR Cervical Spine 2 or 3 views   No orders of the defined types were placed in this encounter.     Procedures: No procedures performed   Clinical Data: No additional findings.   Subjective: Chief Complaint  Patient presents with   Right Shoulder - Pain   Neck - Pain  Patient presents today for right posterior shoulder pain. He said that it started about 3 weeks ago, with no injury. He has pain that will radiate down his right arm, accompanied with right hand numbness. He has good range of motion with his arm/shoulder. No neck pain that he has noticed. He saw his PCP and was given a muscle relaxer to take at night. He is taking Advil during the day. He is right hand dominant.     Review of Systems  All other systems reviewed and are negative.    Objective: Vital Signs: There were no vitals taken for this visit.  Physical Exam Constitutional:      Appearance: Normal appearance.  Skin:    General: Skin is warm and dry.  Neurological:     Mental Status: He is alert.  Psychiatric:        Mood and Affect: Mood normal.        Behavior: Behavior normal.     Ortho Exam Examination of the  cervical spine:.  He does have tenderness in the paravertebral muscles on the right with some spasm.  No palpable deformity.  He has good flexion to his chin but extension is very difficult and with prolonged extension does reproduce radicular symptoms running down his right arm.  Turning side to side is limited.  He has slight decreased strength on the right arm with resisted abduction flexion and extension.  Also decreased grip strength.  This was compared to the left arm and was significantly different.  He does have an easily palpable radial pulse.  He has altered sensation in all the fingers of his hand.  Negative Tinel's sign. Examination of his shoulder he has full forward extension.  Empty can testing was not painful but he is again weak in this arm.  He does have some mild impingement  findings.  No tenderness over the shoulder itself Specialty Comments:  No specialty comments available.  Imaging: No results found.   PMFS History: Patient Active Problem List   Diagnosis Date Noted   Left arm pain 06/18/2021   Upper back pain 06/18/2021   Decreased ROM of neck 06/18/2021   Screening for heart disease 02/05/2021   Screening for prostate cancer 02/05/2021   Sleep disturbance 02/05/2021   Chronic right-sided thoracic back pain 05/08/2020   Fatigue 07/10/2019   Raynaud's phenomenon without gangrene 07/10/2019   Erectile dysfunction 07/10/2019   At high risk for bleeding 07/10/2019   ANA positive 07/10/2019   Elevated liver function tests 03/08/2019   Need for influenza vaccination 02/16/2019   Vaccine counseling 02/16/2019   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   Chronic left shoulder pain 05/02/2018   Benign paroxysmal positional vertigo due to bilateral vestibular disorder 06/24/2017   Encounter for health maintenance examination in adult 07/22/2016   Chronic pain of right knee 07/22/2016   Atopic dermatitis 12/12/2015   Tension headache, chronic 10/04/2015   Cough variant asthma vs UACS 09/04/2015   Epistaxis 08/01/2014   Rhinitis, allergic 08/01/2014   Hyperlipidemia 08/01/2014   OSA (obstructive sleep apnea) 08/01/2014   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   History of sleep apnea 04/2012   per overnight screen/oximetry    Obesity    Thalassemia minor    Wears glasses    Wears glasses     Family History  Problem Relation Age of Onset   Cancer Mother 69       colon   Hypertension Mother    Dementia Father    Cancer Father        pancreas   Heart disease Neg Hx    Lung disease Neg Hx     Past Surgical History:  Procedure Laterality Date   COLONOSCOPY  04/27/2010   [facility]  physicians, normal per pt   TONSILLECTOMY     TONSILLECTOMY AND ADENOIDECTOMY     age 53yo   Social History   Occupational History   Not on file  Tobacco Use   Smoking status: Never   Smokeless tobacco: Never  Substance and Sexual Activity   Alcohol use: No    Comment: few beers a week.maybe 3-4   Drug use: No   Sexual activity: Not on file

## 2021-10-24 ENCOUNTER — Telehealth: Payer: Self-pay

## 2021-10-24 ENCOUNTER — Ambulatory Visit
Admission: RE | Admit: 2021-10-24 | Discharge: 2021-10-24 | Disposition: A | Discharge status: Home / Self Care | Payer: 59 | Source: Ambulatory Visit | Attending: Orthopaedic Surgery | Admitting: Orthopaedic Surgery

## 2021-10-24 DIAGNOSIS — M542 Cervicalgia: Secondary | ICD-10-CM

## 2021-10-24 NOTE — Progress Notes (Signed)
I called and left a message re the MRI results-needs F/U MRI in 2 months with contrast. In the meantime would have Dr Alvester Morin consider a cervical ESI. Should have EMG's scheduled-check on that as well

## 2021-10-24 NOTE — Telephone Encounter (Signed)
[facility] calling a call report on the Cervical MRI done this morning

## 2021-10-27 ENCOUNTER — Telehealth: Payer: Self-pay | Admitting: Physical Medicine and Rehabilitation

## 2021-10-27 ENCOUNTER — Other Ambulatory Visit: Payer: Self-pay | Admitting: Physician Assistant

## 2021-10-27 DIAGNOSIS — M5412 Radiculopathy, cervical region: Secondary | ICD-10-CM

## 2021-10-27 NOTE — Telephone Encounter (Signed)
Pt called requesting a call back for referral appt. Pt phone number is [phone]

## 2021-11-04 ENCOUNTER — Telehealth: Payer: Self-pay

## 2021-11-04 NOTE — Telephone Encounter (Signed)
Devin Erickson with [facility] called stating that results are in chart for an Addendum for MRI C-Spine.  Please advise.  Thank you.

## 2021-11-04 NOTE — Progress Notes (Signed)
Call [facility] and relate findings on MRI with ? Sjogren or HIV or metastatic disease

## 2021-11-05 ENCOUNTER — Ambulatory Visit (INDEPENDENT_AMBULATORY_CARE_PROVIDER_SITE_OTHER): Payer: 59 | Admitting: Physical Medicine and Rehabilitation

## 2021-11-05 ENCOUNTER — Encounter: Payer: Self-pay | Admitting: Physical Medicine and Rehabilitation

## 2021-11-05 ENCOUNTER — Telehealth: Payer: Self-pay | Admitting: Medical

## 2021-11-05 VITALS — BP 153/106 | HR 96

## 2021-11-05 DIAGNOSIS — R202 Paresthesia of skin: Secondary | ICD-10-CM

## 2021-11-05 DIAGNOSIS — M4722 Other spondylosis with radiculopathy, cervical region: Secondary | ICD-10-CM

## 2021-11-05 DIAGNOSIS — M5412 Radiculopathy, cervical region: Secondary | ICD-10-CM | POA: Diagnosis not present

## 2021-11-05 DIAGNOSIS — M4802 Spinal stenosis, cervical region: Secondary | ICD-10-CM | POA: Diagnosis not present

## 2021-11-05 NOTE — Telephone Encounter (Signed)
Pt has an appt with ortho today and will call us back after he finds out what the findings were at his appt today

## 2021-11-05 NOTE — Telephone Encounter (Signed)
Please call patient.  I got a call from the orthopedic office.  He had a scan with orthopedics but there was some unusual findings.  Please get him in at his convenience to do some additional labs and discuss the findings.  These are definitely not expected and somewhat unusual, so lets check a few things in this regard.   FYI for [facility] Consider the following labs: TSH, RPR, HIV, Hep C, sed rate, ANA, HgbA1C, iron and ferritin, anti-Ro (SS-a), anti La (SS-b) antibodies

## 2021-11-05 NOTE — Progress Notes (Unsigned)
Pt state neck pain that travels down his right shoulder. Pt state he has weakness with his right shoulder and hand. Pt state it hard for him to lift items. Pt state he takes over the counter meds to help ease his pain and weakness.  Numeric Pain Rating Scale and Functional Assessment Average Pain 9 Pain Right Now 7 My pain is constant and tingling Pain is worse with: some activites and lifting Pain improves with: medication   In the last MONTH (on 0-10 scale) has pain interfered with the following?  1. General activity like being  able to carry out your everyday physical activities such as walking, climbing stairs, carrying groceries, or moving a chair?  Rating(5)  2. Relation with others like being able to carry out your usual social activities and roles such as  activities at home, at work and in your community. Rating(6)  3. Enjoyment of life such that you have  been bothered by emotional problems such as feeling anxious, depressed or irritable?  Rating(7)

## 2021-11-05 NOTE — Progress Notes (Unsigned)
Devin Erickson - 51 y.o. male MRN [id number]  Date of birth: 1971/02/12  Office Visit Note: Visit Date: 11/05/2021 PCP: Jac Canavan, PA-C Referred by: Jac Canavan, PA-C  Subjective: Chief Complaint  Patient presents with   Neck - Pain   Right Shoulder - Weakness   Right Hand - Numbness, Weakness   HPI: Devin Erickson is a 51 y.o. male who comes in today for evaluation of chronic, worsening and severe right sided neck pain radiating to shoulder and down arm to hand. Patient reports symptoms have been ongoing for 3 months and is exacerbated by movement and activity. Patient states his pain is pain is constant and unpredictable. He describes his pain as sore, aching and numb sensation, currently rates as 9 out of 10. He reports some relief of pain with home exercise regimen, rest and use of Tylenol. Patient states he has attended formal chiropractic treatments in the past with Dr. Tereso Newcomer at [facility] and [facility], reports minimal relief of pain with these therapies. Patients recent cervical MRI exhibits multi level facet arthropathy, multi level foraminal narrowing at C3-C4, C4-C5 and C6-C7, most severe on the right at C3-C4. No high grade spinal canal stenosis noted. Addendum to cervical MRI suggest numerous small cysts within parotid glands, could signify possible Sjogren syndrome or HIV-associated salivary gland disease. Patient states he is scheduled to follow up with his primary care provider Crosby Oyster, PA to discuss results and further evaluation. Patient states his pain is negatively impacting his daily life. States he struggles to grip objects and has difficulty performing tasks with right arm/hand. Patient has appointment in our office for right upper extremity NCV with EMG on 12/02/2021. Patient denies recent trauma or falls.    Review of Systems  Musculoskeletal:  Positive for neck pain.  Neurological:  Positive for tingling, sensory  change and weakness.  All other systems reviewed and are negative.  Otherwise per HPI.  Assessment & Plan: Visit Diagnoses:    ICD-10-CM   1. Radiculopathy, cervical region  M54.12     2. Other spondylosis with radiculopathy, cervical region  M47.22     3. Foraminal stenosis of cervical region  M48.02     4. Paresthesia of skin  R20.2        Plan: Findings:  Chronic, worsening and severe right sided neck pain radiating to shoulder and down arm to hand. Patient continues to have severe pain despite good conservative therapies such as chiropractic treatments, home exercise regimen, rest and use of medications. Patients clinical presentation and exam are consistent with cervical radiculopathy, his recent cervical MRI imaging does not directly correlate with his symptoms, however he does have decreased strength to right arm and decreased grip strength to right hand. We believe the next step is to perform a diagnostic and hopefully therapeutic right C7-T1 interlaminar epidural steroid injection. Patient is not currently taking anticoagulant medications. If patient gets significant and sustained pain relief with epidural steroid injection we are able to repeat this procedure infrequently. We will see patient back in a few weeks for right upper extremity nerve study. Patient encouraged to remain active as tolerated.     Meds & Orders: No orders of the defined types were placed in this encounter.  No orders of the defined types were placed in this encounter.   Follow-up: Return for Right C7-T1 interlaminar epidural steroid injection.   Procedures: No procedures performed      Clinical History: MRI CERVICAL SPINE WITHOUT  CONTRAST  Alignment: Straightening of the expected cervical lordosis. No significant spondylolisthesis.   Vertebrae: Vertebral body height is maintained. 18 mm T2 STIR hyperintense and T1 hypointense lesion within the T1 vertebral body. Nonspecific edema signal within the  spinous processes at C6 and C7.   Cord: No signal abnormality identified within the cervical spinal cord.   Posterior Fossa, vertebral arteries, paraspinal tissues: No abnormality identified within included portions of the posterior fossa. Flow voids preserved within the imaged cervical vertebral arteries. Heterogeneous appearance of the visualized parotid glands with suggestion of numerous small cysts within the glands. No paraspinal mass or collection. Edema signal within the interspinous spaces at C5-C6, C6-C7, C7-T1 and T1-T2.   Multilevel disc degeneration, greatest at C4-C5 (moderate to moderately advanced), C5-C6 (moderate to moderately advanced) and C6-C7 (moderate).   C2-C3: No significant disc herniation or spinal canal stenosis. Uncovertebral hypertrophy on the right. Facet arthrosis. Mild right neural foraminal narrowing.   C3-C4: No significant disc herniation or spinal canal stenosis. Uncovertebral hypertrophy (greater on the right). Facet arthrosis. Bilateral neural foraminal narrowing (moderate right, mild left).   C4-C5: Posterior disc osteophyte complex with bilateral uncovertebral hypertrophy. Mild facet arthrosis. Mild effacement of the ventral thecal sac (without significant spinal cord mass effect). Mild-to-moderate bilateral neural foraminal narrowing.   C5-C6: Shallow disc bulge. Uncovertebral hypertrophy (greater on the left). Facet arthrosis. No significant spinal canal stenosis. Mild left neural foraminal narrowing.   C6-C7: Disc bulge. Uncovertebral per trapezii (greater on the left). No significant spinal canal stenosis. Mild-to-moderate left neural foraminal narrowing.   C7-T1: This level is imaged in the sagittal plane only. No significant disc herniation or stenosis.   Impressions #2 and #3 will be called to the ordering clinician or representative by the Radiologist Assistant, and communication documented in the PACS or [employer].    IMPRESSION: Indeterminate 18 mm T2 STIR hyperintense and T1 hypointense lesion within the T1 vertebral body. While this may reflect an atypical hemangioma, alternative etiologies (including osseous metastatic disease) cannot be excluded. Short-interval 2-3 month MRI follow-up without and with contrast is recommended to monitor this finding.   Marrow edema within the C6 and C7 spinous processes. Edema signal also present within the interspinous ligaments at C5-C6, C6-C7, C7-T1 and T1-T2. Findings are nonspecific, but possibly degenerative or posttraumatic in etiology. If there has been recent trauma, the interspinous ligament edema could reflect interspinous ligament injury and a cervical spine CT should be considered to exclude C6/C7 spinous process fractures. Additionally, attention to these sites recommended at MRI imaging follow-up.   Cervical spondylosis, as outlined. No more than mild relative spinal canal narrowing. Multilevel foraminal stenosis, as detailed and greatest on the right at C3-C4 (moderate), bilaterally at C4-C5 (mild-to-moderate) and on the left at C6-C7 (mild-to-moderate).   Nonspecific straightening of the expected cervical lordosis.  On: 10/24/2021 08:09 --------------------------------------- ADDENDUM: Finding omitted from impression: Heterogeneous appearance of the visualized parotid glands with suggestion of numerous small cysts within the glands. These findings may be seen in the setting of Sjogren syndrome or HIV-associated salivary gland disease.   These results will be called to the ordering clinician or representative by the Radiologist Assistant, and communication documented in the PACS or [employer].  On: 11/03/2021 19:40   He reports that he has never smoked. He has never used smokeless tobacco. No results for input(s): "HGBA1C", "LABURIC" in the last 8760 hours.  Objective:  VS:  HT:    WT:   BMI:     BP:(!) 153/106  HR:96bpm   TEMP: ( )  RESP:  Physical Exam Vitals and nursing note reviewed.  HENT:     Head: Normocephalic and atraumatic.     Right Ear: External ear normal.     Left Ear: External ear normal.     Nose: Nose normal.     Mouth/Throat:     Mouth: Mucous membranes are moist.  Eyes:     Pupils: Pupils are equal, round, and reactive to light.  Cardiovascular:     Rate and Rhythm: Normal rate.     Pulses: Normal pulses.  Pulmonary:     Effort: Pulmonary effort is normal.  Abdominal:     General: Abdomen is flat. There is no distension.  Musculoskeletal:        General: Tenderness present.     Cervical back: Tenderness present.     Comments: Discomfort noted with flexion, extension and side-to-side rotation. Patient has 4/5 strength to right upper extremity, decreased right grip strength when compared to left. There is no atrophy of the hands intrinsically. Altered sensation to digits of right hand. Negative Hoffman's sign.   Skin:    General: Skin is warm and dry.     Capillary Refill: Capillary refill takes less than 2 seconds.  Neurological:     Mental Status: He is alert and oriented to person, place, and time.     Motor: Weakness present.  Psychiatric:        Mood and Affect: Mood normal.        Behavior: Behavior normal.     Ortho Exam  Imaging: No results found.  Past Medical/Family/Surgical/Social History: Medications & Allergies reviewed per EMR, new medications updated. Patient Active Problem List   Diagnosis Date Noted   Radiculopathy, cervical region 10/15/2021   Left arm pain 06/18/2021   Upper back pain 06/18/2021   Decreased ROM of neck 06/18/2021   Screening for heart disease 02/05/2021   Screening for prostate cancer 02/05/2021   Sleep disturbance 02/05/2021   Chronic right-sided thoracic back pain 05/08/2020   Fatigue 07/10/2019   Raynaud's phenomenon without gangrene 07/10/2019   Erectile dysfunction 07/10/2019   At high risk for bleeding 07/10/2019   ANA  positive 07/10/2019   Elevated liver function tests 03/08/2019   Need for influenza vaccination 02/16/2019   Vaccine counseling 02/16/2019   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   Chronic left shoulder pain 05/02/2018   Benign paroxysmal positional vertigo due to bilateral vestibular disorder 06/24/2017   Encounter for health maintenance examination in adult 07/22/2016   Chronic pain of right knee 07/22/2016   Atopic dermatitis 12/12/2015   Tension headache, chronic 10/04/2015   Cough variant asthma vs UACS 09/04/2015   Epistaxis 08/01/2014   Rhinitis, allergic 08/01/2014   Hyperlipidemia 08/01/2014   OSA (obstructive sleep apnea) 08/01/2014   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   History of sleep apnea 04/2012   per overnight screen/oximetry    Obesity    Thalassemia minor    Wears glasses    Wears glasses    Family History  Problem Relation Age of Onset   Cancer Mother 58       colon   Hypertension Mother    Dementia Father    Cancer Father  pancreas   Heart disease Neg Hx    Lung disease Neg Hx    Past Surgical History:  Procedure Laterality Date   COLONOSCOPY  04/27/2010   [facility], normal per pt   TONSILLECTOMY     TONSILLECTOMY AND ADENOIDECTOMY     age 47yo   Social History   Occupational History   Not on file  Tobacco Use   Smoking status: Never   Smokeless tobacco: Never  Substance and Sexual Activity   Alcohol use: No    Comment: few beers a week.maybe 3-4   Drug use: No   Sexual activity: Not on file

## 2021-11-10 ENCOUNTER — Telehealth: Payer: Self-pay | Admitting: Orthopaedic Surgery

## 2021-11-10 NOTE — Telephone Encounter (Signed)
Called patient left message on voicemail to return call to schedule an MRI review with Dr. Cleophas Dunker

## 2021-11-18 ENCOUNTER — Encounter: Payer: Self-pay | Admitting: Medical

## 2021-11-18 ENCOUNTER — Ambulatory Visit (INDEPENDENT_AMBULATORY_CARE_PROVIDER_SITE_OTHER): Payer: 59 | Admitting: Medical

## 2021-11-18 VITALS — BP 140/90 | HR 100 | Temp 98.8°F | Wt 276.4 lb

## 2021-11-18 DIAGNOSIS — H04123 Dry eye syndrome of bilateral lacrimal glands: Secondary | ICD-10-CM

## 2021-11-18 DIAGNOSIS — L630 Alopecia (capitis) totalis: Secondary | ICD-10-CM | POA: Diagnosis not present

## 2021-11-18 DIAGNOSIS — R937 Abnormal findings on diagnostic imaging of other parts of musculoskeletal system: Secondary | ICD-10-CM | POA: Diagnosis not present

## 2021-11-18 DIAGNOSIS — R059 Cough, unspecified: Secondary | ICD-10-CM | POA: Diagnosis not present

## 2021-11-18 MED ORDER — BENZONATATE 200 MG PO CAPS: 200.0000 mg | ORAL_CAPSULE | Freq: Three times a day (TID) | ORAL | 0 refills | Status: DC | PRN

## 2021-11-18 MED ORDER — DIAZEPAM 10 MG PO TABS: 10.0000 mg | ORAL_TABLET | Freq: Every evening | ORAL | 0 refills | Status: DC | PRN

## 2021-11-18 MED ORDER — ALBUTEROL SULFATE HFA 108 (90 BASE) MCG/ACT IN AERS: 2.0000 | INHALATION_SPRAY | Freq: Four times a day (QID) | RESPIRATORY_TRACT | 0 refills | Status: DC | PRN

## 2021-11-18 NOTE — Progress Notes (Signed)
Subjective:  Devin Erickson is a 51 y.o. male who presents for Chief Complaint  Patient presents with   Acute Visit    Bad cough that started last week. Covid test done Saturday and yesterday they both were negative.     Here for illness. Started 5-6 days ago.  Progressively has gotten worse.  He notes cough.  Denies fever, some ache, no chills.  Some irritated throat from coughing.   Some runny water eyes, no ear pain.  No NVD.  No wheezing, but some SOB after coughing fits.  Using robitussin.  No sneezing.  Did 2 recent covid tests that were negative.  No other aggravating or relieving factors.    He had a MRI recently of his cervical spine.  The orthopedic called our office about some findings that were unusual with the scan.  There was a question mark about Sjogren's versus some other differential diagnoses that could cause the findings of his parotid glands.  Additional evaluation was recommended.  He is here to pursue those labs as well.  No other c/o.  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     Multiple Vitamin (MULTIVITAMIN) tablet Take 1 tablet by mouth daily.     rosuvastatin (CRESTOR) 20 MG tablet TAKE 1 TABLET BY MOUTH EVERYDAY AT BEDTIME 90 tablet 3   No current facility-administered medications on file prior to visit.    The following portions of the patient's history were reviewed and updated as appropriate: allergies, current medications, past family history, past medical history, past social history, past surgical history  and problem list.  ROS Otherwise as in subjective above    Objective: BP 140/90   Pulse 100   Temp 98.8 F (37.1 C)   Wt 276 lb 6.4 oz (125.4 kg)   SpO2 96%   BMI 34.55 kg/m   Wt Readings from Last 3 Encounters:  11/18/21 276 lb 6.4 oz (125.4 kg)  10/08/21 277 lb (125.6 kg)  06/18/21 271 lb (122.9 kg)   General appearance: alert, no distress, well developed, well nourished HEENT: normocephalic, sclerae anicteric, conjunctiva moist but with some erythema bilaterally, no drainage or pus, TMs pearly, nares patent, no discharge or erythema, pharynx normal Oral cavity: MMM, no lesions Neck: supple, no lymphadenopathy, no thyromegaly, no masses Heart: RRR, normal S1, S2, no murmurs Lungs: CTA bilaterally, no wheezes, rhonchi, or rales Pulses: 2+ radial pulses, 2+ pedal pulses, normal cap refill Ext: no edema   Assessment: Encounter Diagnoses  Name Primary?   Abnormal MRI, cervical spine Yes   Dry eyes    Cough, unspecified type    Alopecia areata totalis      Plan: We discussed his cough and dry eyes, symptoms suggest more of a cough variant asthma or allergy issue.  Continue Zyrtec for the time being, add Tessalon Perles and inhaler as below.  Rest, hydrate well.  If not much improved within the  next 3 to 5 days then let me know  We discussed his recent MRI findings and possible differential.  It is unclear what is going on here.  He does note history of dry eyes and he has had prior concern for autoimmune given the prior totalis alopecia.  Additional labs to further evaluate this finding with labs below.  Consider getting a specialist involved pending labs  He is seeing orthopedics for his shoulder and neck currently.  Refill of Valium at his request today for spasm  Deckard was seen today for acute visit.  Diagnoses and all orders for this visit:  Abnormal MRI, cervical spine -     RPR -     HIV Antibody (routine testing w rflx) -     Hepatitis C antibody -      ANA -     Sedimentation rate -     Sjogren's syndrome antibods(ssa + ssb)  Dry eyes -     RPR -     HIV Antibody (routine testing w rflx) -     Hepatitis C antibody -     ANA -     Sedimentation rate -     Sjogren's syndrome antibods(ssa + ssb)  Cough, unspecified type  Alopecia areata totalis -     RPR -     HIV Antibody (routine testing w rflx) -     Hepatitis C antibody -     ANA -     Sedimentation rate -     Sjogren's syndrome antibods(ssa + ssb)  Other orders -     benzonatate (TESSALON) 200 MG capsule; Take 1 capsule (200 mg total) by mouth 3 (three) times daily as needed for cough. -     diazepam (VALIUM) 10 MG tablet; Take 1 tablet (10 mg total) by mouth at bedtime as needed for anxiety. -     albuterol (VENTOLIN HFA) 108 (90 Base) MCG/ACT inhaler; Inhale 2 puffs into the lungs every 6 (six) hours as needed for wheezing or shortness of breath.    Follow up: Pending labs

## 2021-11-19 LAB — SJOGREN'S SYNDROME ANTIBODS(SSA + SSB)
ENA SSA (RO) Ab: <0.2 AI (ref 0.0–0.9)
ENA SSB (LA) Ab: <0.2 AI (ref 0.0–0.9)

## 2021-11-19 LAB — ANA: Anti Nuclear Antibody (ANA): POSITIVE — AB

## 2021-11-19 LAB — SEDIMENTATION RATE: Sed Rate: 23 mm/hr (ref 0–30)

## 2021-11-19 LAB — HIV ANTIBODY (ROUTINE TESTING W REFLEX): HIV Screen 4th Generation wRfx: NONREACTIVE

## 2021-11-19 LAB — RPR: RPR Ser Ql: NONREACTIVE

## 2021-11-19 LAB — HEPATITIS C ANTIBODY: Hep C Virus Ab: NONREACTIVE

## 2021-11-24 ENCOUNTER — Encounter: Payer: Self-pay | Admitting: Physical Medicine and Rehabilitation

## 2021-11-24 ENCOUNTER — Ambulatory Visit: Payer: Self-pay

## 2021-11-24 ENCOUNTER — Ambulatory Visit (INDEPENDENT_AMBULATORY_CARE_PROVIDER_SITE_OTHER): Payer: 59 | Admitting: Physical Medicine and Rehabilitation

## 2021-11-24 VITALS — BP 148/97 | HR 108

## 2021-11-24 DIAGNOSIS — M5416 Radiculopathy, lumbar region: Secondary | ICD-10-CM | POA: Diagnosis not present

## 2021-11-24 MED ORDER — METHYLPREDNISOLONE ACETATE 80 MG/ML IJ SUSP
80.0000 mg | Freq: Once | INTRAMUSCULAR | Status: AC
  Administered 2021-11-24: 80 mg

## 2021-11-24 NOTE — Procedures (Signed)
Cervical Epidural Steroid Injection - Interlaminar Approach with Fluoroscopic Guidance  Patient: Devin Erickson      Date of Birth: 02/03/1971 MRN: [id number] PCP: Jac Canavan, PA-C      Visit Date: 11/24/2021   Universal Protocol:    Date/Time: 11/24/2308:37 PM  Consent Given By: the patient  Position: PRONE  Additional Comments: Vital signs were monitored before and after the procedure. Patient was prepped and draped in the usual sterile fashion. The correct patient, procedure, and site was verified.   Injection Procedure Details:   Procedure diagnoses: Lumbar radiculopathy [M54.16]    Meds Administered:  Meds ordered this encounter  Medications   methylPREDNISolone acetate (DEPO-MEDROL) injection 80 mg     Laterality: Right  Location/Site: C7-T1  Needle: 3.5 in., 20 ga. Tuohy  Needle Placement: Paramedian epidural space  Findings:  -Comments: Excellent flow of contrast into the epidural space.  Procedure Details: Using a paramedian approach from the side mentioned above, the region overlying the inferior lamina was localized under fluoroscopic visualization and the soft tissues overlying this structure were infiltrated with 4 ml. of 1% Lidocaine without Epinephrine. A # 20 gauge, Tuohy needle was inserted into the epidural space using a paramedian approach.  The epidural space was localized using loss of resistance along with contralateral oblique bi-planar fluoroscopic views.  After negative aspirate for air, blood, and CSF, a 2 ml. volume of Isovue-250 was injected into the epidural space and the flow of contrast was observed. Radiographs were obtained for documentation purposes.   The injectate was administered into the level noted above.  Additional Comments:  The patient tolerated the procedure well Dressing: 2 x 2 sterile gauze and Band-Aid    Post-procedure details: Patient was observed during the procedure. Post-procedure instructions were  reviewed.  Patient left the clinic in stable condition.

## 2021-11-24 NOTE — Patient Instructions (Signed)

## 2021-11-24 NOTE — Progress Notes (Signed)
Devin Erickson - 51 y.o. male MRN [id number]  Date of birth: Oct 03, 1970  Office Visit Note: Visit Date: 11/24/2021 PCP: Jac Canavan, PA-C Referred by: Jac Canavan, PA-C  Subjective: Chief Complaint  Patient presents with   Neck - Edema   Right Shoulder - Pain   Right Arm - Weakness, Numbness   Right Hand - Weakness, Numbness   HPI:  Devin Erickson is a 51 y.o. male who comes in today at the request of Ellin Goodie, FNP for planned Right C7-T1 Cervical Interlaminar epidural steroid injection with fluoroscopic guidance.  The patient has failed conservative care including home exercise, medications, time and activity modification.  This injection will be diagnostic and hopefully therapeutic.  Please see requesting physician notes for further details and justification.   ROS Otherwise per HPI.  Assessment & Plan: Visit Diagnoses:    ICD-10-CM   1. Lumbar radiculopathy  M54.16 XR C-ARM NO REPORT    Epidural Steroid injection    methylPREDNISolone acetate (DEPO-MEDROL) injection 80 mg      Plan: No additional findings.   Meds & Orders:  Meds ordered this encounter  Medications   methylPREDNISolone acetate (DEPO-MEDROL) injection 80 mg    Orders Placed This Encounter  Procedures   XR C-ARM NO REPORT   Epidural Steroid injection    Follow-up: Return for visit to requesting provider as needed.   Procedures: No procedures performed  Cervical Epidural Steroid Injection - Interlaminar Approach with Fluoroscopic Guidance  Patient: Devin Erickson      Date of Birth: 05-21-1970 MRN: [id number] PCP: Jac Canavan, PA-C      Visit Date: 11/24/2021   Universal Protocol:    Date/Time: 11/24/2308:37 PM  Consent Given By: the patient  Position: PRONE  Additional Comments: Vital signs were monitored before and after the procedure. Patient was prepped and draped in the usual sterile fashion. The correct patient, procedure, and site was  verified.   Injection Procedure Details:   Procedure diagnoses: Lumbar radiculopathy [M54.16]    Meds Administered:  Meds ordered this encounter  Medications   methylPREDNISolone acetate (DEPO-MEDROL) injection 80 mg     Laterality: Right  Location/Site: C7-T1  Needle: 3.5 in., 20 ga. Tuohy  Needle Placement: Paramedian epidural space  Findings:  -Comments: Excellent flow of contrast into the epidural space.  Procedure Details: Using a paramedian approach from the side mentioned above, the region overlying the inferior lamina was localized under fluoroscopic visualization and the soft tissues overlying this structure were infiltrated with 4 ml. of 1% Lidocaine without Epinephrine. A # 20 gauge, Tuohy needle was inserted into the epidural space using a paramedian approach.  The epidural space was localized using loss of resistance along with contralateral oblique bi-planar fluoroscopic views.  After negative aspirate for air, blood, and CSF, a 2 ml. volume of Isovue-250 was injected into the epidural space and the flow of contrast was observed. Radiographs were obtained for documentation purposes.   The injectate was administered into the level noted above.  Additional Comments:  The patient tolerated the procedure well Dressing: 2 x 2 sterile gauze and Band-Aid    Post-procedure details: Patient was observed during the procedure. Post-procedure instructions were reviewed.  Patient left the clinic in stable condition.   Clinical History: MRI CERVICAL SPINE WITHOUT CONTRAST  Alignment: Straightening of the expected cervical lordosis. No significant spondylolisthesis.   Vertebrae: Vertebral body height is maintained. 18 mm T2 STIR hyperintense and T1 hypointense lesion within the T1 vertebral body. Nonspecific  edema signal within the spinous processes at C6 and C7.   Cord: No signal abnormality identified within the cervical spinal cord.   Posterior Fossa,  vertebral arteries, paraspinal tissues: No abnormality identified within included portions of the posterior fossa. Flow voids preserved within the imaged cervical vertebral arteries. Heterogeneous appearance of the visualized parotid glands with suggestion of numerous small cysts within the glands. No paraspinal mass or collection. Edema signal within the interspinous spaces at C5-C6, C6-C7, C7-T1 and T1-T2.   Multilevel disc degeneration, greatest at C4-C5 (moderate to moderately advanced), C5-C6 (moderate to moderately advanced) and C6-C7 (moderate).   C2-C3: No significant disc herniation or spinal canal stenosis. Uncovertebral hypertrophy on the right. Facet arthrosis. Mild right neural foraminal narrowing.   C3-C4: No significant disc herniation or spinal canal stenosis. Uncovertebral hypertrophy (greater on the right). Facet arthrosis. Bilateral neural foraminal narrowing (moderate right, mild left).   C4-C5: Posterior disc osteophyte complex with bilateral uncovertebral hypertrophy. Mild facet arthrosis. Mild effacement of the ventral thecal sac (without significant spinal cord mass effect). Mild-to-moderate bilateral neural foraminal narrowing.   C5-C6: Shallow disc bulge. Uncovertebral hypertrophy (greater on the left). Facet arthrosis. No significant spinal canal stenosis. Mild left neural foraminal narrowing.   C6-C7: Disc bulge. Uncovertebral per trapezii (greater on the left). No significant spinal canal stenosis. Mild-to-moderate left neural foraminal narrowing.   C7-T1: This level is imaged in the sagittal plane only. No significant disc herniation or stenosis.   Impressions #2 and #3 will be called to the ordering clinician or representative by the Radiologist Assistant, and communication documented in the PACS or [employer].   IMPRESSION: Indeterminate 18 mm T2 STIR hyperintense and T1 hypointense lesion within the T1 vertebral body. While this may  reflect an atypical hemangioma, alternative etiologies (including osseous metastatic disease) cannot be excluded. Short-interval 2-3 month MRI follow-up without and with contrast is recommended to monitor this finding.   Marrow edema within the C6 and C7 spinous processes. Edema signal also present within the interspinous ligaments at C5-C6, C6-C7, C7-T1 and T1-T2. Findings are nonspecific, but possibly degenerative or posttraumatic in etiology. If there has been recent trauma, the interspinous ligament edema could reflect interspinous ligament injury and a cervical spine CT should be considered to exclude C6/C7 spinous process fractures. Additionally, attention to these sites recommended at MRI imaging follow-up.   Cervical spondylosis, as outlined. No more than mild relative spinal canal narrowing. Multilevel foraminal stenosis, as detailed and greatest on the right at C3-C4 (moderate), bilaterally at C4-C5 (mild-to-moderate) and on the left at C6-C7 (mild-to-moderate).   Nonspecific straightening of the expected cervical lordosis.  On: 10/24/2021 08:09 --------------------------------------- ADDENDUM: Finding omitted from impression: Heterogeneous appearance of the visualized parotid glands with suggestion of numerous small cysts within the glands. These findings may be seen in the setting of Sjogren syndrome or HIV-associated salivary gland disease.   These results will be called to the ordering clinician or representative by the Radiologist Assistant, and communication documented in the PACS or [employer].  On: 11/03/2021 19:40     Objective:  VS:  HT:    WT:   BMI:     BP:(!) 148/97  HR:(!) 108bpm  TEMP: ( )  RESP:  Physical Exam Vitals and nursing note reviewed.  Constitutional:      General: He is not in acute distress.    Appearance: Normal appearance. He is not ill-appearing.  HENT:     Head: Normocephalic and atraumatic.     Right Ear: External  ear  normal.     Left Ear: External ear normal.  Eyes:     Extraocular Movements: Extraocular movements intact.  Cardiovascular:     Rate and Rhythm: Normal rate.     Pulses: Normal pulses.  Abdominal:     General: There is no distension.     Palpations: Abdomen is soft.  Musculoskeletal:        General: No signs of injury.     Cervical back: Neck supple. Tenderness present. No rigidity.     Right lower leg: No edema.     Left lower leg: No edema.     Comments: Patient has good strength in the upper extremities with 5 out of 5 strength in wrist extension long finger flexion APB.  No intrinsic hand muscle atrophy.  Negative Hoffmann's test.  Lymphadenopathy:     Cervical: No cervical adenopathy.  Skin:    Findings: No erythema or rash.  Neurological:     General: No focal deficit present.     Mental Status: He is alert and oriented to person, place, and time.     Sensory: No sensory deficit.     Motor: No weakness or abnormal muscle tone.     Coordination: Coordination normal.  Psychiatric:        Mood and Affect: Mood normal.        Behavior: Behavior normal.      Imaging: XR C-ARM NO REPORT  Result Date: 11/24/2021 Please see Notes tab for imaging impression.

## 2021-11-24 NOTE — Progress Notes (Signed)
Pt state neck pain that travels down his right shoulder, arm and hand. Pt state he has weakness with his right shoulder and hand. Pt state it hard for him to lift and hold items. Pt state he takes over the counter meds to help ease his pain and weakness.  Numeric Pain Rating Scale and Functional Assessment Average Pain 7   In the last MONTH (on 0-10 scale) has pain interfered with the following?  1. General activity like being  able to carry out your everyday physical activities such as walking, climbing stairs, carrying groceries, or moving a chair?  Rating(10)   +Driver, -BT, -Dye Allergies.

## 2021-11-28 ENCOUNTER — Other Ambulatory Visit: Payer: Self-pay | Admitting: Medical

## 2021-11-28 ENCOUNTER — Telehealth: Payer: Self-pay | Admitting: Medical

## 2021-11-28 MED ORDER — FLUTICASONE-SALMETEROL 115-21 MCG/ACT IN AERO: 2.0000 | INHALATION_SPRAY | Freq: Two times a day (BID) | RESPIRATORY_TRACT | 1 refills | Status: DC

## 2021-11-28 MED ORDER — HYDROCODONE BIT-HOMATROP MBR 5-1.5 MG/5ML PO SOLN: 5.0000 mL | Freq: Three times a day (TID) | ORAL | 0 refills | Status: AC | PRN

## 2021-11-28 NOTE — Telephone Encounter (Signed)
Called pt and left a vm to let him know. (When I spoke with him earlier he let me know if he did not answer I could leave the vm.)

## 2021-11-28 NOTE — Telephone Encounter (Signed)
Devin Erickson called in and wanted to let you know he got his shot in his back this past Monday that y'all discussed at his last appt and he's wondering if you could now prescribe something for his cough he still has.

## 2021-12-02 ENCOUNTER — Encounter: Payer: 59 | Admitting: Physical Medicine and Rehabilitation

## 2021-12-25 ENCOUNTER — Ambulatory Visit (INDEPENDENT_AMBULATORY_CARE_PROVIDER_SITE_OTHER): Payer: 59 | Admitting: Physician Assistant

## 2021-12-25 ENCOUNTER — Encounter: Payer: Self-pay | Admitting: Physician Assistant

## 2021-12-25 DIAGNOSIS — M5412 Radiculopathy, cervical region: Secondary | ICD-10-CM

## 2021-12-25 MED ORDER — MELOXICAM 7.5 MG PO TABS: 7.5000 mg | ORAL_TABLET | Freq: Every day | ORAL | 0 refills | Status: DC

## 2021-12-25 NOTE — Progress Notes (Signed)
Office Visit Note   Patient: Devin Erickson           Date of Birth: 29-Apr-1970           MRN: [id number] Visit Date: 12/25/2021              Requested by: Jac Canavan, PA-C [address] [place],  [place] [address] PCP: Jac Canavan, PA-C  Chief Complaint  Patient presents with   Right Shoulder - Follow-up      HPI: Patient presents today complaining of ongoing right shoulder neck and numbness and pain radiating into the first 3 fingers.  This is especially in the index finger.  He has been seen by Dr. Alvester Morin and Ellin Goodie.  2 weeks ago he did have a right C7-T1 steroid injection.  He said he got fairly good relief in all of his symptoms for about a week but now is returned.  He was due to have electrodiagnostic studies of his right upper extremity in early August but he had to change this appointment.  They are scheduled for next week.  He takes Advil twice a day.  Denies any changes other than the symptoms have returned  Assessment & Plan: Visit Diagnoses:  1. Radiculopathy, cervical region     Plan: Encouraged the patient to make sure he goes forward with these electrodiagnostic studies next week.  In the meantime he can stop the Advil and Aleve and will trial low-dose of meloxicam.  Follow-Up Instructions:   Ortho Exam  Patient is alert, oriented, no adenopathy, well-dressed, normal affect, normal respiratory effort. Examination of the right upper extremity he has good strength with resisted abduction extension and flexion of his arms.  Slight decrease in grip strength on the right hand.  He has decreased sensation in the index finger though he has brisk capillary refill and his hand is warm.  Pulses intact.  Imaging: No results found. No images are attached to the encounter.  Labs: Lab Results  Component Value Date   HGBA1C 5.4 07/29/2017   HGBA1C 5.1 07/22/2016   HGBA1C 5.4 02/01/2015   ESRSEDRATE 23 11/18/2021   ESRSEDRATE 10 07/10/2019    ESRSEDRATE 1 03/10/2013   REPTSTATUS 10/25/2012 FINAL 10/23/2012   CULT No Beta Hemolytic Streptococci Isolated 10/23/2012     Lab Results  Component Value Date   ALBUMIN 4.4 02/05/2021   ALBUMIN 4.5 02/16/2019   ALBUMIN 4.4 12/03/2017    No results found for: "MG" Lab Results  Component Value Date   VD25OH 33 07/26/2015   VD25OH 19 (L) 08/01/2014    No results found for: "PREALBUMIN"    Latest Ref Rng & Units 02/05/2021    3:52 PM 07/10/2019    3:49 PM 02/16/2019    2:02 PM  CBC EXTENDED  WBC 3.4 - 10.8 x10E3/uL 5.1  5.8  5.7   RBC 4.14 - 5.80 x10E6/uL 5.41  5.72  5.42   Hemoglobin 13.0 - 17.7 g/dL 72.5  36.6  44.0   HCT 37.5 - 51.0 % 38.8  42.7  41.5   Platelets 150 - 450 x10E3/uL 224  237  185   NEUT# 1.4 - 7.0 x10E3/uL 2.8  2.9    Lymph# 0.7 - 3.1 x10E3/uL 1.2  2.0       There is no height or weight on file to calculate BMI.  Orders:  No orders of the defined types were placed in this encounter.  Meds ordered this encounter  Medications   meloxicam (  MOBIC) 7.5 MG tablet    Sig: Take 1 tablet (7.5 mg total) by mouth daily.    Dispense:  30 tablet    Refill:  0     Procedures: No procedures performed  Clinical Data: No additional findings.  ROS:  All other systems negative, except as noted in the HPI. Review of Systems  Objective: Vital Signs: There were no vitals taken for this visit.  Specialty Comments:  MRI CERVICAL SPINE WITHOUT CONTRAST  Alignment: Straightening of the expected cervical lordosis. No significant spondylolisthesis.   Vertebrae: Vertebral body height is maintained. 18 mm T2 STIR hyperintense and T1 hypointense lesion within the T1 vertebral body. Nonspecific edema signal within the spinous processes at C6 and C7.   Cord: No signal abnormality identified within the cervical spinal cord.   Posterior Fossa, vertebral arteries, paraspinal tissues: No abnormality identified within included portions of the posterior fossa.  Flow voids preserved within the imaged cervical vertebral arteries. Heterogeneous appearance of the visualized parotid glands with suggestion of numerous small cysts within the glands. No paraspinal mass or collection. Edema signal within the interspinous spaces at C5-C6, C6-C7, C7-T1 and T1-T2.   Multilevel disc degeneration, greatest at C4-C5 (moderate to moderately advanced), C5-C6 (moderate to moderately advanced) and C6-C7 (moderate).   C2-C3: No significant disc herniation or spinal canal stenosis. Uncovertebral hypertrophy on the right. Facet arthrosis. Mild right neural foraminal narrowing.   C3-C4: No significant disc herniation or spinal canal stenosis. Uncovertebral hypertrophy (greater on the right). Facet arthrosis. Bilateral neural foraminal narrowing (moderate right, mild left).   C4-C5: Posterior disc osteophyte complex with bilateral uncovertebral hypertrophy. Mild facet arthrosis. Mild effacement of the ventral thecal sac (without significant spinal cord mass effect). Mild-to-moderate bilateral neural foraminal narrowing.   C5-C6: Shallow disc bulge. Uncovertebral hypertrophy (greater on the left). Facet arthrosis. No significant spinal canal stenosis. Mild left neural foraminal narrowing.   C6-C7: Disc bulge. Uncovertebral per trapezii (greater on the left). No significant spinal canal stenosis. Mild-to-moderate left neural foraminal narrowing.   C7-T1: This level is imaged in the sagittal plane only. No significant disc herniation or stenosis.   Impressions #2 and #3 will be called to the ordering clinician or representative by the Radiologist Assistant, and communication documented in the PACS or [employer].   IMPRESSION: Indeterminate 18 mm T2 STIR hyperintense and T1 hypointense lesion within the T1 vertebral body. While this may reflect an atypical hemangioma, alternative etiologies (including osseous metastatic disease) cannot be excluded.  Short-interval 2-3 month MRI follow-up without and with contrast is recommended to monitor this finding.   Marrow edema within the C6 and C7 spinous processes. Edema signal also present within the interspinous ligaments at C5-C6, C6-C7, C7-T1 and T1-T2. Findings are nonspecific, but possibly degenerative or posttraumatic in etiology. If there has been recent trauma, the interspinous ligament edema could reflect interspinous ligament injury and a cervical spine CT should be considered to exclude C6/C7 spinous process fractures. Additionally, attention to these sites recommended at MRI imaging follow-up.   Cervical spondylosis, as outlined. No more than mild relative spinal canal narrowing. Multilevel foraminal stenosis, as detailed and greatest on the right at C3-C4 (moderate), bilaterally at C4-C5 (mild-to-moderate) and on the left at C6-C7 (mild-to-moderate).   Nonspecific straightening of the expected cervical lordosis.  On: 10/24/2021 08:09 --------------------------------------- ADDENDUM: Finding omitted from impression: Heterogeneous appearance of the visualized parotid glands with suggestion of numerous small cysts within the glands. These findings may be seen in the setting of Sjogren syndrome  or HIV-associated salivary gland disease.   These results will be called to the ordering clinician or representative by the Radiologist Assistant, and communication documented in the PACS or [employer].  On: 11/03/2021 19:40  PMFS History: Patient Active Problem List   Diagnosis Date Noted   Radiculopathy, cervical region 10/15/2021   Left arm pain 06/18/2021   Upper back pain 06/18/2021   Decreased ROM of neck 06/18/2021   Screening for heart disease 02/05/2021   Screening for prostate cancer 02/05/2021   Sleep disturbance 02/05/2021   Chronic right-sided thoracic back pain 05/08/2020   Fatigue 07/10/2019   Raynaud's phenomenon without gangrene 07/10/2019   Erectile  dysfunction 07/10/2019   At high risk for bleeding 07/10/2019   ANA positive 07/10/2019   Elevated liver function tests 03/08/2019   Need for influenza vaccination 02/16/2019   Vaccine counseling 02/16/2019   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   Chronic left shoulder pain 05/02/2018   Benign paroxysmal positional vertigo due to bilateral vestibular disorder 06/24/2017   Encounter for health maintenance examination in adult 07/22/2016   Chronic pain of right knee 07/22/2016   Atopic dermatitis 12/12/2015   Tension headache, chronic 10/04/2015   Cough variant asthma vs UACS 09/04/2015   Epistaxis 08/01/2014   Rhinitis, allergic 08/01/2014   Hyperlipidemia 08/01/2014   OSA (obstructive sleep apnea) 08/01/2014   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   History of sleep apnea 04/2012   per overnight screen/oximetry    Obesity    Thalassemia minor    Wears glasses    Wears glasses     Family History  Problem Relation Age of Onset   Cancer Mother 67       colon   Hypertension Mother    Dementia Father    Cancer Father        pancreas   Heart disease Neg Hx    Lung disease Neg Hx     Past Surgical History:  Procedure Laterality Date   COLONOSCOPY  04/27/2010   [facility], normal per pt   TONSILLECTOMY     TONSILLECTOMY AND ADENOIDECTOMY     age 22yo   Social History   Occupational History   Not on file  Tobacco Use   Smoking status: Never   Smokeless tobacco: Never  Substance and Sexual Activity   Alcohol use: No    Comment: few beers a week.maybe 3-4   Drug use: No   Sexual activity: Not on file

## 2021-12-30 ENCOUNTER — Encounter: Payer: 59 | Admitting: Physical Medicine and Rehabilitation

## 2021-12-31 ENCOUNTER — Encounter: Payer: Self-pay | Admitting: Internal Medicine

## 2022-01-08 ENCOUNTER — Ambulatory Visit (INDEPENDENT_AMBULATORY_CARE_PROVIDER_SITE_OTHER): Payer: 59 | Admitting: Physical Medicine and Rehabilitation

## 2022-01-08 DIAGNOSIS — R202 Paresthesia of skin: Secondary | ICD-10-CM

## 2022-01-08 DIAGNOSIS — M5412 Radiculopathy, cervical region: Secondary | ICD-10-CM

## 2022-01-08 DIAGNOSIS — R531 Weakness: Secondary | ICD-10-CM

## 2022-01-18 NOTE — Procedures (Signed)
EMG & NCV Findings: Evaluation of the right median motor nerve showed prolonged distal onset latency (5.0 ms) and decreased conduction velocity (Elbow-Wrist, 40 m/s).  The right ulnar motor nerve showed reduced amplitude (1.1 mV), decreased conduction velocity (B Elbow-Wrist, 35 m/s), and decreased conduction velocity (A Elbow-B Elbow, 23 m/s).  The right median (across palm) sensory nerve showed prolonged distal peak latency (Wrist, 4.9 ms) and prolonged distal peak latency (Palm, 2.9 ms).  The right ulnar sensory nerve showed prolonged distal peak latency (4.5 ms), reduced amplitude (12.1 V), and decreased conduction velocity (Wrist-5th Digit, 31 m/s).  All remaining nerves (as indicated in the following tables) were within normal limits.    Needle evaluation of the right first dorsal interosseous muscle showed increased insertional activity, widespread spontaneous activity, very increased polyphasic potentials, and diminished recruitment.  The right abductor pollicis brevis and the right Ext Digitorum muscles showed increased insertional activity, widespread spontaneous activity, and diminished recruitment.  The right biceps muscle showed increased insertional activity and moderately increased spontaneous activity.  All remaining muscles (as indicated in the following table) showed no evidence of electrical instability.    Impression: The above electrodiagnostic study is ABNORMAL but is difficult to fully interpret.  The needle EMG shows significant denervation but active motor unit potentials in multiple muscles which could be consistent with cervical stenosis but the MRI does not show any cervical stenosis.  This could be a brachial plexus type lesion although it does not seem to fit to a specific trauma although it is closest to an upper trunk brachial plexus lesion.  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   However the patient seems to have a concomitant median neuropathy at the wrist but also  slowing of the ulnar nerve distal to the elbow and this would represent more of a demyelination type lesion on top of what was seen with the denervation potentials.   Recommendations: 1.  Follow-up with referring physician. 2.  Suggest referral to a neurologist for further work-up and/or consideration of brachial plexus MRI.  ___________________________ Laurence Spates [facility] Board Certified, American Board of Physical Medicine and Rehabilitation    Nerve Conduction Studies Anti Sensory Summary Table   Stim Site NR Peak (ms) Norm Peak (ms) P-T Amp (V) Norm P-T Amp Site1 Site2 Delta-P (ms) Dist (cm) Vel (m/s) Norm Vel (m/s)  Right Median Acr Palm Anti Sensory (2nd Digit)  31.2C  Wrist    *4.9 <3.6 10.4 >10 Wrist Palm 2.0 0.0    Palm    *2.9 <2.0 3.2         Right Radial Anti Sensory (Base 1st Digit)  30.8C  Wrist    2.5 <3.1 20.5  Wrist Base 1st Digit 2.5 0.0    Right Ulnar Anti Sensory (5th Digit)  31.3C  Wrist    *4.5 <3.7 *12.1 >15.0 Wrist 5th Digit 4.5 14.0 *31 >38   Motor Summary Table   Stim Site NR Onset (ms) Norm Onset (ms) O-P Amp (mV) Norm O-P Amp Site1 Site2 Delta-0 (ms) Dist (cm) Vel (m/s) Norm Vel (m/s)  Right Median Motor (Abd Poll Brev)  31.1C  Wrist    *5.0 <4.2 5.1 >5 Elbow Wrist 7.3 29.0 *40 >50  Elbow    12.3  4.7         Right Ulnar Motor (Abd Dig Min)  30.9C  Wrist    4.2 <4.2 *1.1 >3 B Elbow Wrist 8.1 28.5 *35 >53  B Elbow    12.3  1.0  A  Elbow B Elbow 4.7 11.0 *23 >53  A Elbow    17.0  1.1          EMG   Side Muscle Nerve Root Ins Act Fibs Psw Amp Dur Poly Recrt Int Dennie Bible Comment  Right 1stDorInt Ulnar C8-T1 *Incr *4+ *4+ Nml Nml *3+ *Reduced Nml   Right Abd Poll Brev Median C8-T1 *Incr *4+ *4+ Nml Nml 0 *Reduced Nml   Right Triceps Radial C6-7-8 Nml Nml Nml Nml Nml 0 Nml Nml   Right Deltoid Axillary C5-6 Nml Nml Nml Nml Nml 0 Nml Nml   Right Biceps Musculocut C5-6 *Incr *2+ *2+ Nml Nml 0 Nml Nml   Right Ext Digitorum  Radial (Post Int) C7-8 *Incr *4+  *4+ Nml Nml 0 *Reduced Nml     Nerve Conduction Studies Anti Sensory Left/Right Comparison   Stim Site L Lat (ms) R Lat (ms) L-R Lat (ms) L Amp (V) R Amp (V) L-R Amp (%) Site1 Site2 L Vel (m/s) R Vel (m/s) L-R Vel (m/s)  Median Acr Palm Anti Sensory (2nd Digit)  31.2C  Wrist  *4.9   10.4  Wrist Palm     Palm  *2.9   3.2        Radial Anti Sensory (Base 1st Digit)  30.8C  Wrist  2.5   20.5  Wrist Base 1st Digit     Ulnar Anti Sensory (5th Digit)  31.3C  Wrist  *4.5   *12.1  Wrist 5th Digit  *31    Motor Left/Right Comparison   Stim Site L Lat (ms) R Lat (ms) L-R Lat (ms) L Amp (mV) R Amp (mV) L-R Amp (%) Site1 Site2 L Vel (m/s) R Vel (m/s) L-R Vel (m/s)  Median Motor (Abd Poll Brev)  31.1C  Wrist  *5.0   5.1  Elbow Wrist  *40   Elbow  12.3   4.7        Ulnar Motor (Abd Dig Min)  30.9C  Wrist  4.2   *1.1  B Elbow Wrist  *35   B Elbow  12.3   1.0  A Elbow B Elbow  *23   A Elbow  17.0   1.1           Waveforms:

## 2022-01-18 NOTE — Progress Notes (Signed)
Devin Erickson - 51 y.o. male MRN [id number]  Date of birth: March 16, 1971  Office Visit Note: Visit Date: 01/08/2022 PCP: Jac Canavan, PA-C Referred by: Valeria Batman, MD  Subjective: Chief Complaint  Patient presents with   Neck - Pain   Right Shoulder - Pain, Weakness, Numbness   Right Arm - Pain, Weakness, Numbness   Right Hand - Weakness, Numbness   HPI:  Devin Erickson is a 51 y.o. male who comes in today at the request of Dr. Norlene Campbell and [place] Persons, PA-C for electrodiagnostic study of the Right upper extremities.  Patient is Right hand dominant.  He was originally seen by Dr. Cleophas Dunker in the latter part of June with a 3-week history of acute onset of neck and shoulder pain with weakness in the right upper extremity.  She reported more concerning ongoing weakness at subsequent appointment.  MRI of the cervical spine is obtained and reviewed and is reviewed below.  While he does have some interesting findings noted on the MRI with perhaps parotid gland nodules as well as atypical hemangioma there were no frank nerve compression or central stenosis.  He underwent cervical epidural injection that diagnostically may have helped for a short while.  This did not provide much long-lasting relief.  Weakness has been more in the shoulder abduction and biceps flexion and paresthesia more C6 distribution.  No history of electrodiagnostic study.  No history of cervical surgery.   ROS Otherwise per HPI.  Assessment & Plan: Visit Diagnoses:    ICD-10-CM   1. Paresthesia of skin  R20.2 NCV with EMG (electromyography)    2. Cervical radiculopathy  M54.12     3. Weakness  R53.1       Plan: Impression: The above electrodiagnostic study is ABNORMAL but is difficult to fully interpret.  The needle EMG shows significant denervation but active motor unit potentials in multiple muscles which could be consistent with cervical stenosis but the MRI does not show any  cervical stenosis.  This could be a brachial plexus type lesion although it does not seem to fit to a specific trauma although it is closest to an upper trunk brachial plexus lesion.  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   However the patient seems to have a concomitant median neuropathy at the wrist but also slowing of the ulnar nerve distal to the elbow and this would represent more of a demyelination type lesion on top of what was seen with the denervation potentials.   Recommendations: 1.  Follow-up with referring physician. 2.  Suggest referral to a neurologist for further work-up and/or consideration of brachial plexus MRI.  Meds & Orders: No orders of the defined types were placed in this encounter.   Orders Placed This Encounter  Procedures   NCV with EMG (electromyography)    Follow-up: Return in about 2 weeks (around 01/22/2022) for Norlene Campbell, MD.   Procedures: No procedures performed  EMG & NCV Findings: Evaluation of the right median motor nerve showed prolonged distal onset latency (5.0 ms) and decreased conduction velocity (Elbow-Wrist, 40 m/s).  The right ulnar motor nerve showed reduced amplitude (1.1 mV), decreased conduction velocity (B Elbow-Wrist, 35 m/s), and decreased conduction velocity (A Elbow-B Elbow, 23 m/s).  The right median (across palm) sensory nerve showed prolonged distal peak latency (Wrist, 4.9 ms) and prolonged distal peak latency (Palm, 2.9 ms).  The right ulnar sensory nerve showed prolonged distal peak latency (4.5 ms), reduced amplitude (12.1 V), and decreased conduction  velocity (Wrist-5th Digit, 31 m/s).  All remaining nerves (as indicated in the following tables) were within normal limits.    Needle evaluation of the right first dorsal interosseous muscle showed increased insertional activity, widespread spontaneous activity, very increased polyphasic potentials, and diminished recruitment.  The right abductor pollicis brevis and the right  Ext Digitorum muscles showed increased insertional activity, widespread spontaneous activity, and diminished recruitment.  The right biceps muscle showed increased insertional activity and moderately increased spontaneous activity.  All remaining muscles (as indicated in the following table) showed no evidence of electrical instability.    Impression: The above electrodiagnostic study is ABNORMAL but is difficult to fully interpret.  The needle EMG shows significant denervation but active motor unit potentials in multiple muscles which could be consistent with cervical stenosis but the MRI does not show any cervical stenosis.  This could be a brachial plexus type lesion although it does not seem to fit to a specific trauma although it is closest to an upper trunk brachial plexus lesion.  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   However the patient seems to have a concomitant median neuropathy at the wrist but also slowing of the ulnar nerve distal to the elbow and this would represent more of a demyelination type lesion on top of what was seen with the denervation potentials.   Recommendations: 1.  Follow-up with referring physician. 2.  Suggest referral to a neurologist for further work-up and/or consideration of brachial plexus MRI.  ___________________________ Naaman Plummer [facility] Board Certified, American Board of Physical Medicine and Rehabilitation    Nerve Conduction Studies Anti Sensory Summary Table   Stim Site NR Peak (ms) Norm Peak (ms) P-T Amp (V) Norm P-T Amp Site1 Site2 Delta-P (ms) Dist (cm) Vel (m/s) Norm Vel (m/s)  Right Median Acr Palm Anti Sensory (2nd Digit)  31.2C  Wrist    *4.9 <3.6 10.4 >10 Wrist Palm 2.0 0.0    Palm    *2.9 <2.0 3.2         Right Radial Anti Sensory (Base 1st Digit)  30.8C  Wrist    2.5 <3.1 20.5  Wrist Base 1st Digit 2.5 0.0    Right Ulnar Anti Sensory (5th Digit)  31.3C  Wrist    *4.5 <3.7 *12.1 >15.0 Wrist 5th Digit 4.5 14.0 *31 >38    Motor Summary Table   Stim Site NR Onset (ms) Norm Onset (ms) O-P Amp (mV) Norm O-P Amp Site1 Site2 Delta-0 (ms) Dist (cm) Vel (m/s) Norm Vel (m/s)  Right Median Motor (Abd Poll Brev)  31.1C  Wrist    *5.0 <4.2 5.1 >5 Elbow Wrist 7.3 29.0 *40 >50  Elbow    12.3  4.7         Right Ulnar Motor (Abd Dig Min)  30.9C  Wrist    4.2 <4.2 *1.1 >3 B Elbow Wrist 8.1 28.5 *35 >53  B Elbow    12.3  1.0  A Elbow B Elbow 4.7 11.0 *23 >53  A Elbow    17.0  1.1          EMG   Side Muscle Nerve Root Ins Act Fibs Psw Amp Dur Poly Recrt Int Dennie Bible Comment  Right 1stDorInt Ulnar C8-T1 *Incr *4+ *4+ Nml Nml *3+ *Reduced Nml   Right Abd Poll Brev Median C8-T1 *Incr *4+ *4+ Nml Nml 0 *Reduced Nml   Right Triceps Radial C6-7-8 Nml Nml Nml Nml Nml 0 Nml Nml   Right Deltoid Axillary C5-6 Nml Nml Nml  Nml Nml 0 Nml Nml   Right Biceps Musculocut C5-6 *Incr *2+ *2+ Nml Nml 0 Nml Nml   Right Ext Digitorum  Radial (Post Int) C7-8 *Incr *4+ *4+ Nml Nml 0 *Reduced Nml     Nerve Conduction Studies Anti Sensory Left/Right Comparison   Stim Site L Lat (ms) R Lat (ms) L-R Lat (ms) L Amp (V) R Amp (V) L-R Amp (%) Site1 Site2 L Vel (m/s) R Vel (m/s) L-R Vel (m/s)  Median Acr Palm Anti Sensory (2nd Digit)  31.2C  Wrist  *4.9   10.4  Wrist Palm     Palm  *2.9   3.2        Radial Anti Sensory (Base 1st Digit)  30.8C  Wrist  2.5   20.5  Wrist Base 1st Digit     Ulnar Anti Sensory (5th Digit)  31.3C  Wrist  *4.5   *12.1  Wrist 5th Digit  *31    Motor Left/Right Comparison   Stim Site L Lat (ms) R Lat (ms) L-R Lat (ms) L Amp (mV) R Amp (mV) L-R Amp (%) Site1 Site2 L Vel (m/s) R Vel (m/s) L-R Vel (m/s)  Median Motor (Abd Poll Brev)  31.1C  Wrist  *5.0   5.1  Elbow Wrist  *40   Elbow  12.3   4.7        Ulnar Motor (Abd Dig Min)  30.9C  Wrist  4.2   *1.1  B Elbow Wrist  *35   B Elbow  12.3   1.0  A Elbow B Elbow  *23   A Elbow  17.0   1.1           Waveforms:             Clinical History: MRI  CERVICAL SPINE WITHOUT CONTRAST  Alignment: Straightening of the expected cervical lordosis. No significant spondylolisthesis.   Vertebrae: Vertebral body height is maintained. 18 mm T2 STIR hyperintense and T1 hypointense lesion within the T1 vertebral body. Nonspecific edema signal within the spinous processes at C6 and C7.   Cord: No signal abnormality identified within the cervical spinal cord.   Posterior Fossa, vertebral arteries, paraspinal tissues: No abnormality identified within included portions of the posterior fossa. Flow voids preserved within the imaged cervical vertebral arteries. Heterogeneous appearance of the visualized parotid glands with suggestion of numerous small cysts within the glands. No paraspinal mass or collection. Edema signal within the interspinous spaces at C5-C6, C6-C7, C7-T1 and T1-T2.   Multilevel disc degeneration, greatest at C4-C5 (moderate to moderately advanced), C5-C6 (moderate to moderately advanced) and C6-C7 (moderate).   C2-C3: No significant disc herniation or spinal canal stenosis. Uncovertebral hypertrophy on the right. Facet arthrosis. Mild right neural foraminal narrowing.   C3-C4: No significant disc herniation or spinal canal stenosis. Uncovertebral hypertrophy (greater on the right). Facet arthrosis. Bilateral neural foraminal narrowing (moderate right, mild left).   C4-C5: Posterior disc osteophyte complex with bilateral uncovertebral hypertrophy. Mild facet arthrosis. Mild effacement of the ventral thecal sac (without significant spinal cord mass effect). Mild-to-moderate bilateral neural foraminal narrowing.   C5-C6: Shallow disc bulge. Uncovertebral hypertrophy (greater on the left). Facet arthrosis. No significant spinal canal stenosis. Mild left neural foraminal narrowing.   C6-C7: Disc bulge. Uncovertebral per trapezii (greater on the left). No significant spinal canal stenosis. Mild-to-moderate left  neural foraminal narrowing.   C7-T1: This level is imaged in the sagittal plane only. No significant disc herniation or stenosis.   Impressions #2 and #  3 will be called to the ordering clinician or representative by the Radiologist Assistant, and communication documented in the PACS or [employer].   IMPRESSION: Indeterminate 18 mm T2 STIR hyperintense and T1 hypointense lesion within the T1 vertebral body. While this may reflect an atypical hemangioma, alternative etiologies (including osseous metastatic disease) cannot be excluded. Short-interval 2-3 month MRI follow-up without and with contrast is recommended to monitor this finding.   Marrow edema within the C6 and C7 spinous processes. Edema signal also present within the interspinous ligaments at C5-C6, C6-C7, C7-T1 and T1-T2. Findings are nonspecific, but possibly degenerative or posttraumatic in etiology. If there has been recent trauma, the interspinous ligament edema could reflect interspinous ligament injury and a cervical spine CT should be considered to exclude C6/C7 spinous process fractures. Additionally, attention to these sites recommended at MRI imaging follow-up.   Cervical spondylosis, as outlined. No more than mild relative spinal canal narrowing. Multilevel foraminal stenosis, as detailed and greatest on the right at C3-C4 (moderate), bilaterally at C4-C5 (mild-to-moderate) and on the left at C6-C7 (mild-to-moderate).   Nonspecific straightening of the expected cervical lordosis.  On: 10/24/2021 08:09 --------------------------------------- ADDENDUM: Finding omitted from impression: Heterogeneous appearance of the visualized parotid glands with suggestion of numerous small cysts within the glands. These findings may be seen in the setting of Sjogren syndrome or HIV-associated salivary gland disease.   These results will be called to the ordering clinician or representative by the Radiologist  Assistant, and communication documented in the PACS or [employer].  On: 11/03/2021 19:40     Objective:  VS:  HT:    WT:   BMI:     BP:   HR: bpm  TEMP: ( )  RESP:  Physical Exam Musculoskeletal:        General: No tenderness.     Comments: Inspection reveals no atrophy of the bilateral APB or FDI or hand intrinsics. There is no swelling, color changes, allodynia or dystrophic changes. There is 4 out of 5 strength with bicep flexion and shoulder abduction on the right but otherwise 5 out of 5 strength in the bilateral wrist extension, finger abduction and long finger flexion. There is intact sensation to light touch in all dermatomal and peripheral nerve distributions. There is a negative Phalen's test bilaterally. There is a negative Hoffmann's test bilaterally.  Skin:    General: Skin is warm and dry.     Findings: No erythema or rash.  Neurological:     General: No focal deficit present.     Mental Status: He is alert and oriented to person, place, and time.     Sensory: No sensory deficit.     Motor: No weakness or abnormal muscle tone.     Coordination: Coordination normal.     Gait: Gait normal.  Psychiatric:        Mood and Affect: Mood normal.        Behavior: Behavior normal.        Thought Content: Thought content normal.      Imaging: No results found.

## 2022-01-20 ENCOUNTER — Encounter: Payer: Self-pay | Admitting: Orthopaedic Surgery

## 2022-01-20 ENCOUNTER — Other Ambulatory Visit: Payer: Self-pay | Admitting: Medical

## 2022-01-20 ENCOUNTER — Ambulatory Visit (INDEPENDENT_AMBULATORY_CARE_PROVIDER_SITE_OTHER): Payer: 59 | Admitting: Orthopaedic Surgery

## 2022-01-20 DIAGNOSIS — G5691 Unspecified mononeuropathy of right upper limb: Secondary | ICD-10-CM

## 2022-01-20 DIAGNOSIS — M5412 Radiculopathy, cervical region: Secondary | ICD-10-CM

## 2022-01-20 MED ORDER — MELOXICAM 7.5 MG PO TABS: 7.5000 mg | ORAL_TABLET | Freq: Every day | ORAL | 0 refills | Status: DC

## 2022-01-20 NOTE — Progress Notes (Signed)
Office Visit Note   Patient: Devin Erickson           Date of Birth: Apr 05, 1971           MRN: [id number] Visit Date: 01/20/2022              Requested by: Carlena Hurl, PA-C [address] [place],  [place] [id number] PCP: Carlena Hurl, PA-C   Assessment & Plan: Visit Diagnoses:  1. Cervical radiculopathy   2. Radiculopathy, cervical region   3. Neuropathy, arm, right     Plan: Mr. Raburn presents in follow-up today to review the EMG studies of his right upper extremity.  He initially saw [place] for cervical radiculopathy.  He underwent a C7-T1 injection however this only gave him brief relief.  He continued to have pain and numbness and what he describes as stiffness going down his right arm.  He also has weakness when trying to use his hand for writing and other fine dexterity.  Because of this the EMG study was ordered and he is here to review it today.  The EMG study demonstrated significant denervation but active motor unit potentials in multiple muscles which would be consistent with cervical stenosis but he did have an MRI that did not show this.  The thought was that this could be a brachial plexus type lesion although it does not seem to fit a specific trauma it is closest to an upper trunk brachial plexus lesion thought that this could be Parson[place]-Turner syndrome.  Exam shows altered sensation in all of his fingers he does have some findings consistent with carpal tunnel but also has exam findings that could suggest a brachial plexus problem.  For this reason we are going to refer him to a neurologist to fully evaluate the right upper extremity after these EMG findings.  We will get him with a neurologist as soon as possible.  In the meantime we have gone forward and refilled his meloxicam. 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   Orders:  Orders Placed This Encounter  Procedures   Ambulatory referral to Neurology   Meds ordered this encounter  Medications   meloxicam (MOBIC) 7.5 MG tablet    Sig: Take 1 tablet (7.5 mg total) by mouth daily.    Dispense:  30 tablet    Refill:  0      Procedures: No procedures performed   Clinical Data: No additional findings.   Subjective: Chief Complaint  Patient presents with   Neck - Pain, Follow-up    HPI Patient is a pleasant 51 year old gentleman with right upper extremity weakness paresthesias and feelings of stiffness.  This was originally thought to be from his cervical radiculopathy but a C7-T1 injection was only temporarily helpful.  EMGs and nerve conduction studies were performed by Dr. Ernestina Patches and is here for follow-up evaluation and to review the study Review of Systems   Objective: Vital Signs: There were no vitals taken for this visit.  Physical Exam Constitutional:      Appearance: Normal appearance.  Pulmonary:     Effort: Pulmonary effort is normal.  Skin:    General: Skin is warm and dry.  Neurological:     Mental Status: He is alert.     Ortho Exam awake alert and oriented x3.  Comfortable sitting.  Minimally positive Tinel's over the median nerve at the wrist.  Phalen's was also minimally positive with some discomfort into his little finger and the index finger.  I thought he had some atrophy of the thenar musculature on the right compared to the left.  Also has some early clawing consistent with a possible ulnar nerve problem but negative pain over the ulnar nerve at the elbow or within Guyon's canal.  Feels like he has some slight decrease sensibility in the tips of all of his fingers.  Good capillary refill.  Negative Tinel's over the radial nerve at the elbow or the ulnar nerve in its groove.  Able to make a good grip and release.  Specialty Comments:  MRI CERVICAL  SPINE WITHOUT CONTRAST  Alignment: Straightening of the expected cervical lordosis. No significant spondylolisthesis.   Vertebrae: Vertebral body height is maintained. 18 mm T2 STIR hyperintense and T1 hypointense lesion within the T1 vertebral body. Nonspecific edema signal within the spinous processes at C6 and C7.   Cord: No signal abnormality identified within the cervical spinal cord.   Posterior Fossa, vertebral arteries, paraspinal tissues: No abnormality identified within included portions of the posterior fossa. Flow voids preserved within the imaged cervical vertebral arteries. Heterogeneous appearance of the visualized parotid glands with suggestion of numerous small cysts within the glands. No paraspinal mass or collection. Edema signal within the interspinous spaces at C5-C6, C6-C7, C7-T1 and T1-T2.   Multilevel disc degeneration, greatest at C4-C5 (moderate to moderately advanced), C5-C6 (moderate to moderately advanced) and C6-C7 (moderate).   C2-C3: No significant disc herniation or spinal canal stenosis. Uncovertebral hypertrophy on the right. Facet arthrosis. Mild right neural foraminal narrowing.   C3-C4: No significant disc herniation or spinal canal stenosis. Uncovertebral hypertrophy (greater on the right). Facet arthrosis. Bilateral neural foraminal narrowing (moderate right, mild left).   C4-C5: Posterior disc osteophyte complex with bilateral uncovertebral hypertrophy. Mild facet arthrosis. Mild effacement of the ventral thecal sac (without significant spinal cord mass effect). Mild-to-moderate bilateral neural foraminal narrowing.   C5-C6: Shallow disc bulge. Uncovertebral hypertrophy (greater on the left). Facet arthrosis. No significant spinal canal stenosis. Mild left neural foraminal narrowing.   C6-C7: Disc bulge. Uncovertebral per trapezii (greater on the left). No significant spinal canal stenosis. Mild-to-moderate left neural foraminal  narrowing.   C7-T1: This level is imaged in the sagittal plane only. No significant disc herniation or stenosis.   Impressions #2 and #3 will be called to the ordering clinician or representative by the Radiologist Assistant, and communication documented in the PACS or [employer].   IMPRESSION: Indeterminate 18 mm T2 STIR hyperintense and T1 hypointense lesion within the T1 vertebral body. While this may reflect an atypical hemangioma, alternative etiologies (including osseous metastatic disease) cannot be excluded. Short-interval 2-3 month MRI follow-up without and with contrast is recommended to monitor this finding.   Marrow edema within the C6 and C7 spinous processes. Edema signal also present within the interspinous ligaments at C5-C6, C6-C7, C7-T1 and T1-T2. Findings are nonspecific, but possibly degenerative or posttraumatic in etiology. If there has been recent trauma, the interspinous ligament edema could reflect interspinous ligament injury and a cervical spine CT should be considered to exclude C6/C7 spinous process fractures. Additionally, attention to these sites recommended at MRI imaging follow-up.   Cervical spondylosis, as outlined. No more than mild relative spinal canal narrowing. Multilevel foraminal stenosis, as detailed and greatest on the right at C3-C4 (moderate), bilaterally at C4-C5 (mild-to-moderate) and on the left at C6-C7 (  mild-to-moderate).   Nonspecific straightening of the expected cervical lordosis.  On: 10/24/2021 08:09 --------------------------------------- ADDENDUM: Finding omitted from impression: Heterogeneous appearance of the visualized parotid glands with suggestion of numerous small cysts within the glands. These findings may be seen in the setting of Sjogren syndrome or HIV-associated salivary gland disease.   These results will be called to the ordering clinician or representative by the Radiologist Assistant, and  communication documented in the PACS or [employer].  On: 11/03/2021 19:40  Imaging: No results found.   PMFS History: Patient Active Problem List   Diagnosis Date Noted   Neuropathy, arm, right 01/20/2022   Radiculopathy, cervical region 10/15/2021   Left arm pain 06/18/2021   Upper back pain 06/18/2021   Decreased ROM of neck 06/18/2021   Screening for heart disease 02/05/2021   Screening for prostate cancer 02/05/2021   Sleep disturbance 02/05/2021   Chronic right-sided thoracic back pain 05/08/2020   Fatigue 07/10/2019   Raynaud's phenomenon without gangrene 07/10/2019   Erectile dysfunction 07/10/2019   At high risk for bleeding 07/10/2019   ANA positive 07/10/2019   Elevated liver function tests 03/08/2019   Need for influenza vaccination 02/16/2019   Vaccine counseling 02/16/2019   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   Chronic left shoulder pain 05/02/2018   Benign paroxysmal positional vertigo due to bilateral vestibular disorder 06/24/2017   Encounter for health maintenance examination in adult 07/22/2016   Chronic pain of right knee 07/22/2016   Atopic dermatitis 12/12/2015   Tension headache, chronic 10/04/2015   Cough variant asthma vs UACS 09/04/2015   Epistaxis 08/01/2014   Rhinitis, allergic 08/01/2014   Hyperlipidemia 08/01/2014   OSA (obstructive sleep apnea) 08/01/2014   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   History of sleep apnea 04/2012   per overnight screen/oximetry    Obesity    Thalassemia minor    Wears glasses    Wears glasses     Family History  Problem Relation Age of Onset   Cancer Mother 67       colon   Hypertension Mother    Dementia Father    Cancer Father        pancreas   Heart disease Neg Hx    Lung disease Neg Hx     Past Surgical History:   Procedure Laterality Date   COLONOSCOPY  04/27/2010   [facility], normal per pt   TONSILLECTOMY     TONSILLECTOMY AND ADENOIDECTOMY     age 5yo   Social History   Occupational History   Not on file  Tobacco Use   Smoking status: Never   Smokeless tobacco: Never  Substance and Sexual Activity   Alcohol use: No    Comment: few beers a week.maybe 3-4   Drug use: No   Sexual activity: Not on file

## 2022-01-21 ENCOUNTER — Encounter: Payer: Self-pay | Admitting: Neurology

## 2022-02-02 ENCOUNTER — Other Ambulatory Visit: Payer: Self-pay | Admitting: Medical

## 2022-02-02 MED ORDER — EMERGEN-C IMMUNE PLUS PO PACK: 1.0000 | PACK | Freq: Two times a day (BID) | ORAL | 0 refills | Status: DC

## 2022-02-18 ENCOUNTER — Encounter: Payer: Self-pay | Admitting: Neurology

## 2022-02-18 ENCOUNTER — Ambulatory Visit (INDEPENDENT_AMBULATORY_CARE_PROVIDER_SITE_OTHER): Payer: 59 | Admitting: Neurology

## 2022-02-18 ENCOUNTER — Other Ambulatory Visit (INDEPENDENT_AMBULATORY_CARE_PROVIDER_SITE_OTHER): Payer: 59

## 2022-02-18 VITALS — BP 135/87 | HR 75 | Ht 75.0 in | Wt 269.0 lb

## 2022-02-18 DIAGNOSIS — G629 Polyneuropathy, unspecified: Secondary | ICD-10-CM | POA: Diagnosis not present

## 2022-02-18 DIAGNOSIS — R29898 Other symptoms and signs involving the musculoskeletal system: Secondary | ICD-10-CM

## 2022-02-18 LAB — TSH: TSH: 1.65 u[IU]/mL (ref 0.35–5.50)

## 2022-02-18 LAB — B12 AND FOLATE PANEL
Folate: >23.8 ng/mL (ref >5.9)
Vitamin B-12: 880 pg/mL (ref 211–911)

## 2022-02-18 LAB — C-REACTIVE PROTEIN: CRP: <1 mg/dL (ref 0.5–20.0)

## 2022-02-18 LAB — SEDIMENTATION RATE: Sed Rate: 16 mm/hr (ref 0–20)

## 2022-02-18 NOTE — Patient Instructions (Signed)
Nerve testing of the arms  Check labs  Refer to occupational therapy  You can online and look up hand assist devices for keys

## 2022-02-18 NOTE — Progress Notes (Unsigned)
[facility] Clinic Note - Initial Visit   Date: 02/18/2022   Devin Erickson MRN: [id number] DOB: 15-Sep-1970   Dear Dr. Durward Fortes:  Thank you for your kind referral of Bolden Hagerman for consultation of right hand weakness. Although his history is well known to you, please allow [place] to reiterate it for the purpose of our medical record. The patient was accompanied to the clinic by self.    Hakim Minniefield is a 51 y.o. right-handed male with hyperlipidemia and anxiety presenting for evaluation of right arm weakness.   IMPRESSION/PLAN: Progressive right hand weakness, paresthesias, and atrophy.  MRI cervical spine does not shows compressive pathology at the C7, C8, or T1 nerve root/segments to explain his current symptoms. Further, NCS/EMG shows diffuse demyelinating and axonal changes with evidence of acute on chronic motor axon loss involving the biceps and intrinsic hand muscles.  Although Jerilee Hoh may be considered, he denies severe pain as would be expected with a brachial plexopathy.  I would like him to return for repeat EDX with additional studies of the brachial plexus and comparison of the left arm.  Testing of the lower extremities may also be indicated based on the findings.  I am concerned about an acute demyelinating polyradiculoneuropathy (CIDP) which can manifesting with subacute weakness/paresthesias.  If EMG does favor demyelinating neuropathy, the next step is CSF testing.  NCS/EMG right > left arm  Check ESR, CRP, ANA, ENA, ANCA, cryoglobulins, copper, SPEP with IFE, vitamin B12, folate, vitamin B1, ACE Referral to out-patient occupational therapy Hand assist devices to help with keys, opening doors, etc. was discussed and he was encouraged to research options online or at medical supply store  Return to clinic in 4 weeks  ------------------------------------------------------------- History of present illness: Starting  around April 2023, he began noticing mild weakness in the arm especially when weight lifting, it was more effortful on the right side.  Over the summer, he noticed weakness in grip on the right hand which continued to progress to the point where he is unable to use his hand to crank his ignition, turning keys to entire his home, grasping objects, and using utensils.  He is dropping things frequently.  He has difficulty straightening the hand.  He denies weakness with raising the arm or bending at the elbow.  He has mild pain in the right shoulder, described as a nagging pain.  He treated it with tylenol.  Occasionally, he has numbness over the tips of the fingers.    He denies similar symptoms in the left hand.  No numbness/tingling in the feet.  He denies any ongoing pain or neck pain.  MRI cervical spine was performed in July which shows cervical spondylosis and multilevel foraminal stenosis at right C3-C4, bilateral C4-5 and left C6-7. Additionally, there was note of edema signal in the C6 and C7 spinous process as well as interspinous ligaments at C5-6, C6-7, C7-T1, and T1-T2.    NCS/EMG performed in September shows diffuse prolonged latency, slowed motor conduction velocity, and reduced amplitude involving the median and ulnar nerves.  Needle EMG shows active fibrillation potentials in the FDI, ABP, EDC, and biceps muscles.  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 Labs indicate positive ANA.   Out-side paper records, electronic medical record, and images have been reviewed where available and summarized as:  NCS/EMG of the  right arm 01/08/2022: EMG & NCV Findings: Evaluation of the right median motor nerve showed prolonged distal onset latency (5.0 ms) and decreased conduction velocity (Elbow-Wrist, 40 m/s).  The right ulnar motor nerve showed reduced  amplitude (1.1 mV), decreased conduction velocity (B Elbow-Wrist, 35 m/s), and decreased conduction velocity (A Elbow-B Elbow, 23 m/s).  The right median (across palm) sensory nerve showed prolonged distal peak latency (Wrist, 4.9 ms) and prolonged distal peak latency (Palm, 2.9 ms).  The right ulnar sensory nerve showed prolonged distal peak latency (4.5 ms), reduced amplitude (12.1 V), and decreased conduction velocity (Wrist-5th Digit, 31 m/s).  All remaining nerves (as indicated in the following tables) were within normal limits.     Needle evaluation of the right first dorsal interosseous muscle showed increased insertional activity, widespread spontaneous activity, very increased polyphasic potentials, and diminished recruitment.  The right abductor pollicis brevis and the right Ext Digitorum muscles showed increased insertional activity, widespread spontaneous activity, and diminished recruitment.  The right biceps muscle showed increased insertional activity and moderately increased spontaneous activity.  All remaining muscles (as indicated in the following table) showed no evidence of electrical instability.     Impression: The above electrodiagnostic study is ABNORMAL but is difficult to fully interpret.  The needle EMG shows significant denervation but active motor unit potentials in multiple muscles which could be consistent with cervical stenosis but the MRI does not show any cervical stenosis.  This could be a brachial plexus type lesion although it does not seem to fit to a specific trauma although it is closest to an upper trunk brachial plexus lesion.  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    However the patient seems to have a concomitant median neuropathy at the wrist but also slowing of the ulnar nerve distal to the elbow and this would represent more of a demyelination type lesion on top of what was seen with the denervation potentials.     MRI cervical spine wo contrast  11/03/21: Intermittently motion degraded exam.   Indeterminate 18 mm T2 STIR hyperintense and T1 hypointense lesion within the T1 vertebral body. While this may reflect an atypical hemangioma, alternative etiologies (including osseous metastatic disease) cannot be excluded. Short-interval 2-3 month MRI follow-up without and with contrast is recommended to monitor this finding.   Marrow edema within the C6 and C7 spinous processes. Edema signal also present within the interspinous ligaments at C5-C6, C6-C7, C7-T1 and T1-T2. Findings are nonspecific, but possibly degenerative or posttraumatic in etiology. If there has been recent trauma, the interspinous ligament edema could reflect interspinous ligament injury and a cervical spine CT should be considered to exclude C6/C7 spinous process fractures. Additionally, attention to these sites recommended at MRI imaging follow-up.   Cervical spondylosis, as outlined. No more than mild relative spinal canal narrowing. Multilevel foraminal stenosis, as detailed and greatest on the right at C3-C4 (moderate), bilaterally at C4-C5 (mild-to-moderate) and on the left at C6-C7 (mild-to-moderate).   Nonspecific straightening of the expected cervical lordosis. 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   Eczema    H/O exercise stress test 2009   [facility] physicians,  normal per pt   Hair loss    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   History of sleep apnea 04/2012   per overnight screen/oximetry    Obesity    Thalassemia minor    Wears glasses    Wears glasses     Past Surgical History:  Procedure Laterality Date   COLONOSCOPY  04/27/2010   [facility] physicians, normal per  pt   TONSILLECTOMY     TONSILLECTOMY AND ADENOIDECTOMY     age 73yo     Medications:  Outpatient Encounter Medications as of 02/18/2022  Medication Sig   albuterol (VENTOLIN HFA) 108 (90 Base) MCG/ACT inhaler Inhale 2 puffs into the lungs every 6 (six) hours as needed for wheezing or shortness of breath.   cetirizine (ZYRTEC) 10 MG tablet Take 10 mg by mouth daily.   meloxicam (MOBIC) 7.5 MG tablet Take 1 tablet (7.5 mg total) by mouth daily.   Multiple Vitamin (MULTIVITAMIN) tablet Take 1 tablet by mouth daily.   rosuvastatin (CRESTOR) 20 MG tablet TAKE 1 TABLET BY MOUTH EVERYDAY AT BEDTIME   [DISCONTINUED] ADVAIR HFA 115-21 MCG/ACT inhaler INHALE 2 PUFFS INTO THE LUNGS TWICE A DAY   [DISCONTINUED] diazepam (VALIUM) 10 MG tablet Take 1 tablet (10 mg total) by mouth at bedtime as needed for anxiety.   [DISCONTINUED] Multiple Vitamins-Minerals (EMERGEN-C IMMUNE PLUS) PACK Take 1 tablet by mouth 2 (two) times daily.   No facility-administered encounter medications on file as of 02/18/2022.    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  Jehoviah Witness.  01/2021         Right Handed    Lives in a one story home     Vital Signs:  BP 135/87   Pulse 75   Ht _0  (1.905 m)   Wt 269 lb (122 kg)   SpO2 95%   BMI 33.62 kg/m   Neurological Exam: MENTAL STATUS including orientation to time, place, person, recent and remote memory,  attention span and concentration, language, and fund of knowledge is normal.  Speech is not dysarthric.  CRANIAL NERVES: II:  No visual field defects.   III-IV-VI: Pupils equal round and reactive to light.  Normal conjugate, extra-ocular eye movements in all directions of gaze.  No nystagmus.  No ptosis.   V:  Normal facial sensation.    VII:  Normal facial symmetry and movements.   VIII:  Normal hearing and vestibular function.   IX-X:  Normal palatal movement.   XI:  Normal shoulder shrug and head rotation.   XII:  Normal tongue strength and range of motion, no deviation or fasciculation.  MOTOR:  Right FDI, ADM, ABP, and medial forearm atrophy.  No fasciculations or abnormal movements.  No pronator drift.   Upper Extremity:  Right  Left  Deltoid  5/5   5/5   Biceps  5-/5   5/5   Triceps  5/5   5/5   Wrist extensors  5/5   5/5   Wrist flexors  5-/5   5/5   Finger extensors  3/5   5/5   Finger flexors  4/5   5/5   Dorsal interossei  3/5   5/5   Abductor pollicis  3/5   5/5   Tone (Ashworth scale)  0  0   Lower Extremity:  Right  Left  Hip flexors  5/5   5/5   Knee flexors  5/5   5/5   Knee extensors  5/5   5/5   Dorsiflexors  5/5   5/5   Plantarflexors  5/5   5/5   Toe extensors  5/5   5/5   Toe flexors  5/5   5/5   Tone (Ashworth scale)  0  0   MSRs:                                           Right        Left brachioradialis tr  1+  biceps tr  1+  triceps tr  1+  patellar 1+  1+  ankle jerk 1+  1+  Hoffman no  no  plantar response down  down   SENSORY:  Reduced pin prick and temperature in the right palm, compared to the left.  Sensation intact in the legs.    COORDINATION/GAIT: Normal finger-to- nose-finger.  Finger tapping slowed on the right due to weakness.   Gait narrow based and stable. Tandem and stressed gait intact.   Total time spent reviewing records, interview, history/exam, documentation, and coordination of care on day of encounter:  60  min    Thank you for allowing me to participate in patient's care.  If I can answer any additional questions, I would be pleased to do so.    Sincerely,    Basel Defalco K. Posey Pronto, DO

## 2022-02-20 ENCOUNTER — Other Ambulatory Visit: Payer: Self-pay | Admitting: Orthopaedic Surgery

## 2022-02-21 LAB — ANA+ENA+DNA/DS+SCL 70+SJOSSA/B
ANA Titer 1: POSITIVE — AB
ENA RNP Ab: 0.3 AI (ref 0.0–0.9)
ENA SM Ab Ser-aCnc: <0.2 AI (ref 0.0–0.9)
ENA SSA (RO) Ab: <0.2 AI (ref 0.0–0.9)
ENA SSB (LA) Ab: <0.2 AI (ref 0.0–0.9)
Scleroderma (Scl-70) (ENA) Antibody, IgG: <0.2 AI (ref 0.0–0.9)
dsDNA Ab: 1 IU/mL (ref 0–9)

## 2022-02-21 LAB — FANA STAINING PATTERNS: CENTROMERE AB: 1:640 {titer} — ABNORMAL HIGH

## 2022-02-23 NOTE — Telephone Encounter (Signed)
Ok to renew?  

## 2022-02-24 LAB — VITAMIN B1: Vitamin B1 (Thiamine): 7 nmol/L — ABNORMAL LOW (ref 8–30)

## 2022-02-24 LAB — PROTEIN ELECTROPHORESIS, SERUM
Albumin ELP: 4.6 g/dL (ref 3.8–4.8)
Alpha 1: 0.2 g/dL (ref 0.2–0.3)
Alpha 2: 0.6 g/dL (ref 0.5–0.9)
Beta 2: 0.3 g/dL (ref 0.2–0.5)
Beta Globulin: 0.4 g/dL (ref 0.4–0.6)
Gamma Globulin: 1.3 g/dL (ref 0.8–1.7)
Total Protein: 7.4 g/dL (ref 6.1–8.1)

## 2022-02-24 LAB — CRYOGLOBULIN: Cryoglobulin, Qualitative Analysis: NOT DETECTED

## 2022-02-24 LAB — ANCA SCREEN W REFLEX TITER: ANCA SCREEN: NEGATIVE

## 2022-02-24 LAB — IMMUNOFIXATION ELECTROPHORESIS
IgG (Immunoglobin G), Serum: 1428 mg/dL (ref 600–1640)
IgM, Serum: 117 mg/dL (ref 50–300)
Immunoglobulin A: 191 mg/dL (ref 47–310)

## 2022-02-24 LAB — COPPER, SERUM: Copper: 110 ug/dL (ref 70–175)

## 2022-02-24 LAB — ANGIOTENSIN CONVERTING ENZYME: Angiotensin-Converting Enzyme: 33 U/L (ref 9–67)

## 2022-02-24 NOTE — Telephone Encounter (Signed)
thanks

## 2022-02-26 ENCOUNTER — Telehealth: Payer: Self-pay | Admitting: Neurology

## 2022-02-26 NOTE — Telephone Encounter (Signed)
Pt called in returning our call. He won't be able to answer our phone call until 4 PM since he is at work.

## 2022-02-27 ENCOUNTER — Other Ambulatory Visit: Payer: Self-pay

## 2022-02-27 DIAGNOSIS — R768 Other specified abnormal immunological findings in serum: Secondary | ICD-10-CM

## 2022-02-27 NOTE — Telephone Encounter (Signed)
Called patient and left a message for a call back. See result notes.

## 2022-02-27 NOTE — Telephone Encounter (Signed)
Patient requesting call back with results.

## 2022-03-24 ENCOUNTER — Other Ambulatory Visit: Payer: Self-pay | Admitting: Orthopaedic Surgery

## 2022-03-24 ENCOUNTER — Ambulatory Visit (INDEPENDENT_AMBULATORY_CARE_PROVIDER_SITE_OTHER): Payer: 59 | Admitting: Neurology

## 2022-03-24 DIAGNOSIS — R29898 Other symptoms and signs involving the musculoskeletal system: Secondary | ICD-10-CM

## 2022-03-24 DIAGNOSIS — G629 Polyneuropathy, unspecified: Secondary | ICD-10-CM | POA: Diagnosis not present

## 2022-03-24 DIAGNOSIS — G610 Guillain-Barre syndrome: Secondary | ICD-10-CM

## 2022-03-24 NOTE — Procedures (Signed)
[facility]  [address] [place], [address]  [place], [place] [address] Tel: [phone] Fax:  [phone] Test Date:  03/24/2022  Patient: Devin Erickson DOB: March 28, 1971 Physician: Nita Sickle, DO  Sex: Male Height: 6\' 3"  Ref Phys: , DO  ID#: Nita Sickle   Technician:    Patient Complaints: This is a 51 year-old man referred for evaluation of progressive right hand weakness and paresthesias.   NCV & EMG Findings: Extensive electrodiagnostic testing of the right upper extremity and additional studies of the left shows: Bilateral median, left ulnar, and left lateral antebrachial sensory responses show prolonged latency (R5.0, L5.3, L3.8, L4.1 ms) amplitude (R6.0, L4.1, L3.9, L11.0 V).  Right ulnar and bilateral medial antebrachial cuteanous sensory responses are absent. Right lateral antebrachial cutaneous sensory response is reduced (6.7 V).  Bilateral radial sensory responses are within normal limits.  Right median and ulnar motor response shows prolonged latency (R4.8, R4.6 ms), reduced amplitude (R5.1, R1.2 mV), and slowed conduction velocity along the course of the nerve.  Left median motor response shows prolonged latency (4.3 ms) and slowed conduction velocity.  The left ulnar motor response shows conduction block across the elbow and slowed conduction velocity along the course of the nerve. Ulnar F-wave is absent on the right and prolonged on the left.  Diffuse chronic motor axon loss changes are seen affecting all the tested muscles of the right upper extremity, which is more severe distally where there are fibrillation potentials.  Active denervation is also seen in the right deltoid and low paraspinal muscles.  In the left upper extremity, reduced motor recruitment is seen with increased insertional activity.     Impression: This is a complex study.  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    ___________________________ 40, DO    Nerve Conduction Studies Anti Sensory Summary Table   Stim Site NR Peak (ms) Norm Peak (ms) O-P Amp (V) Norm O-P Amp  Left Lat Ante Brach Cutan Anti Sensory (Lat Forearm)  32C  Lat Biceps    4.1 <2.9 11.0 >12  Right Lat Ante Brach Cutan Anti Sensory (Lat Forearm)  32C  Lat Biceps    2.4 <2.9 6.7 >12  Left Med Ante Brach Cutan Anti Sensory (Med Forearm)  32C  Elbow NR      Right Med Ante Brach Cutan Anti Sensory (Med Forearm)  32C  Elbow NR      Left Median Anti Sensory (2nd Digit)  32C  Wrist    5.3 <3.6 4.1 >15  Right Median Anti Sensory (2nd Digit)  32C  Wrist    5.0 <3.6 6.0 >15  Left Radial Anti Sensory (Base 1st Digit)  32C  Wrist    2.3 <2.7 18.4 >14  Right Radial Anti Sensory (Base 1st Digit)  32C  Wrist    2.2 <2.7 16.7 >14  Left Ulnar Anti Sensory (5th Digit)  32C  Wrist    3.8 <3.1 3.9 >10  Right Ulnar Anti Sensory (5th Digit)  32C  Wrist NR  <3.1  >10   Motor Summary Table   Stim Site NR Onset (ms) Norm Onset (ms) O-P Amp (mV) Norm O-P Amp Site1 Site2 Delta-0 (ms) Dist (cm) Vel (m/s) Norm Vel (m/s)  Left Median Motor (Abd Poll Brev)  32C  Wrist    4.3 <4.0 7.4 >6 Elbow Wrist 7.0 33.0 47 >50  Elbow    11.3  6.3         Right Median Motor (Abd Poll Brev)  32C  Wrist    4.8 <4.0 5.1 >6 Elbow Wrist 7.6 32.0 42 >50  Elbow    12.4  4.6         Left Ulnar Motor (Abd Dig Minimi)  32C  Wrist    2.7 <3.1 9.3 >7 B Elbow Wrist 6.1 29.0 48 >50  B Elbow    8.8  8.2  A Elbow B Elbow 3.4 10.0 29 >50  A Elbow    12.2  5.9         Right Ulnar Motor (Abd Dig Minimi)  32C  Wrist    4.6 <3.1 1.2 >7 B Elbow Wrist 7.0 29.0 41 >50  B Elbow    11.6  0.6  A Elbow B Elbow 3.2 10.0 31 >50  A Elbow    14.8  0.7          F Wave Studies   NR F-Lat (ms) Lat Norm (ms) L-R F-Lat (ms)  Left Ulnar (Mrkrs) (Abd Dig Min)  32C     39.74 <33   Right Ulnar (Mrkrs) (Abd Dig Min)   32C  NR  <33    EMG   Side Muscle Ins Act Fibs Fasc Recrt Dur. Amp. Poly. Activation Comment  Left Deltoid 1+ Nml Nml 1- Nml Nml Nml Nml N/A  Left Biceps Nml Nml Nml 1- Nml Nml Nml Nml N/A  Left Triceps Nml Nml Nml 1- Nml Nml Nml Nml N/A  Right Biceps Nml Nml Nml 2- 1+ 2+ 2+ Nml N/A  Right PronatorTeres Nml Nml Nml SMU 2+ 2+ 2+ Nml N/A  Left Abd Poll Brev 1+ Nml Nml 1- 1+ 1+ 1+ Nml N/A  Left 1stDorInt 1+ Nml Nml 1- 1+ 1+ 1+ Nml N/A  Left PronatorTeres Nml Nml Nml 1- 1+ 1+ 1+ Nml N/A  Right Deltoid Nml 1+ Nml 3- 1+ 1+ 1+ Nml N/A  Right Infraspinatus Nml Nml Nml 1- 1+ 1+ 1+ Nml N/A  Right 1stDorInt Nml 2+ Nml SMU 1+ 1+ 1+ Nml N/A  Right Triceps Nml Nml Nml 2- 1+ 1+ 1+ Nml N/A  Right Abd Poll Brev Nml Nml Nml 3- 1+ 1+ 1+ Nml N/A  Right Ext Indicis Nml 3+ Nml None - - - Nml N/A  Right ABD Dig Min Nml 2+ Nml None - - - Nml N/A  Right Cervical Parasp Low Nml 1+ Nml NE - - - NE N/A      Waveforms:

## 2022-03-24 NOTE — Telephone Encounter (Signed)
Ok to renew?  

## 2022-03-25 ENCOUNTER — Encounter: Payer: Self-pay | Admitting: Neurology

## 2022-03-25 ENCOUNTER — Telehealth: Payer: Self-pay

## 2022-03-25 ENCOUNTER — Ambulatory Visit: Payer: 59 | Admitting: Neurology

## 2022-03-25 VITALS — BP 149/86 | HR 75 | Ht 75.0 in | Wt 268.0 lb

## 2022-03-25 DIAGNOSIS — R768 Other specified abnormal immunological findings in serum: Secondary | ICD-10-CM

## 2022-03-25 DIAGNOSIS — G610 Guillain-Barre syndrome: Secondary | ICD-10-CM | POA: Diagnosis not present

## 2022-03-25 MED ORDER — GABAPENTIN 300 MG PO CAPS: 300.0000 mg | ORAL_CAPSULE | Freq: Every day | ORAL | 3 refills | Status: DC

## 2022-03-25 NOTE — Progress Notes (Signed)
Follow-up Visit   Date: 03/25/2022    Devin Erickson MRN: [id number] DOB: 1970/09/01    Devin Erickson is a 51 y.o. right-handed African American male with hyperlipidemia and anxiety returning to the clinic for follow-up of polyradiculoneuropathy.  The patient was accompanied to the clinic by self.   IMPRESSION/PLAN: Right >> left upper extremity weakness with NCS/EMG most suggestive of polyradiculoneuropathy.  To further evaluation, I recommend CSF testing to look for signs of inflammation/immune-mediated process.  Discussed with patient that based on these findings, a trial of high dose steroids would be the next step.  - Check CSF cell count and diff, protein, glucose, IgG index, oligoclonal bands, myelin basic protein, flow cytology, CSF ACE  - Start gabapentin 361m at bedtime   - Occupational therapy visit is scheduled for next month  2.  Serology testing shows positive ANA with centromere antibody titer of 1:640.  The significance of this is uncertain and he has been referred to rheumatology, however, the appointment is not until May 2024.  Return to clinic in 2 months, or sooner as needed  --------------------------------------------- History of present illness: Starting around April 2023, he began noticing mild weakness in the arm especially when weight lifting, it was more effortful on the right side.  Over the summer, he noticed weakness in grip on the right hand which continued to progress to the point where he is unable to use his hand to crank his ignition, turning keys to entire his home, grasping objects, and using utensils.  He is dropping things frequently.  He has difficulty straightening the hand.  He denies weakness with raising the arm or bending at the elbow.  He has mild pain in the right shoulder, described as a nagging pain.  He treated it with tylenol.  Occasionally, he has numbness over the tips of the fingers.     He denies similar symptoms in the  left hand.  No numbness/tingling in the feet.  He denies any ongoing pain or neck pain.  MRI cervical spine was performed in July which shows cervical spondylosis and multilevel foraminal stenosis at right C3-C4, bilateral C4-5 and left C6-7. Additionally, there was note of edema signal in the C6 and C7 spinous process as well as interspinous ligaments at C5-6, C6-7, C7-T1, and T1-T2.     NCS/EMG performed in September shows diffuse prolonged latency, slowed motor conduction velocity, and reduced amplitude involving the median and ulnar nerves.  Needle EMG shows active fibrillation potentials in the FDI, ABP, EDC, and biceps muscles.  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 Labs indicate positive ANA.   UPDATE 03/25/2022:  He is here to discuss results of EMG which favors a polyradiculoneuropathy affecting both arms, worse on the right.  He reports having discomfort in the left shoulder, similar to what he experienced in the beginning on the right.  No numbness/tinging of the left arm.  His right hand remains unchanged and continues to have weakness.  He has difficulty with fine motor tasks.   Medications:  Current Outpatient Medications on File Prior to Visit  Medication Sig Dispense Refill   albuterol (VENTOLIN HFA) 108 (90 Base) MCG/ACT inhaler Inhale 2 puffs into the lungs every 6 (six) hours as needed for wheezing or shortness of breath. 8 g 0   cetirizine (ZYRTEC) 10 MG tablet Take 10  mg by mouth daily.     meloxicam (MOBIC) 7.5 MG tablet TAKE 1 TABLET BY MOUTH EVERY DAY 30 tablet 0   Multiple Vitamin (MULTIVITAMIN) tablet Take 1 tablet by mouth daily.     rosuvastatin (CRESTOR) 20 MG tablet TAKE 1 TABLET BY MOUTH EVERYDAY AT BEDTIME 90 tablet 3   No current facility-administered medications on file prior to visit.    Allergies: No Known  Allergies  Vital Signs:  BP (!) 149/86   Pulse 75   Ht _0  (1.905 m)   Wt 268 lb (121.6 kg)   SpO2 98%   BMI 33.50 kg/m   Neurological Exam: MENTAL STATUS including orientation to time, place, person, recent and remote memory, attention span and concentration, language, and fund of knowledge is normal.  Speech is not dysarthric.  CRANIAL NERVES:   Normal conjugate, extra-ocular eye movements in all directions of gaze.  No ptosis.  Face is symmetric. Palate elevates symmetrically.  Tongue is midline.  MOTOR:  Right FDI, ADM, ABP, and medial forearm atrophy.  No fasciculations or abnormal movements.  No pronator drift.    Upper Extremity:  Right   Left  Deltoid  5/5    5/5   Biceps  5-/5    5/5   Triceps  5/5    5/5   Wrist extensors  5/5    5/5   Wrist flexors  5-/5    5/5   Finger extensors  3/5    5/5   Finger flexors  4/5    5/5   Dorsal interossei  3/5    5-/5   Abductor pollicis  3/5    5/5   Tone (Ashworth scale)  0   0    Lower Extremity:  Right   Left  Hip flexors  5/5    5/5   Knee flexors  5/5    5/5   Dorsiflexors  5/5    5/5   Plantarflexors  5/5    5/5   Toe extensors  5/5    5/5   Toe flexors  5/5    5/5   Tone (Ashworth scale)  0   0    MSRs:                                              Right        Left brachioradialis tr   1+  biceps tr   1+  triceps tr   1+  patellar 1+   1+  ankle jerk 1+   1+  Hoffman no   no  plantar response down   down    SENSORY:  Reduced pin prick and temperature in the right palm, compared to the left.  Sensation intact in the legs.     COORDINATION/GAIT: Normal finger-to- nose-finger.  Finger tapping slowed on the right due to weakness.   Gait narrow based and stable. Tandem and stressed gait intact.   Data: Labs 02/18/2022:  vitamin B12 880, folate > 23, CRP <1.0, ESR 16, TSH 1.65, ANCA neg, ACE 33, copper 110, SPEP with IFE no M protein, cryo neg, vitamin B1 7*, ANA positive, centromere antibody 1:640*  NCS/EMG of  bilateral arms 03/24/2022 performed at [facility]: This is a complex study.  Findings are most suggestive of a subacute demyelinating and axonal polyradiculoneuropathy affecting the upper extremities,  which is worse on the right.    NCS/EMG of the right arm 01/08/2022 performed at [facility]: EMG & NCV Findings:  Impression: The above electrodiagnostic study is ABNORMAL but is difficult to fully interpret.  The needle EMG shows significant denervation but active motor unit potentials in multiple muscles which could be consistent with cervical stenosis but the MRI does not show any cervical stenosis.  This could be a brachial plexus type lesion although it does not seem to fit to a specific trauma although it is closest to an upper trunk brachial plexus lesion.  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    However the patient seems to have a concomitant median neuropathy at the wrist but also slowing of the ulnar nerve distal to the elbow and this would represent more of a demyelination type lesion on top of what was seen with the denervation potentials.     MRI cervical spine wo contrast 11/03/21: Intermittently motion degraded exam.   Indeterminate 18 mm T2 STIR hyperintense and T1 hypointense lesion within the T1 vertebral body. While this may reflect an atypical hemangioma, alternative etiologies (including osseous metastatic disease) cannot be excluded. Short-interval 2-3 month MRI follow-up without and with contrast is recommended to monitor this finding.   Marrow edema within the C6 and C7 spinous processes. Edema signal also present within the interspinous ligaments at C5-C6, C6-C7, C7-T1 and T1-T2. Findings are nonspecific, but possibly degenerative or posttraumatic in etiology. If there has been recent trauma, the interspinous ligament edema could reflect interspinous ligament injury and a cervical spine CT should be considered to exclude C6/C7 spinous process fractures.  Additionally, attention to these sites recommended at MRI imaging follow-up.   Cervical spondylosis, as outlined. No more than mild relative spinal canal narrowing. Multilevel foraminal stenosis, as detailed and greatest on the right at C3-C4 (moderate), bilaterally at C4-C5 (mild-to-moderate) and on the left at C6-C7 (mild-to-moderate).   Nonspecific straightening of the expected cervical lordosis.  Total time spent reviewing records, interview, history/exam, documentation, and coordination of care on day of encounter:  40 min    Thank you for allowing me to participate in patient's care.  If I can answer any additional questions, I would be pleased to do so.    Sincerely,    Otniel Hoe K. Posey Pronto, DO

## 2022-03-25 NOTE — Patient Instructions (Addendum)
Radiology will contact you to schedule lumbar puncture  Start gabapentin 300mg  at bedtime  Return to clinic in 2 months

## 2022-03-25 NOTE — Telephone Encounter (Signed)
-----   Message from Glendale Chard, DO sent at 03/25/2022 12:30 PM EST ----- Please order LP.  Check CSF cell count and diff, protein, glucose, IgG index, oligoclonal bands, myelin basic protein, flow cytology, CSF ACE.  Thanks

## 2022-04-07 ENCOUNTER — Other Ambulatory Visit: Payer: Self-pay

## 2022-04-07 ENCOUNTER — Ambulatory Visit: Payer: 59 | Admitting: Occupational Therapy

## 2022-04-07 DIAGNOSIS — R768 Other specified abnormal immunological findings in serum: Secondary | ICD-10-CM

## 2022-04-07 DIAGNOSIS — G629 Polyneuropathy, unspecified: Secondary | ICD-10-CM

## 2022-04-07 DIAGNOSIS — R29898 Other symptoms and signs involving the musculoskeletal system: Secondary | ICD-10-CM

## 2022-04-07 DIAGNOSIS — G610 Guillain-Barre syndrome: Secondary | ICD-10-CM

## 2022-04-07 NOTE — Telephone Encounter (Signed)
Before Lumbar Puncture can be scheduled patient will need a CT or MRI of head done. Order for CT has been placed. Called patient to let him know about this and had to leave a message for a call back.

## 2022-04-21 ENCOUNTER — Other Ambulatory Visit: Payer: Self-pay | Admitting: Orthopaedic Surgery

## 2022-04-22 ENCOUNTER — Telehealth: Payer: Self-pay

## 2022-04-22 NOTE — Telephone Encounter (Signed)
Patient called and wanted a refill of Meloxicam. Dr.Whitfield said that patient needed to have a CMET done before he would refill. I have called patient. No answer. LMOM for patient with this information. Ask him to call back if he wants to have this labwork done here.

## 2022-04-22 NOTE — Telephone Encounter (Signed)
Needs CMET before receiving another Rx

## 2022-05-12 ENCOUNTER — Ambulatory Visit
Admission: RE | Admit: 2022-05-12 | Discharge: 2022-05-12 | Disposition: A | Discharge status: Home / Self Care | Payer: 59 | Source: Ambulatory Visit | Attending: Neurology | Admitting: Neurology

## 2022-05-12 DIAGNOSIS — R29898 Other symptoms and signs involving the musculoskeletal system: Secondary | ICD-10-CM

## 2022-05-12 DIAGNOSIS — G610 Guillain-Barre syndrome: Secondary | ICD-10-CM

## 2022-05-12 DIAGNOSIS — R768 Other specified abnormal immunological findings in serum: Secondary | ICD-10-CM

## 2022-05-12 DIAGNOSIS — G629 Polyneuropathy, unspecified: Secondary | ICD-10-CM

## 2022-05-15 ENCOUNTER — Telehealth: Payer: Self-pay

## 2022-05-15 NOTE — Telephone Encounter (Signed)
Left detailed message per DPR that CT was normal and that we informed [facility] imaging of this and patient is ready to schedule his LP. I left my contact information incase patient had any additional questions or concerns.

## 2022-05-15 NOTE — Telephone Encounter (Signed)
Patient is returning a call about his imaging results. States he would be available around 12:30 or 2 pm today.

## 2022-05-20 ENCOUNTER — Encounter: Payer: 59 | Admitting: Occupational Therapy

## 2022-05-26 ENCOUNTER — Ambulatory Visit
Admission: RE | Admit: 2022-05-26 | Discharge: 2022-05-26 | Disposition: A | Discharge status: Home / Self Care | Payer: 59 | Source: Ambulatory Visit | Attending: Neurology | Admitting: Neurology

## 2022-05-26 ENCOUNTER — Other Ambulatory Visit (HOSPITAL_COMMUNITY)
Admission: RE | Admit: 2022-05-26 | Discharge: 2022-05-26 | Disposition: A | Discharge status: Home / Self Care | Payer: 59 | Source: Ambulatory Visit | Attending: Neurology | Admitting: Neurology

## 2022-05-26 VITALS — BP 131/84 | HR 61

## 2022-05-26 DIAGNOSIS — M5412 Radiculopathy, cervical region: Secondary | ICD-10-CM

## 2022-05-26 DIAGNOSIS — G5691 Unspecified mononeuropathy of right upper limb: Secondary | ICD-10-CM | POA: Insufficient documentation

## 2022-05-26 DIAGNOSIS — R768 Other specified abnormal immunological findings in serum: Secondary | ICD-10-CM

## 2022-05-26 DIAGNOSIS — M5410 Radiculopathy, site unspecified: Secondary | ICD-10-CM | POA: Insufficient documentation

## 2022-05-26 DIAGNOSIS — G610 Guillain-Barre syndrome: Secondary | ICD-10-CM

## 2022-05-26 NOTE — Progress Notes (Signed)
1 vial of blood drawn from pts RAC to be sent off with LP lab work. 1 successful attempt, pt tolerated well. Gauze and tape applied after.

## 2022-05-26 NOTE — Discharge Instructions (Signed)

## 2022-05-28 ENCOUNTER — Telehealth: Payer: Self-pay | Admitting: Anesthesiology

## 2022-05-28 NOTE — Telephone Encounter (Signed)
Pt called stating he had a spinal tap done 05/27/2022, states he was told the results will be ready between 3-5 days. He has an appointment scheduled with Dr Posey Pronto on 06/01/2022 to discuss results, pt wants to know if results are not back by Monday does he still need to come to appointment or can he reschedule until results come in.

## 2022-05-28 NOTE — Telephone Encounter (Signed)
Keep appointment on Monday, as scheduled.  I will review results with him at that visit.

## 2022-05-28 NOTE — Telephone Encounter (Signed)
Called patient and left a message for a call back

## 2022-05-29 LAB — CYTOLOGY - NON PAP

## 2022-05-29 NOTE — Telephone Encounter (Signed)
Pt returned call

## 2022-05-29 NOTE — Telephone Encounter (Signed)
Called patient and informed him that results will be ready by his appt on 2/5 and Dr. Posey Pronto will go over results with him. Patient verbalized understanding and thanked me for the call.

## 2022-05-29 NOTE — Progress Notes (Signed)
Follow-up Visit   Date: 06/01/2022    Devin Erickson MRN: [id number] DOB: June 14, 1970    Devin Erickson is a 52 y.o. right-handed African American male with hyperlipidemia and anxiety returning to the clinic for follow-up of polyradiculoneuropathy.  The patient was accompanied to the clinic by self.   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  - Start Solumedrol 1g x 5 days, followed by 1g every 28 days. Side effects discussed.   - Continue gabapentin 300mg  at bedtime  - Continue OT  2.  Serology testing shows positive ANA with centromere antibody titer of 1:640.  Unsure if this is due to systemic illness or consistent with specific autoimmune condition. Rheumatology evaluation is pending.   Return to clinic in 2 months, or sooner as needed  --------------------------------------------- History of present illness: Starting around April 2023, he began noticing mild weakness in the arm especially when weight lifting, it was more effortful on the right side.  Over the summer, he noticed weakness in grip on the right hand which continued to progress to the point where he is unable to use his hand to crank his ignition, turning keys to entire his home, grasping objects, and using utensils.  He is dropping things frequently.  He has difficulty straightening the hand.  He denies weakness with raising the arm or bending at the elbow.  He has mild pain in the right shoulder, described as a nagging pain.  He treated it with tylenol.  Occasionally, he has numbness over the tips of the fingers.     He denies similar symptoms in the left hand.  No numbness/tingling in the feet.  He denies any ongoing pain or neck pain.  MRI cervical spine was performed in July which shows cervical spondylosis  and multilevel foraminal stenosis at right C3-C4, bilateral C4-5 and left C6-7. Additionally, there was note of edema signal in the C6 and C7 spinous process as well as interspinous ligaments at C5-6, C6-7, C7-T1, and T1-T2.     NCS/EMG performed in September shows diffuse prolonged latency, slowed motor conduction velocity, and reduced amplitude involving the median and ulnar nerves.  Needle EMG shows active fibrillation potentials in the FDI, ABP, EDC, and biceps muscles.  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 Labs indicate positive ANA.   UPDATE 03/25/2022:  He is here to discuss results of EMG which favors a polyradiculoneuropathy affecting both arms, worse on the right.  He reports having discomfort in the left shoulder, similar to what he experienced in the beginning on the right.  No numbness/tinging of the left arm.  His right hand remains unchanged and continues to have weakness.  He has difficulty with fine motor tasks.   UPDATE 06/01/2022:  He is here for follow-up visit and discuss LP results.  There has been no significant change with his hand weakness or tingling, which remains worse in the right hand.  No similar symptoms in the legs or imbalance.  LP shows elevated protein of 116, normal white count and presence of oligoclonal bands.  IgG is normal.   Medications:  Current Outpatient Medications on File Prior to Visit  Medication Sig Dispense Refill   albuterol (VENTOLIN HFA) 108 (90 Base) MCG/ACT  inhaler Inhale 2 puffs into the lungs every 6 (six) hours as needed for wheezing or shortness of breath. 8 g 0   cetirizine (ZYRTEC) 10 MG tablet Take 10 mg by mouth daily.     gabapentin (NEURONTIN) 300 MG capsule Take 1 capsule (300 mg total) by mouth at bedtime. 30 capsule 3   Multiple Vitamin (MULTIVITAMIN) tablet Take 1 tablet by mouth  daily.     rosuvastatin (CRESTOR) 20 MG tablet TAKE 1 TABLET BY MOUTH EVERYDAY AT BEDTIME 90 tablet 3   No current facility-administered medications on file prior to visit.    Allergies: No Known Allergies  Vital Signs:  BP (!) 150/91   Pulse 82   Ht 6\' 3"  (1.905 m)   Wt 269 lb (122 kg)   SpO2 99%   BMI 33.62 kg/m   Neurological Exam: MENTAL STATUS including orientation to time, place, person, recent and remote memory, attention span and concentration, language, and fund of knowledge is normal.  Speech is not dysarthric.  CRANIAL NERVES:   Normal conjugate, extra-ocular eye movements in all directions of gaze.  No ptosis.  Face is symmetric. Palate elevates symmetrically.  Tongue is midline.  MOTOR:  Right FDI, ADM, ABP, and medial forearm atrophy.  No fasciculations or abnormal movements.  No pronator drift.    Upper Extremity:  Right   Left  Deltoid  5/5    5/5   Biceps  5-/5    5/5   Triceps  5/5    5/5   Wrist extensors  5/5    5/5   Wrist flexors  5-/5    5/5   Finger extensors  3/5    5/5   Finger flexors  4/5    5/5   Dorsal interossei  3/5    5-/5   Abductor pollicis  3/5    5/5   Tone (Ashworth scale)  0   0    Lower Extremity:  Right   Left  Hip flexors  5/5    5/5   Knee flexors  5/5    5/5   Dorsiflexors  5/5    5/5   Plantarflexors  5/5    5/5   Toe extensors  5/5    5/5   Toe flexors  5/5    5/5   Tone (Ashworth scale)  0   0    MSRs:                                              Right        Left brachioradialis tr   1+  biceps tr   1+  triceps tr   1+  patellar 1+   1+  ankle jerk 1+   1+  Hoffman no   no  plantar response down   down    SENSORY:  Reduced pin prick and temperature in the right palm, compared to the left.  Sensation intact in the legs.     COORDINATION/GAIT: Normal finger-to- nose-finger.  Finger tapping slowed on the right due to weakness.   Gait narrow based and stable. Tandem and stressed gait intact.   Data: Labs  02/18/2022:  vitamin B12 880, folate > 23, CRP <1.0, ESR 16, TSH 1.65, ANCA neg, ACE 33, copper 110, SPEP with IFE no M protein, cryo neg, vitamin B1 7*, ANA positive,  centromere antibody 1:640*  NCS/EMG of bilateral arms 03/24/2022 performed at [facility] Neurology: This is a complex study.  Findings are most suggestive of a subacute demyelinating and axonal polyradiculoneuropathy affecting the upper extremities, which is worse on the right.    NCS/EMG of the right arm 01/08/2022 performed at [facility]: EMG & NCV Findings:  Impression: The above electrodiagnostic study is ABNORMAL but is difficult to fully interpret.  The needle EMG shows significant denervation but active motor unit potentials in multiple muscles which could be consistent with cervical stenosis but the MRI does not show any cervical stenosis.  This could be a brachial plexus type lesion although it does not seem to fit to a specific trauma although it is closest to an upper trunk brachial plexus lesion.  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    However the patient seems to have a concomitant median neuropathy at the wrist but also slowing of the ulnar nerve distal to the elbow and this would represent more of a demyelination type lesion on top of what was seen with the denervation potentials.     MRI cervical spine wo contrast 11/03/21: Intermittently motion degraded exam.   Indeterminate 18 mm T2 STIR hyperintense and T1 hypointense lesion within the T1 vertebral body. While this may reflect an atypical hemangioma, alternative etiologies (including osseous metastatic disease) cannot be excluded. Short-interval 2-3 month MRI follow-up without and with contrast is recommended to monitor this finding.   Marrow edema within the C6 and C7 spinous processes. Edema signal also present within the interspinous ligaments at C5-C6, C6-C7, C7-T1 and T1-T2. Findings are nonspecific, but possibly degenerative or posttraumatic in  etiology. If there has been recent trauma, the interspinous ligament edema could reflect interspinous ligament injury and a cervical spine CT should be considered to exclude C6/C7 spinous process fractures. Additionally, attention to these sites recommended at MRI imaging follow-up.   Cervical spondylosis, as outlined. No more than mild relative spinal canal narrowing. Multilevel foraminal stenosis, as detailed and greatest on the right at C3-C4 (moderate), bilaterally at C4-C5 (mild-to-moderate) and on the left at C6-C7 (mild-to-moderate).   Nonspecific straightening of the expected cervical lordosis.  CSF 05/26/2022:  R0 W1 P116*  G56   ACE 5, IgG index 0.54, MBP pending, OCB present*, cytology negative    Thank you for allowing me to participate in patient's care.  If I can answer any additional questions, I would be pleased to do so.    Sincerely,    Sher Hellinger K. Allena Katz, DO /

## 2022-06-01 ENCOUNTER — Telehealth: Payer: Self-pay | Admitting: Neurology

## 2022-06-01 ENCOUNTER — Ambulatory Visit: Payer: 59 | Admitting: Neurology

## 2022-06-01 ENCOUNTER — Encounter: Payer: Self-pay | Admitting: Neurology

## 2022-06-01 VITALS — BP 150/91 | HR 82 | Ht 75.0 in | Wt 269.0 lb

## 2022-06-01 DIAGNOSIS — G6181 Chronic inflammatory demyelinating polyneuritis: Secondary | ICD-10-CM | POA: Diagnosis not present

## 2022-06-01 NOTE — Telephone Encounter (Signed)
Reviewed

## 2022-06-01 NOTE — Telephone Encounter (Signed)
[facility] called in and left a message with the access nurse on 05/31/22. There is critical test results.  Full access nurse report is in Dr. Serita Grit box

## 2022-06-01 NOTE — Patient Instructions (Addendum)
Start steroid infusion for 5 treatments, followed by once per month  Start occupational therapy  Return to clinic in end-April

## 2022-06-04 LAB — PROTEIN, CSF: Total Protein, CSF: 116 mg/dL — ABNORMAL HIGH (ref 15–45)

## 2022-06-04 LAB — OLIGOCLONAL BANDS, CSF + SERM

## 2022-06-04 LAB — CNS IGG SYNTHESIS RATE, CSF+BLOOD
Albumin Serum: 4.2 g/dL (ref 3.6–5.1)
Albumin, CSF: 69.2 mg/dL — ABNORMAL HIGH (ref 8.0–42.0)
CNS-IgG Synthesis Rate: 6.6 mg/24 h — ABNORMAL HIGH (ref <=3.3)
IgG (Immunoglobin G), Serum: 1260 mg/dL (ref 600–1640)
IgG Total CSF: 11.3 mg/dL — ABNORMAL HIGH (ref 0.8–7.7)
IgG-Index: 0.54 (ref <0.70)

## 2022-06-04 LAB — MYELIN BASIC PROTEIN, CSF: Myelin Basic Protein: <2 mcg/L (ref <=4.0)

## 2022-06-04 LAB — ANGIOTENSIN CONVERTING ENZYME, CSF: ANGIOTENSIN CONVERTING ENZYME ( ACE) CSF: 5 U/L (ref <=15)

## 2022-06-04 LAB — CSF CELL COUNT WITH DIFFERENTIAL
RBC Count, CSF: 0 cells/uL
TOTAL NUCLEATED CELL: 1 cells/uL (ref 0–5)

## 2022-06-04 LAB — GLUCOSE, CSF: Glucose, CSF: 56 mg/dL (ref 40–80)

## 2022-06-08 ENCOUNTER — Ambulatory Visit: Payer: Managed Care, Other (non HMO)

## 2022-06-08 ENCOUNTER — Telehealth: Payer: Self-pay | Admitting: Pharmacy Technician

## 2022-06-08 ENCOUNTER — Encounter: Payer: Self-pay | Admitting: Neurology

## 2022-06-08 VITALS — BP 144/90 | HR 73 | Temp 97.7°F | Resp 18 | Ht 75.0 in | Wt 268.6 lb

## 2022-06-08 DIAGNOSIS — G6181 Chronic inflammatory demyelinating polyneuritis: Secondary | ICD-10-CM

## 2022-06-08 MED ORDER — SODIUM CHLORIDE 0.9 % IV SOLN
1000.0000 mg | Freq: Once | INTRAVENOUS | Status: AC
  Administered 2022-06-08: 1000 mg via INTRAVENOUS
  Filled 2022-06-08: qty 16

## 2022-06-08 NOTE — Progress Notes (Signed)
Diagnosis: CIDP  Provider:  Marshell Garfinkel MD  Procedure: Infusion  IV Type: Peripheral, IV Location: R Forearm  Solumedrol (Methylprednisolone), Dose: 1000 mg  Infusion Start Time: 0907  Infusion Stop Time: 1010  Post Infusion IV Care: Peripheral IV Discontinued  Discharge: Condition: Good, Destination: Home . AVS Provided and AVS Declined  Performed by:  Cleophus Molt, RN

## 2022-06-08 NOTE — Telephone Encounter (Signed)
Auth Submission: NO AUTH NEEDED Payer: [employer] Medication & CPT/J Code(s) submitted: Solumedrol (Methylprednisolone) J2930 Route of submission (phone, fax, portal):  Phone # Fax # Auth type: Buy/Bill Units/visits requested: 5 Reference number:  Approval from: 06/08/22 to 04/27/23

## 2022-06-09 ENCOUNTER — Telehealth: Payer: Self-pay | Admitting: Anesthesiology

## 2022-06-09 NOTE — Telephone Encounter (Signed)
Called patient and left a message for a call back.  

## 2022-06-09 NOTE — Telephone Encounter (Signed)
Pt received his first Solumedrol infusion yesterday, states his blood pressure has been high, heart rate staying around 98 to 99 while laying down. Did not get any sleep last night. Call back [phone]. Can leave detailed message.

## 2022-06-09 NOTE — Telephone Encounter (Signed)
Left message with after hour service on 06-09-22   Returning a call to the office

## 2022-06-09 NOTE — Telephone Encounter (Signed)
Can you find out how high his BP is? Any other symptoms like headache, vision changes, chest pain, shortness of breath?  These are known side effects with steroids.  We may need to ask his PCP for any recommendations for BP management while he takes his steroids.

## 2022-06-10 ENCOUNTER — Ambulatory Visit: Payer: Managed Care, Other (non HMO) | Admitting: Occupational Therapy

## 2022-06-10 NOTE — Telephone Encounter (Signed)
Patient is returning a call. °

## 2022-06-10 NOTE — Telephone Encounter (Signed)
Spoke to patient and he stated after his infusion on Monday he had elevated heart rate of 138 and blood pressure of 144/87. Patient stated that he got no sleep at all Monday night due to how he was feeling. He states that yesterday around 3:00 pm his heart rate and blood pressure has returned to normal and is feeling better. Patient stated that he has another infusion scheduled for tomorrow at 3:30 pm. Patient is aware that I will give him a call back as soon as I hear back from Dr. Posey Pronto.

## 2022-06-11 ENCOUNTER — Ambulatory Visit: Payer: Managed Care, Other (non HMO)

## 2022-06-11 ENCOUNTER — Other Ambulatory Visit: Payer: Self-pay | Admitting: Pharmacy Technician

## 2022-06-11 VITALS — BP 147/84 | HR 68 | Temp 97.8°F | Resp 18 | Ht 75.0 in | Wt 264.0 lb

## 2022-06-11 DIAGNOSIS — G6181 Chronic inflammatory demyelinating polyneuritis: Secondary | ICD-10-CM | POA: Diagnosis not present

## 2022-06-11 MED ORDER — SODIUM CHLORIDE 0.9 % IV SOLN
500.0000 mg | Freq: Once | INTRAVENOUS | Status: AC
  Administered 2022-06-11: 500 mg via INTRAVENOUS
  Filled 2022-06-11: qty 8

## 2022-06-11 MED ORDER — SODIUM CHLORIDE 0.9 % IV SOLN
1000.0000 mg | Freq: Once | INTRAVENOUS | Status: DC
  Filled 2022-06-11: qty 16

## 2022-06-11 MED ORDER — METHYLPREDNISOLONE SODIUM SUCC 500 MG IJ SOLR
500.0000 mg | Freq: Once | INTRAMUSCULAR | Status: DC
  Filled 2022-06-11: qty 500

## 2022-06-11 NOTE — Progress Notes (Signed)
Diagnosis: CIPD  Provider:  Marshell Garfinkel MD  Procedure: Infusion  IV Type: Peripheral, IV Location: R Forearm  Solumedrol (Methylprednisolone), Dose: 500 mg  Infusion Start Time: [id number]  Infusion Stop Time: [id number]  Post Infusion IV Care: Peripheral IV Discontinued  Discharge: Condition: Good, Destination: Home . AVS Provided and AVS Declined  Performed by:  Cleophus Molt, RN

## 2022-06-11 NOTE — Telephone Encounter (Signed)
Because of his steroid side effects, I am going to reduce the dose in half and spread it out over the next few weeks.

## 2022-06-11 NOTE — Telephone Encounter (Signed)
  Called patient and informed him that because of his steroid side effects, Dr. Posey Pronto is going to reduce the dose in half and spread it out over the next few weeks.   Patient verbalized understanding and had no further questions or concerns.

## 2022-06-15 ENCOUNTER — Ambulatory Visit: Payer: Managed Care, Other (non HMO)

## 2022-06-15 VITALS — BP 128/81 | HR 70 | Temp 98.2°F | Resp 18 | Ht 75.0 in | Wt 265.8 lb

## 2022-06-15 DIAGNOSIS — G6181 Chronic inflammatory demyelinating polyneuritis: Secondary | ICD-10-CM | POA: Diagnosis not present

## 2022-06-15 MED ORDER — SODIUM CHLORIDE 0.9 % IV SOLN
500.0000 mg | Freq: Once | INTRAVENOUS | Status: AC
  Administered 2022-06-15: 500 mg via INTRAVENOUS
  Filled 2022-06-15: qty 8

## 2022-06-15 NOTE — Progress Notes (Signed)
Diagnosis: CIPD  Provider:  Marshell Garfinkel MD  Procedure: Infusion  IV Type: Peripheral, IV Location: L Hand  Solumedrol (Methylprednisolone), Dose: 500 mg  Infusion Start Time: [id number]  Infusion Stop Time: [id number]  Post Infusion IV Care: Peripheral IV Discontinued  Discharge: Condition: Good, Destination: Home . AVS Declined  Performed by:  Adelina Mings, LPN

## 2022-06-17 ENCOUNTER — Ambulatory Visit (INDEPENDENT_AMBULATORY_CARE_PROVIDER_SITE_OTHER): Payer: Managed Care, Other (non HMO)

## 2022-06-17 VITALS — BP 135/82 | HR 75 | Temp 98.4°F | Resp 16 | Ht 75.0 in | Wt 267.4 lb

## 2022-06-17 DIAGNOSIS — G6181 Chronic inflammatory demyelinating polyneuritis: Secondary | ICD-10-CM | POA: Diagnosis not present

## 2022-06-17 MED ORDER — SODIUM CHLORIDE 0.9 % IV SOLN
500.0000 mg | Freq: Once | INTRAVENOUS | Status: AC
  Administered 2022-06-17: 500 mg via INTRAVENOUS
  Filled 2022-06-17: qty 8

## 2022-06-17 NOTE — Progress Notes (Signed)
Diagnosis: CIPD  Provider:  Marshell Garfinkel MD  Procedure: Infusion  IV Type: Peripheral, IV Location: R Forearm  Solumedrol, Dose: 500 mg  Infusion Start Time: [id number]  Infusion Stop Time: O169303  Post Infusion IV Care: Peripheral IV Discontinued  Discharge: Condition: Good, Destination: Home . AVS Declined  Performed by:  Fraser Din [place]tt, RN

## 2022-06-18 ENCOUNTER — Ambulatory Visit: Payer: Managed Care, Other (non HMO)

## 2022-06-18 VITALS — BP 132/80 | HR 65 | Temp 97.6°F | Resp 18 | Ht 75.0 in | Wt 270.4 lb

## 2022-06-18 DIAGNOSIS — G6181 Chronic inflammatory demyelinating polyneuritis: Secondary | ICD-10-CM | POA: Diagnosis not present

## 2022-06-18 MED ORDER — SODIUM CHLORIDE 0.9 % IV SOLN
500.0000 mg | Freq: Once | INTRAVENOUS | Status: AC
  Administered 2022-06-18: 500 mg via INTRAVENOUS
  Filled 2022-06-18: qty 8

## 2022-06-18 NOTE — Progress Notes (Signed)
Diagnosis: CIDP  Provider:  Marshell Garfinkel MD  Procedure: Infusion  IV Type: Peripheral, IV Location: L Forearm  Solumedrol (Methylprednisolone), Dose: 500 mg  Infusion Start Time: [id number]  Infusion Stop Time: 1640  Post Infusion IV Care: Peripheral IV Discontinued  Discharge: Condition: Good, Destination: Home . AVS Provided and AVS Declined  Performed by:  Cleophus Molt, RN

## 2022-06-19 ENCOUNTER — Ambulatory Visit (INDEPENDENT_AMBULATORY_CARE_PROVIDER_SITE_OTHER): Payer: Managed Care, Other (non HMO)

## 2022-06-19 VITALS — BP 141/84 | HR 68 | Temp 98.0°F | Resp 18 | Wt 270.4 lb

## 2022-06-19 DIAGNOSIS — G6181 Chronic inflammatory demyelinating polyneuritis: Secondary | ICD-10-CM | POA: Diagnosis not present

## 2022-06-19 MED ORDER — SODIUM CHLORIDE 0.9 % IV SOLN
500.0000 mg | Freq: Once | INTRAVENOUS | Status: AC
  Administered 2022-06-19: 500 mg via INTRAVENOUS
  Filled 2022-06-19: qty 8

## 2022-06-19 NOTE — Progress Notes (Signed)
Diagnosis: CIDP  Provider:  Marshell Garfinkel MD  Procedure: Infusion  IV Type: Peripheral, IV Location: R Forearm  Solumedrol (Methylprednisolone), Dose: 500 mg  Infusion Start Time: [id number]  Infusion Stop Time: U8551146  Post Infusion IV Care: Peripheral IV Discontinued  Discharge: Condition: Good, Destination: Home . AVS Declined  Performed by:  Cleophus Molt, RN

## 2022-06-23 ENCOUNTER — Ambulatory Visit (INDEPENDENT_AMBULATORY_CARE_PROVIDER_SITE_OTHER): Payer: Managed Care, Other (non HMO)

## 2022-06-23 VITALS — BP 131/84 | HR 78 | Temp 99.1°F | Resp 18 | Ht 73.0 in | Wt 267.0 lb

## 2022-06-23 DIAGNOSIS — G6181 Chronic inflammatory demyelinating polyneuritis: Secondary | ICD-10-CM

## 2022-06-23 MED ORDER — SODIUM CHLORIDE 0.9 % IV SOLN
500.0000 mg | Freq: Once | INTRAVENOUS | Status: AC
  Administered 2022-06-23: 500 mg via INTRAVENOUS
  Filled 2022-06-23: qty 8

## 2022-06-23 NOTE — Progress Notes (Signed)
Diagnosis: CIDP  Provider:  Marshell Garfinkel MD  Procedure: Infusion  IV Type: Peripheral, IV Location: R Forearm  Solumedrol (Methylprednisolone), Dose: 500 mg  Infusion Start Time: [id number]  Infusion Stop Time: L5235779  Post Infusion IV Care: Peripheral IV Discontinued  Discharge: Condition: Good, Destination: Home . AVS Provided and AVS Declined  Performed by:  Arnoldo Morale, RN

## 2022-06-24 ENCOUNTER — Ambulatory Visit (INDEPENDENT_AMBULATORY_CARE_PROVIDER_SITE_OTHER): Payer: Managed Care, Other (non HMO)

## 2022-06-24 VITALS — BP 166/75 | HR 74 | Temp 98.6°F | Resp 18 | Ht 75.0 in | Wt 266.6 lb

## 2022-06-24 DIAGNOSIS — G6181 Chronic inflammatory demyelinating polyneuritis: Secondary | ICD-10-CM

## 2022-06-24 MED ORDER — SODIUM CHLORIDE 0.9 % IV SOLN
500.0000 mg | Freq: Once | INTRAVENOUS | Status: AC
  Administered 2022-06-24: 500 mg via INTRAVENOUS
  Filled 2022-06-24: qty 8

## 2022-06-24 NOTE — Progress Notes (Signed)
Diagnosis: CIDP  Provider:  Marshell Garfinkel MD  Procedure: Infusion  IV Type: Peripheral, IV Location: L Forearm  Solumedrol (Methylprednisolone), Dose: 500  Infusion Start Time: [id number]  Infusion Stop Time: O169303  Post Infusion IV Care: Peripheral IV Discontinued  Discharge: Condition: Good, Destination: Home . AVS Declined  Performed by:  Cleophus Molt, RN

## 2022-06-26 ENCOUNTER — Ambulatory Visit (INDEPENDENT_AMBULATORY_CARE_PROVIDER_SITE_OTHER): Payer: Managed Care, Other (non HMO)

## 2022-06-26 ENCOUNTER — Encounter: Payer: Self-pay | Admitting: Podiatry

## 2022-06-26 VITALS — BP 134/80 | HR 70 | Temp 97.8°F | Resp 18 | Ht 75.0 in | Wt 266.4 lb

## 2022-06-26 DIAGNOSIS — G6181 Chronic inflammatory demyelinating polyneuritis: Secondary | ICD-10-CM

## 2022-06-26 MED ORDER — SODIUM CHLORIDE 0.9 % IV SOLN
500.0000 mg | Freq: Once | INTRAVENOUS | Status: AC
  Administered 2022-06-26: 500 mg via INTRAVENOUS
  Filled 2022-06-26: qty 8

## 2022-06-26 NOTE — Progress Notes (Deleted)
Diagnosis: CIDP  Provider:  Marshell Garfinkel MD  Procedure: Infusion  IV Type: Peripheral, IV Location: L Forearm  Solumedrol (Methylprednisolone), Dose: 500 mg  Infusion Start Time: [id number]  Infusion Stop Time: ***  Post Infusion IV Care: {CHINF Post Infusion:25398}  Discharge: {Condition:19696:::1}, {Destination:18313::"Home":1} . [id number] Provided"}  Performed by:  Baxter Hire, RN

## 2022-06-26 NOTE — Progress Notes (Signed)
Diagnosis: CIDP  Provider:  Marshell Garfinkel MD  Procedure: Infusion  IV Type: Peripheral, IV Location: R Forearm  Solumedrol (Methylprednisolone), Dose: 500 mg  Infusion Start Time: [id number]  Infusion Stop Time: [id number]  Post Infusion IV Care: Peripheral IV Discontinued  Discharge: Condition: Good, Destination: Home . AVS Provided and AVS Declined  Performed by:  Arnoldo Morale, RN

## 2022-06-30 ENCOUNTER — Ambulatory Visit: Payer: 59 | Admitting: Podiatry

## 2022-07-01 ENCOUNTER — Ambulatory Visit (INDEPENDENT_AMBULATORY_CARE_PROVIDER_SITE_OTHER): Payer: 59 | Admitting: Podiatry

## 2022-07-01 DIAGNOSIS — Q828 Other specified congenital malformations of skin: Secondary | ICD-10-CM

## 2022-07-01 NOTE — Progress Notes (Signed)
  Subjective:  Patient ID: Devin Erickson, male    DOB: 1971-03-16,  MRN: [id number]  Chief Complaint  Patient presents with   Callouses    Possible callus on the bottom of foot causing discomfort     52 y.o. male presents with the above complaint.  Right lateral midfoot porokeratosis.  Patient states that it is painful it came back again history of multiple removal techniques he is known to Dr. Paulla Dolly.  He would like to discuss other options including surgical excision.   Review of Systems: Negative except as noted in the HPI. Denies N/V/F/Ch.  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   History of sleep apnea 04/2012   per overnight screen/oximetry    Obesity    Thalassemia minor    Wears glasses    Wears glasses     Current Outpatient Medications:    albuterol (VENTOLIN HFA) 108 (90 Base) MCG/ACT inhaler, Inhale 2 puffs into the lungs every 6 (six) hours as needed for wheezing or shortness of breath., Disp: 8 g, Rfl: 0   cetirizine (ZYRTEC) 10 MG tablet, Take 10 mg by mouth daily., Disp: , Rfl:    gabapentin (NEURONTIN) 300 MG capsule, Take 1 capsule (300 mg total) by mouth at bedtime., Disp: 30 capsule, Rfl: 3   Multiple Vitamin (MULTIVITAMIN) tablet, Take 1 tablet by mouth daily., Disp: , Rfl:    rosuvastatin (CRESTOR) 20 MG tablet, TAKE 1 TABLET BY MOUTH EVERYDAY AT BEDTIME, Disp: 90 tablet, Rfl: 3  Social History   Tobacco Use  Smoking Status Never  Smokeless Tobacco Never    No Known Allergies Objective:  There were no vitals filed for this visit. There is no height or weight on file to calculate BMI. Constitutional Well developed. Well nourished.  Vascular Dorsalis pedis pulses palpable bilaterally. Posterior tibial pulses palpable bilaterally. Capillary refill normal to all digits.  No cyanosis  or clubbing noted. Pedal hair growth normal.  Neurologic Normal speech. Oriented to person, place, and time. Epicritic sensation to light touch grossly present bilaterally.  Dermatologic Right plantar lateral midfoot for the touch central nuclearPorokeratotic lesion pain to core noted.  Underlying pes planovalgus deformity  Orthopedic: Normal joint ROM without pain or crepitus bilaterally. No visible deformities. No bony tenderness.   Radiographs: None Assessment:   1. Porokeratosis    Plan:  Patient was evaluated and treated and all questions answered.  Right Planter lateral porokeratotic lesion with underlying pes planovalgus -All questions and's were discussed with the patient in extensive detail. -Using chisel blade and handle as a courtesy the lesion was debrided down to healthy dry tissue.  No complication noted no pinpoint bleeding noted -If there is no improvement I briefly discussed surgical excision of the lesion.  I discussed with him that we will take 2 to 3 weeks to heal given that this is a plantar incision  No follow-ups on file.

## 2022-07-02 ENCOUNTER — Telehealth: Payer: Self-pay | Admitting: Anesthesiology

## 2022-07-02 NOTE — Telephone Encounter (Signed)
Pt left message stating he already had his first infusion of the Solumedrol. States he has a question on when the second infusion will be and how it will be scheduled.

## 2022-07-02 NOTE — Telephone Encounter (Signed)
Called patient and informed him that [facility] will be contacting him for his next infusion which is 4 weeks after the last infusion. Infusions will then continue every 4 weeks. Dr. Posey Pronto will reassess patient in 6 months.   Patient verbalized understanding and had no further questions or concerns.

## 2022-07-02 NOTE — Telephone Encounter (Signed)
Patient has completed initial steroid infusions. Maintenance therapy to start 4 weeks later.   500 mg X 2 days. Every 28 days. Will reassess after 6 months.   4 weeks after last infusion. Will continue every 4 weeks. Will have [facility] contact patient.

## 2022-07-06 ENCOUNTER — Other Ambulatory Visit: Payer: Self-pay | Admitting: Pharmacy Technician

## 2022-07-09 ENCOUNTER — Encounter: Payer: Self-pay | Admitting: Neurology

## 2022-07-27 ENCOUNTER — Ambulatory Visit: Payer: Managed Care, Other (non HMO) | Attending: Neurology | Admitting: Occupational Therapy

## 2022-07-27 ENCOUNTER — Encounter: Payer: Self-pay | Admitting: Occupational Therapy

## 2022-07-27 DIAGNOSIS — M6281 Muscle weakness (generalized): Secondary | ICD-10-CM | POA: Diagnosis present

## 2022-07-27 DIAGNOSIS — R208 Other disturbances of skin sensation: Secondary | ICD-10-CM

## 2022-07-27 DIAGNOSIS — R278 Other lack of coordination: Secondary | ICD-10-CM

## 2022-07-27 DIAGNOSIS — R29818 Other symptoms and signs involving the nervous system: Secondary | ICD-10-CM | POA: Diagnosis present
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
## 2022-07-30 ENCOUNTER — Ambulatory Visit: Payer: Managed Care, Other (non HMO)

## 2022-07-30 VITALS — BP 142/89 | HR 86 | Temp 98.3°F | Resp 18 | Ht 75.0 in | Wt 266.0 lb

## 2022-07-30 DIAGNOSIS — G6181 Chronic inflammatory demyelinating polyneuritis: Secondary | ICD-10-CM

## 2022-07-30 MED ORDER — SODIUM CHLORIDE 0.9 % IV SOLN
500.0000 mg | Freq: Once | INTRAVENOUS | Status: AC
  Administered 2022-07-30: 500 mg via INTRAVENOUS
  Filled 2022-07-30: qty 8

## 2022-07-30 NOTE — Progress Notes (Signed)
Diagnosis: CIDP  Provider:  Marshell Garfinkel MD  Procedure: Infusion  IV Type: Peripheral, IV Location: L Forearm  Solumedrol (Methylprednisolone), Dose: 500mg   Infusion Start Time: 1536  Infusion Stop Time: 1938  Post Infusion IV Care: Peripheral IV Discontinued  Discharge: Condition: Good, Destination: Home . AVS Declined  Performed by:  Cleophus Molt, RN

## 2022-07-31 ENCOUNTER — Ambulatory Visit: Payer: Managed Care, Other (non HMO) | Admitting: *Deleted

## 2022-07-31 VITALS — BP 138/85 | HR 95 | Temp 98.3°F | Resp 16 | Ht 75.0 in | Wt 266.4 lb

## 2022-07-31 DIAGNOSIS — G6181 Chronic inflammatory demyelinating polyneuritis: Secondary | ICD-10-CM | POA: Diagnosis not present

## 2022-07-31 MED ORDER — SODIUM CHLORIDE 0.9 % IV SOLN
500.0000 mg | Freq: Once | INTRAVENOUS | Status: AC
  Administered 2022-07-31: 500 mg via INTRAVENOUS
  Filled 2022-07-31: qty 8

## 2022-07-31 NOTE — Progress Notes (Signed)
  Diagnosis: CIPD  Provider:  Chilton Greathouse MD  Procedure: Infusion  IV Type: Peripheral, IV Location: R Forearm  Solumedrol (Methylprednisolone), Dose: 500 mg  Infusion Start Time: 1548  Infusion Stop Time: 1650  Post Infusion IV Care: Patient declined observation and Peripheral IV Discontinued  Discharge: Condition: Good, Destination: Home . AVS Declined  Performed by:  Evelena Peat, RN

## 2022-08-03 ENCOUNTER — Ambulatory Visit: Payer: Managed Care, Other (non HMO) | Admitting: Rehabilitative and Restorative Service Providers"

## 2022-08-03 ENCOUNTER — Encounter: Payer: Self-pay | Admitting: Rehabilitative and Restorative Service Providers"

## 2022-08-03 DIAGNOSIS — M6281 Muscle weakness (generalized): Secondary | ICD-10-CM

## 2022-08-03 DIAGNOSIS — R208 Other disturbances of skin sensation: Secondary | ICD-10-CM

## 2022-08-03 DIAGNOSIS — R278 Other lack of coordination: Secondary | ICD-10-CM

## 2022-08-03 DIAGNOSIS — R29818 Other symptoms and signs involving the nervous system: Secondary | ICD-10-CM

## 2022-08-10 ENCOUNTER — Ambulatory Visit: Payer: Managed Care, Other (non HMO) | Admitting: Rehabilitative and Restorative Service Providers"

## 2022-08-11 ENCOUNTER — Other Ambulatory Visit: Payer: Self-pay | Admitting: Neurology
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
## 2022-08-19 ENCOUNTER — Ambulatory Visit: Payer: Managed Care, Other (non HMO) | Admitting: Rehabilitative and Restorative Service Providers"

## 2022-08-19 ENCOUNTER — Encounter: Payer: Self-pay | Admitting: Rehabilitative and Restorative Service Providers"

## 2022-08-19 DIAGNOSIS — R29818 Other symptoms and signs involving the nervous system: Secondary | ICD-10-CM

## 2022-08-19 DIAGNOSIS — R208 Other disturbances of skin sensation: Secondary | ICD-10-CM

## 2022-08-19 DIAGNOSIS — M6281 Muscle weakness (generalized): Secondary | ICD-10-CM | POA: Diagnosis not present

## 2022-08-19 DIAGNOSIS — R278 Other lack of coordination: Secondary | ICD-10-CM

## 2022-08-25 ENCOUNTER — Ambulatory Visit: Payer: 59 | Admitting: Neurology

## 2022-08-27 ENCOUNTER — Encounter: Payer: Self-pay | Admitting: Neurology

## 2022-08-27 ENCOUNTER — Ambulatory Visit: Payer: 59 | Admitting: Medical

## 2022-08-27 ENCOUNTER — Ambulatory Visit: Payer: Managed Care, Other (non HMO)

## 2022-08-27 VITALS — BP 120/80 | HR 92 | Temp 98.6°F | Wt 265.2 lb

## 2022-08-27 DIAGNOSIS — M79644 Pain in right finger(s): Secondary | ICD-10-CM

## 2022-08-27 DIAGNOSIS — L03011 Cellulitis of right finger: Secondary | ICD-10-CM | POA: Diagnosis not present

## 2022-08-27 MED ORDER — FLUCONAZOLE 100 MG PO TABS: 100.0000 mg | ORAL_TABLET | Freq: Every day | ORAL | 0 refills | Status: DC

## 2022-08-27 MED ORDER — AMOXICILLIN-POT CLAVULANATE 875-125 MG PO TABS: 1.0000 | ORAL_TABLET | Freq: Two times a day (BID) | ORAL | 0 refills | Status: DC

## 2022-08-27 NOTE — Progress Notes (Signed)
Subjective:  Devin Erickson is a 52 y.o. male who presents for Chief Complaint  Patient presents with   right index finger    Injury about 4 weeks ago, right index finger- open wound and not healing and painful to touch things with     Here for right finger issue.  For the last 4 weeks he has had pain and redness and irritation at the tip of his right index finger.  No recent injury or trauma.  He does nail bite some.  No prior paronychia.  No fever.  No recent exposure with fingers in the ground or soil although his wife does do gardening.  He has been using Neosporin with no relief.  No other aggravating or relieving factors.    No other c/o.  The following portions of the patient's history were reviewed and updated as appropriate: allergies, current medications, past family history, past medical history, past social history, past surgical history and problem list.  ROS Otherwise as in subjective above  Objective: BP 120/80   Pulse 92   Temp 98.6 F (37 C)   Wt 265 lb 3.2 oz (120.3 kg)   BMI 33.15 kg/m   General appearance: alert, no distress, well developed, well nourished Right second finger distal with some erythema at the tip of the finger, seemingly some erythema under the nail and there is a dry line of purulent debris under the nail, there seems to be some subtle yellow-brown coloration of the distal nail compared to other fingernails but it is very subtle Tender of the distal fingertip and nailbed No obvious swelling or pus pocket is in paronychia Hands and fingers neurovascularly intact Fingers otherwise unremarkable    Assessment: Encounter Diagnoses  Name Primary?   Finger pain, right Yes   Infection of nail bed of finger of right hand      Plan: We discussed symptoms and findings.  We discussed possible etiology which could include bacterial infection, fungal infection, malignancy or other  I suspect is a combination of bacterial and fungal  infection  Begin medication as below, discussed good hygiene  Advise if not complete resolution within 10 to 14 days recheck.  If worse in the next few days call back or recheck.  Devin Erickson was seen today for right index finger.  Diagnoses and all orders for this visit:  Finger pain, right  Infection of nail bed of finger of right hand  Other orders -     amoxicillin-clavulanate (AUGMENTIN) 875-125 MG tablet; Take 1 tablet by mouth 2 (two) times daily. -     fluconazole (DIFLUCAN) 100 MG tablet; Take 1 tablet (100 mg total) by mouth daily.    Follow up: Call report 10 to 14 days

## 2022-08-28 ENCOUNTER — Ambulatory Visit: Payer: Managed Care, Other (non HMO)

## 2022-08-28 NOTE — Progress Notes (Signed)
Office Visit Note  Patient: Devin Erickson             Date of Birth: 12/19/1970           MRN: [id number]             PCP: Jac Canavan, PA-C Referring: Glendale Chard, DO Visit Date: 09/11/2022 Occupation: @GUAROCC @  Subjective:  Positive ANA and neuropathy  History of Present Illness: Devin Erickson is a 52 y.o. male seen in consultation per request of Dr. Allena Katz for positive ANA.  Patient symptoms started with mild weakness in her right arm in April 2023.  The symptoms gradually progressed.  He started dropping objects.  MRI of the cervical spine performed in July showed cervical spondylosis and multilevel foraminal stenosis.  In September nerve conduction velocity and EMG showed diffuse prolonged latency and slowed motor conduction velocity.  Dr. Allena Katz obtained extensive labs and his ANA was positive.  ENA panel was negative.  He had a spinal tap  in 04/2022 the CSF showed albuminocytologic dissociation suggestive of CIDP.  He was given Solu-Medrol 1 g for 5 days followed by 1 g every 28 days.  He was also placed on gabapentin 300 mg at bedtime.  He was referred to OT.  He has noticed some improvement in his right hand but the still very weak.  He states his right arm weakness is getting worse.  He notices some numbness in his bilateral hands in the morning.  He states his hands look pale in the morning.  He does not give typical symptoms of raynaud's phenominon.  He states gabapentin has been helping with discomfort.  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  At the time he was evaluated by rheumatologist and dermatologist and no diagnosis or treatment was given.  He denies any history of oral ulcers, nasal ulcers, malar rash.  He gives history of dry mouth, morning stiffness and muscle weakness only.  He denies typical Raynauds symptoms.  He has not noticed any skin tightness.  Patient denies any family history of autoimmune disease    Activities of Daily  Living:  Patient reports morning stiffness for 10 minutes.   Patient Denies nocturnal pain.  Difficulty dressing/grooming: Reports Difficulty climbing stairs: Denies Difficulty getting out of chair: Denies Difficulty using hands for taps, buttons, cutlery, and/or writing: Reports  Review of Systems  Constitutional:  Positive for fatigue.  HENT:  Positive for mouth dryness. Negative for mouth sores.   Eyes:  Negative for dryness.  Respiratory:  Negative for shortness of breath.   Cardiovascular:  Negative for chest pain and palpitations.  Gastrointestinal:  Negative for blood in stool, constipation and diarrhea.  Endocrine: Positive for increased urination.  Genitourinary:  Negative for involuntary urination.  Musculoskeletal:  Positive for muscle weakness and morning stiffness. Negative for joint pain, gait problem, joint pain, joint swelling, myalgias, muscle tenderness and myalgias.  Skin:  Positive for color change and rash. Negative for sensitivity to sunlight.       eczema  Allergic/Immunologic: Negative for susceptible to infections.  Neurological:  Positive for numbness. Negative for dizziness and headaches.  Hematological:  Negative for swollen glands.       2 occurrences per patient   Psychiatric/Behavioral:  Negative for depressed mood and sleep disturbance. The patient is not nervous/anxious.     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  Neuropathy, arm, right 01/20/2022   Radiculopathy, cervical region 10/15/2021   Left arm pain 06/18/2021   Upper back pain 06/18/2021   Decreased ROM of neck 06/18/2021   Screening for heart disease 02/05/2021   Screening for prostate cancer 02/05/2021   Sleep disturbance 02/05/2021   Chronic right-sided thoracic back pain 05/08/2020   Fatigue 07/10/2019   Raynaud's phenomenon without gangrene 07/10/2019   Erectile dysfunction  07/10/2019   At high risk for bleeding 07/10/2019   ANA positive 07/10/2019   Elevated liver function tests 03/08/2019   Need for influenza vaccination 02/16/2019   Vaccine counseling 02/16/2019   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   Chronic left shoulder pain 05/02/2018   Benign paroxysmal positional vertigo due to bilateral vestibular disorder 06/24/2017   Encounter for health maintenance examination in adult 07/22/2016   Chronic pain of right knee 07/22/2016   Atopic dermatitis 12/12/2015   Tension headache, chronic 10/04/2015   Cough variant asthma vs UACS 09/04/2015   Epistaxis 08/01/2014   Rhinitis, allergic 08/01/2014   Hyperlipidemia 08/01/2014   OSA (obstructive sleep apnea) 08/01/2014    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   History of sleep apnea 04/2012   per overnight screen/oximetry    Obesity    Thalassemia minor    Wears glasses    Wears glasses     Family History  Problem Relation Age of Onset   Cancer Mother 37       colon   Hypertension Mother    Dementia Father    Cancer Father        pancreas   Healthy Brother    Healthy Son    Heart disease Neg Hx    Lung disease Neg Hx    Past Surgical History:  Procedure Laterality Date   COLONOSCOPY  04/27/2010   [facility], normal per pt   TONSILLECTOMY     TONSILLECTOMY AND ADENOIDECTOMY     age 22yo   Social History   Social History Narrative   Married, son, [name] - Designer, television/film set, Set designer, exercise - going gym 4 days per week.  Jehoviah Witness.  01/2021         Right Handed    Lives in a one story home    Immunization History  Administered Date(s) Administered   Influenza Split 02/07/2009, 03/04/2010   Influenza,inj,Quad PF,6+ Mos 02/15/2012, 03/10/2013, 06/14/2014, 02/01/2015, 02/08/2017, 03/10/2018,  02/16/2019, 04/05/2020, 01/06/2021, 01/29/2022   Influenza-Unspecified 05/15/2016   Moderna Sars-Covid-2 Vaccination 07/13/2019, 08/10/2019   PFIZER Comirnaty(Gray Top)Covid-19 Tri-Sucrose Vaccine 08/23/2020   PFIZER(Purple Top)SARS-COV-2 Vaccination 03/02/2020   Pfizer Covid-19 Vaccine Bivalent Booster 13yrs & up 01/06/2021   Tdap 02/15/2012, 08/10/2012   Unspecified SARS-COV-2 Vaccination 01/29/2022   Zoster Recombinat (Shingrix) 06/08/2020, 08/07/2020     Objective: Vital Signs: BP 129/89 (BP Location: Left Arm, Patient Position: Sitting, Cuff Size: Large)   Pulse 79   Resp 17   Ht 6\' 3"  (1.905 m)   Wt 264 lb 6.4 oz (119.9 kg)   BMI 33.05 kg/m    Physical Exam Vitals and nursing note reviewed.  Constitutional:      Appearance: He is well-developed.  HENT:     Head: Normocephalic and atraumatic.  Eyes:     Conjunctiva/sclera: Conjunctivae normal.  Pupils: Pupils are equal, round, and reactive to light.  Cardiovascular:     Rate and Rhythm: Normal rate and regular rhythm.     Heart sounds: Normal heart sounds.  Pulmonary:     Effort: Pulmonary effort is normal.     Breath sounds: Normal breath sounds.  Abdominal:     General: Bowel sounds are normal.     Palpations: Abdomen is soft.  Musculoskeletal:     Cervical back: Normal range of motion and neck supple.  Skin:    General: Skin is warm and dry.     Capillary Refill: Capillary refill takes less than 2 seconds.     Comments: Sclerodactyly was noted distal to PIP joints.  Multiple telangiectasia were noted over the palmar aspect of both hands, neck and lower lip.  Neurological:     Mental Status: He is alert and oriented to person, place, and time.  Psychiatric:        Behavior: Behavior normal.      Musculoskeletal Exam: He had limited lateral rotation of the cervical spine.  Thoracic and lumbar spine were in good range of motion.  Shoulder joints, elbow joints, wrist joints with good range of motion.  He has  contractures in his right hand PIPs and DIP joints.  Left hand MCPs PIPs and DIPs been good range of motion with no synovitis.  Hip joints and knee joints were in good range of motion without any warmth swelling or effusion.  There was no tenderness over ankles or MTPs.    CDAI Exam: CDAI Score: -- Patient Global: --; Provider Global: -- Swollen: --; Tender: -- Joint Exam 09/11/2022   No joint exam has been documented for this visit   There is currently no information documented on the homunculus. Go to the Rheumatology activity and complete the homunculus joint exam.  Investigation: No additional findings.  Imaging: No results found.  Recent Labs: Lab Results  Component Value Date   WBC 5.1 02/05/2021   HGB 13.0 02/05/2021   PLT 224 02/05/2021   NA 140 02/05/2021   K 4.0 02/05/2021   CL 103 02/05/2021   CO2 20 02/05/2021   GLUCOSE 88 02/05/2021   BUN 23 02/05/2021   CREATININE 0.98 02/05/2021   BILITOT 0.6 02/05/2021   ALKPHOS 49 02/05/2021   AST 45 (H) 02/05/2021   ALT 32 02/05/2021   PROT 7.4 02/18/2022   ALBUMIN 4.2 05/26/2022   CALCIUM 9.4 02/05/2021   GFRAA 84 02/16/2019   November 18, 2021 RPR negative, HIV negative, hepatitis C nonreactive, ESR 23 February 18, 2022 ANA 1: 640 centromere, ENA (double-stranded DNA, RNP, Smith, SCL 70, SSA, SSB) negative, ESR 16, cryoglobulins negative, CRP normal,  ANCA negative May 26, 2022 ACE 5    October 27/2020 CK 1719 Speciality Comments: No specialty comments available.  Procedures:  No procedures performed Allergies: Patient has no known allergies.   Assessment / Plan:     Visit Diagnoses: Limited systemic sclerosis (HCC)-patient denies any history of skin tightness.  He gives history of mild Raynaud's symptoms especially in the morning.  Skin tightness was noted distal to PIP joints.  Extensive telangiectasia were noted on bilateral hands.  Few Telangiectesia were noted on his neck and lower lip.  Nailbed capillary  changes with dilated blood vessels and capillary dropout was noted.  Patient denies any shortness of breath.  Detailed counsel regarding scleroderma was provided.  I will obtain high-resolution CT to evaluate for ILD.  I will also refer  him to cardiology to get echocardiogram and evaluation.  Detailed counseling regarding scleroderma was provided.  Monitoring of blood pressure at home was also discussed.  I will refer him to [facility] rheumatology.  Monitoring blood pressure closely was advised especially while he is on steroids.  Increased risk of renal crisis on prednisone was also discussed.  I called Dr. Allena Katz and discussed there is cough prednisone use in patients with systemic sclerosis.  Dr. Allena Katz plans to switch him from prednisone to IVIG for the treatment of CIDP.  ANA positive -patient is positive ANA 1: 640 centromere pattern which is seen in limited systemic sclerosis.  His symptoms are consistent with limited systemic sclerosis.  Will obtain additional labs today.  Plan: Protein / creatinine ratio, urine, ANA, Anti-scleroderma antibody, Anti-DNA antibody, double-stranded, RNP Antibody, Anti-Smith antibody, C3 and C4, CBC with Differential/Platelet, COMPLETE METABOLIC PANEL WITH GFR  Raynaud's phenomenon without gangrene-patient gives history of mild Raynaud's phenomenon.  He states his symptoms are prominent only in the morning.  He does not recall his symptoms getting worse during winter months.   He had good capillary refill and no digital ulcers were noted.  Elevated CK -patient had elevated CK at 1719 in October 2020.  I could not find any more CK's after that.  I will repeat CK today however patient has been receiving prednisone.  Plan: CK, Aldolase, 3-Hydroxy-3-Methylglutaryl-Coenzyme A Reductase (HMGCR) AB (IgG), Myositis Specific II Antibodies Panel.  I had a discussion with Dr. Allena Katz today.  She states the EMG nerve conduction velocities were not consistent with myositis.  DDD (degenerative  disc disease), cervical-he had MRI of the cervical spine on November 03, 2021 which showed cervical spondylosis.  He does not have much discomfort in the cervical spine.  CIDP (chronic inflammatory demyelinating polyneuropathy) (HCC)-patient was diagnosed with CIDP based on EMG, nerve conduction and a spinal tap by Dr. Allena Katz.  He has been on IV Solu-Medrol since November 24, 2021.  His last dose was on June 08, 2022.  Patient has not noticed any significant improvement on Solu-Medrol.  He is also going to occupational therapy.  Alopecia totalis-patient developed alopecia totalis about 8 years ago.  At that time he was seen by rheumatologist and dermatologist.  He states no treatment was offered.  Other atopic dermatitis-he gets intermittent eczema.  No active lesions were noted today.  Elevated liver function tests-his LFTs were elevated in 2020.  Mild elevation of LFTs were noted in 2022.  I will check labs today.  Mixed hyperlipidemia-patient is on Crestor 20 mg p.o. daily.  Allergic rhinitis, unspecified seasonality, unspecified trigger  Cough variant asthma vs UACS  Orders: Orders Placed This Encounter  Procedures   CT Chest High Resolution   CK   Aldolase   3-Hydroxy-3-Methylglutaryl-Coenzyme A Reductase (HMGCR) AB (IgG)   Myositis Specific II Antibodies Panel   Protein / creatinine ratio, urine   ANA   Anti-scleroderma antibody   Anti-DNA antibody, double-stranded   RNP Antibody   Anti-Smith antibody   C3 and C4   CBC with Differential/Platelet   COMPLETE METABOLIC PANEL WITH GFR   Ambulatory referral to Cardiology   Ambulatory referral to Rheumatology   No orders of the defined types were placed in this encounter.   Face-to-face time spent with patient was 50 minutes. Greater than 50% of time was spent in counseling and coordination of care.  Total time spent was over 90 minutes.  Follow-Up Instructions: Return for Systemic sclerosis.   Pollyann Savoy, MD  Note -  This record has been created using [employer].  Chart creation errors have been sought, but may not always  have been located. Such creation errors do not reflect on  the standard of medical care.

## 2022-08-31 ENCOUNTER — Ambulatory Visit (INDEPENDENT_AMBULATORY_CARE_PROVIDER_SITE_OTHER): Payer: Managed Care, Other (non HMO)

## 2022-08-31 VITALS — BP 144/87 | HR 88 | Temp 98.3°F | Resp 16 | Ht 75.0 in | Wt 265.6 lb

## 2022-08-31 DIAGNOSIS — G6181 Chronic inflammatory demyelinating polyneuritis: Secondary | ICD-10-CM

## 2022-08-31 MED ORDER — SODIUM CHLORIDE 0.9 % IV SOLN
500.0000 mg | Freq: Once | INTRAVENOUS | Status: AC
  Administered 2022-08-31: 500 mg via INTRAVENOUS
  Filled 2022-08-31: qty 8

## 2022-08-31 NOTE — Progress Notes (Cosign Needed Addendum)
Diagnosis:   CIDP (chronic inflammatory demyelinating polyneuropathy)    Provider:  Chilton Greathouse MD  Procedure: IV Infusion  IV Type: Peripheral, IV Location: R Hand  Solumedrol (Methylprednisolone), Dose: 500 mg  Infusion Start Time: 1544  Infusion Stop Time: 1641  Post Infusion IV Care: Peripheral IV Discontinued  Discharge: Condition: Good, Destination: Home . AVS Declined  Performed by:  Loney Hering, LPN

## 2022-09-01 ENCOUNTER — Ambulatory Visit: Payer: Managed Care, Other (non HMO)

## 2022-09-04 ENCOUNTER — Ambulatory Visit: Payer: Managed Care, Other (non HMO)

## 2022-09-04 VITALS — BP 136/89 | HR 78 | Temp 98.3°F | Resp 18 | Ht 75.0 in | Wt 266.0 lb

## 2022-09-04 DIAGNOSIS — G6181 Chronic inflammatory demyelinating polyneuritis: Secondary | ICD-10-CM | POA: Diagnosis not present

## 2022-09-04 MED ORDER — SODIUM CHLORIDE 0.9 % IV SOLN
500.0000 mg | Freq: Once | INTRAVENOUS | Status: AC
  Administered 2022-09-04: 500 mg via INTRAVENOUS
  Filled 2022-09-04: qty 8

## 2022-09-04 NOTE — Progress Notes (Signed)
Diagnosis: CIDP  Provider:  Chilton Greathouse MD  Procedure: IV Infusion  IV Type: Peripheral, IV Location: L Hand  Solumedrol (Methylprednisolone), Dose: 500 mg  Infusion Start Time: 1605  Infusion Stop Time: 1704  Post Infusion IV Care: Peripheral IV Discontinued  Discharge: Condition: Good, Destination: Home . AVS Declined  Performed by:  Nat Math, RN

## 2022-09-09 ENCOUNTER — Ambulatory Visit: Payer: Managed Care, Other (non HMO) | Attending: Neurology | Admitting: Occupational Therapy

## 2022-09-09 DIAGNOSIS — M6281 Muscle weakness (generalized): Secondary | ICD-10-CM | POA: Insufficient documentation

## 2022-09-09 DIAGNOSIS — R29818 Other symptoms and signs involving the nervous system: Secondary | ICD-10-CM | POA: Insufficient documentation

## 2022-09-09 DIAGNOSIS — R278 Other lack of coordination: Secondary | ICD-10-CM | POA: Diagnosis present
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
## 2022-09-11 ENCOUNTER — Ambulatory Visit: Payer: Managed Care, Other (non HMO) | Attending: Rheumatology | Admitting: Rheumatology

## 2022-09-11 ENCOUNTER — Telehealth: Payer: Self-pay | Admitting: Anesthesiology

## 2022-09-11 ENCOUNTER — Encounter: Payer: Self-pay | Admitting: Rheumatology

## 2022-09-11 VITALS — BP 129/89 | HR 79 | Resp 17 | Ht 75.0 in | Wt 264.4 lb

## 2022-09-11 DIAGNOSIS — G44229 Chronic tension-type headache, not intractable: Secondary | ICD-10-CM

## 2022-09-11 DIAGNOSIS — M5412 Radiculopathy, cervical region: Secondary | ICD-10-CM

## 2022-09-11 DIAGNOSIS — L659 Nonscarring hair loss, unspecified: Secondary | ICD-10-CM

## 2022-09-11 DIAGNOSIS — R748 Abnormal levels of other serum enzymes: Secondary | ICD-10-CM | POA: Diagnosis not present

## 2022-09-11 DIAGNOSIS — M349 Systemic sclerosis, unspecified: Secondary | ICD-10-CM

## 2022-09-11 DIAGNOSIS — R768 Other specified abnormal immunological findings in serum: Secondary | ICD-10-CM | POA: Diagnosis not present

## 2022-09-11 DIAGNOSIS — L630 Alopecia (capitis) totalis: Secondary | ICD-10-CM

## 2022-09-11 DIAGNOSIS — G4733 Obstructive sleep apnea (adult) (pediatric): Secondary | ICD-10-CM

## 2022-09-11 DIAGNOSIS — G8929 Other chronic pain: Secondary | ICD-10-CM

## 2022-09-11 DIAGNOSIS — J309 Allergic rhinitis, unspecified: Secondary | ICD-10-CM

## 2022-09-11 DIAGNOSIS — J45991 Cough variant asthma: Secondary | ICD-10-CM

## 2022-09-11 DIAGNOSIS — H8113 Benign paroxysmal vertigo, bilateral: Secondary | ICD-10-CM

## 2022-09-11 DIAGNOSIS — L2089 Other atopic dermatitis: Secondary | ICD-10-CM

## 2022-09-11 DIAGNOSIS — M5030 Other cervical disc degeneration, unspecified cervical region: Secondary | ICD-10-CM

## 2022-09-11 DIAGNOSIS — I7300 Raynaud's syndrome without gangrene: Secondary | ICD-10-CM

## 2022-09-11 DIAGNOSIS — G479 Sleep disorder, unspecified: Secondary | ICD-10-CM

## 2022-09-11 DIAGNOSIS — R7689 Other specified abnormal immunological findings in serum: Secondary | ICD-10-CM

## 2022-09-11 DIAGNOSIS — G6181 Chronic inflammatory demyelinating polyneuritis: Secondary | ICD-10-CM

## 2022-09-11 DIAGNOSIS — R7989 Other specified abnormal findings of blood chemistry: Secondary | ICD-10-CM

## 2022-09-11 DIAGNOSIS — E782 Mixed hyperlipidemia: Secondary | ICD-10-CM

## 2022-09-11 NOTE — Telephone Encounter (Signed)
Rheumatology office left a message with AN stating Dr Corliss Skains would like to speak to Dr Allena Katz about pt. Call back number is [phone].

## 2022-09-11 NOTE — Patient Instructions (Signed)
Scleroderma Scleroderma is a rare and long-term (chronic) disease of the immune system. The immune system protects the body by fighting germs that cause illness. If you have scleroderma, your immune system mistakenly attacks your skin and other parts of your body. This is called an autoimmune disease. Scleroderma means hardening of the skin. If you have a mild form of this condition, it may affect only your skin (localized scleroderma). If you have a severe form, it may affect your skin and also your blood vessels, lungs, kidneys, heart, and digestive system (systemic scleroderma). What are the causes? The cause of this condition is not known. What increases the risk? You are more likely to develop this condition if: You have a family history of scleroderma. You are male. You are 60-60 years old. What are the signs or symptoms? Symptoms of this condition depend on the type of scleroderma you have. They also vary from person to person. Symptoms may include the following: Symptoms of localized scleroderma Discolored patches of skin (morphea). These may be thick and waxy. Bands of thick, hard skin on your arms, legs, or face (linear scleroderma). Tightening of the skin that limits how well you can move your joints. Open skin sores. Symptoms of systemic scleroderma Skin problems, such as: Puffy hands and fingers. Tightening of the skin of the fingers, hands, arms, neck, and face. Enlarged blood vessels of the hands, face, and nail beds (telangiectasias). Calcium spots under your skin (calcinosis). Problems of the heart and blood vessels, such as: Discoloring of the fingers and sometimes the toes (Raynaud phenomenon). Your fingers or toes may turn blue, white, or red. You may also have tingling or numbness, often from being exposed to cold. High blood pressure. Problems of the muscles and bones, such as: Joint pain. Muscle pain and swelling. Problems of the digestive system, such  as: Trouble swallowing (dysphagia). Heartburn. Constipation. Diarrhea. Lung problems, such as coughing or trouble breathing. How is this diagnosed? This condition may be diagnosed based on: Your symptoms and medical history. A physical exam. Tests, such as: Imaging studies. These may include X-rays and a CT scan. Blood tests. Lung (pulmonary) function tests. Scleroderma can be hard to diagnose because other diseases have many of the same symptoms. You may need to see a team of specialists. How is this treated? There is no cure for this condition, but treatment can relieve symptoms and prevent complications. Mild symptoms may not need treatment. Treatment may include taking medicines to: Improve blood flow. Block production of stomach acid, to treat heartburn. Treat high blood pressure caused by kidney disease. Relieve joint pain and inflammation. Treat lung symptoms. Follow these instructions at home:  Medicines Take over-the-counter and prescription medicines only as told by your health care provider. Ask your health care provider if the medicine prescribed to you requires you to avoid driving or using machinery. Eating and drinking Eat a healthy diet. Drink enough fluid to keep your urine pale yellow. If you have heartburn: Eat smaller meals often. Avoid spicy and fatty foods. Do not eat meals late in the evening. Avoid lying down right after you eat. Lifestyle  If you have Raynaud phenomenon, cold can trigger your symptoms. To prevent symptoms, you can: Wear mittens, a hat, a scarf, and warm footwear. Dress in layers during cold weather. If possible, stay indoors during cold weather. Wear comfortable shoes that are well cushioned. Protect your skin with sunscreen and moisturizers. Stretch and get regular exercise. Maintain a healthy weight. General instructions Learn as  much as you can about scleroderma, and work closely with your team of health care providers. Do  not use any products that contain nicotine or tobacco. These products include cigarettes, chewing tobacco, and vaping devices, such as e-cigarettes. If you need help quitting, ask your health care provider. Ask your health care provider how to check your blood pressure at home. Check it as told by your health care provider. Make sure you have a good support system at home. Practice stress management techniques. These include: Relaxation techniques, such as breathing or muscle relaxation exercises. Exercise. Regular activity can lower your stress level. Changing what you can change and accepting what you cannot change. Use skin moisturizer to keep your skin moist. Keep all follow-up visits. Your health care provider will watch your condition over time to check if it is getting worse and will change your treatment plan as needed. Contact a health care provider if: Your scleroderma symptoms change or get worse. You have concerns about your mental health. Get help right away if: A finger or toe becomes painful or numb. A finger or toe turns black or a very dark color. You have: Swelling, pain, or tenderness in an arm or leg. Trouble swallowing. Trouble breathing. Chest pain. You cough up blood. Summary Scleroderma is a rare and long-term (chronic) disease of the immune system. If you have scleroderma, your immune system mistakenly attacks your skin and other parts of your body. Symptoms of this condition depend on the type of scleroderma you have. They also vary from person to person. There is no cure for this condition, but treatment can relieve symptoms and prevent complications. This information is not intended to replace advice given to you by your health care provider. Make sure you discuss any questions you have with your health care provider. Document Revised: 07/04/2021 Document Reviewed: 07/04/2021 Elsevier Patient Education  2023 [employer].

## 2022-09-11 NOTE — Telephone Encounter (Signed)
Call has been returned. 

## 2022-09-12 LAB — CBC WITH DIFFERENTIAL/PLATELET
Basophils Absolute: 51 cells/uL (ref 0–200)
Basophils Relative: 1 %
Eosinophils Absolute: 408 cells/uL (ref 15–500)
Eosinophils Relative: 8 %
HCT: 38.1 % — ABNORMAL LOW (ref 38.5–50.0)
Lymphs Abs: 1586 cells/uL (ref 850–3900)
Neutrophils Relative %: 47.1 %
RBC: 5.7 10*6/uL (ref 4.20–5.80)

## 2022-09-12 LAB — PROTEIN / CREATININE RATIO, URINE: Protein/Creat Ratio: 57 mg/g creat (ref 25–148)

## 2022-09-12 LAB — CK: Total CK: 227 U/L — ABNORMAL HIGH (ref 44–196)

## 2022-09-12 LAB — ANTI-DNA ANTIBODY, DOUBLE-STRANDED: ds DNA Ab: 1 IU/mL

## 2022-09-12 LAB — ANTI-SMITH ANTIBODY: ENA SM Ab Ser-aCnc: <1 AI

## 2022-09-14 LAB — COMPLETE METABOLIC PANEL WITH GFR
AG Ratio: 1.7 (calc) (ref 1.0–2.5)
Albumin: 4.2 g/dL (ref 3.6–5.1)
BUN: 20 mg/dL (ref 7–25)
CO2: 28 mmol/L (ref 20–32)
Potassium: 4.3 mmol/L (ref 3.5–5.3)
Sodium: 140 mmol/L (ref 135–146)

## 2022-09-14 LAB — PROTEIN / CREATININE RATIO, URINE: Total Protein, Urine: 9 mg/dL (ref 5–25)

## 2022-09-14 LAB — CBC MORPHOLOGY

## 2022-09-14 LAB — RNP ANTIBODY: Ribonucleic Protein(ENA) Antibody, IgG: <1 AI

## 2022-09-14 LAB — C3 AND C4
C3 Complement: 106 mg/dL (ref 82–185)
C4 Complement: 26 mg/dL (ref 15–53)

## 2022-09-14 LAB — CBC WITH DIFFERENTIAL/PLATELET
MCHC: 32 g/dL (ref 32.0–36.0)
Platelets: 228 10*3/uL (ref 140–400)

## 2022-09-15 ENCOUNTER — Ambulatory Visit (INDEPENDENT_AMBULATORY_CARE_PROVIDER_SITE_OTHER): Payer: Managed Care, Other (non HMO) | Admitting: Neurology

## 2022-09-15 ENCOUNTER — Encounter: Payer: Self-pay | Admitting: Neurology

## 2022-09-15 VITALS — BP 148/83 | HR 108 | Ht 75.0 in | Wt 268.0 lb

## 2022-09-15 DIAGNOSIS — G6181 Chronic inflammatory demyelinating polyneuritis: Secondary | ICD-10-CM | POA: Diagnosis not present

## 2022-09-15 DIAGNOSIS — R937 Abnormal findings on diagnostic imaging of other parts of musculoskeletal system: Secondary | ICD-10-CM

## 2022-09-15 DIAGNOSIS — M5412 Radiculopathy, cervical region: Secondary | ICD-10-CM

## 2022-09-15 MED ORDER — GABAPENTIN 300 MG PO CAPS: 300.0000 mg | ORAL_CAPSULE | Freq: Two times a day (BID) | ORAL | 5 refills | Status: DC

## 2022-09-15 NOTE — Patient Instructions (Addendum)
Increase gabapentin to 300mg  twice daily  Stop Solumedrol  Start IVIG  MRI cervical spine wwo contrast  Return to clinic 3 months

## 2022-09-15 NOTE — Progress Notes (Signed)
Follow-up Visit   Date: 09/15/2022    Devin Erickson MRN: [id number] DOB: 1970/07/17    Devin Erickson is a 52 y.o. right-handed African American male with hyperlipidemia and anxiety returning to the clinic for follow-up of polyradiculoneuropathy.  The patient was accompanied to the clinic by self.   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  - D/C solumedrol - Start IVIG 2g/kg over 5 days, then 1g/kg every 28 days - Increase gabapentin 300mg  to twice daily - Continue occupational therapy  2.  Serology testing shows positive ANA with centromere antibody titer of 1:640.  Unsure if this is due to systemic illness or consistent with specific autoimmune condition. Rheumatology evaluation is pending.   Return to clinic in 2 months, or sooner as needed  --------------------------------------------- History of present illness: Starting around April 2023, he began noticing mild weakness in the arm especially when weight lifting, it was more effortful on the right side.  Over the summer, he noticed weakness in grip on the right hand which continued to progress to the point where he is unable to use his hand to crank his ignition, turning keys to entire his home, grasping objects, and using utensils.  He is dropping things frequently.  He has difficulty straightening the hand.  He denies weakness with raising the arm or bending at the elbow.  He has mild pain in the right shoulder, described as a nagging pain.  He treated it with tylenol.  Occasionally, he has numbness over the tips of the fingers.     He denies similar symptoms in the left hand.  No numbness/tingling in the feet.  He denies any ongoing pain or neck pain.  MRI cervical spine was performed in July which shows cervical  spondylosis and multilevel foraminal stenosis at right C3-C4, bilateral C4-5 and left C6-7. Additionally, there was note of edema signal in the C6 and C7 spinous process as well as interspinous ligaments at C5-6, C6-7, C7-T1, and T1-T2.     NCS/EMG performed in September shows diffuse prolonged latency, slowed motor conduction velocity, and reduced amplitude involving the median and ulnar nerves.  Needle EMG shows active fibrillation potentials in the FDI, ABP, EDC, and biceps muscles.  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 Labs indicate positive ANA.   UPDATE 03/25/2022:  He is here to discuss results of EMG which favors a polyradiculoneuropathy affecting both arms, worse on the right.  He reports having discomfort in the left shoulder, similar to what he experienced in the beginning on the right.  No numbness/tinging of the left arm.  His right hand remains unchanged and continues to have weakness.  He has difficulty with fine motor tasks.   UPDATE 06/01/2022:  He is here for follow-up visit and discuss LP results.  There has been no significant change with his hand weakness or tingling, which remains worse in the right hand.  No similar symptoms in the legs or imbalance.  LP shows elevated protein of 116, normal white count and presence of oligoclonal bands.  IgG is normal.   UPDATE 09/15/2022:  He is here for follow-up visit.  Since his last visit, he continued to have additional monthly solumedrol and reports ***.  He  was evaluated by Dr. 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    He is having mild tingling in the left hand and has noticed some weakness.   Medications:  Current Outpatient Medications on File Prior to Visit  Medication Sig Dispense Refill   albuterol (VENTOLIN  HFA) 108 (90 Base) MCG/ACT inhaler Inhale 2 puffs into the lungs every 6 (six) hours as needed for wheezing or shortness of breath. 8 g 0   cetirizine (ZYRTEC) 10 MG tablet Take 10 mg by mouth daily.     gabapentin (NEURONTIN) 300 MG capsule TAKE 1 CAPSULE BY MOUTH EVERYDAY AT BEDTIME 30 capsule 3   Multiple Vitamin (MULTIVITAMIN) tablet Take 1 tablet by mouth daily.     rosuvastatin (CRESTOR) 20 MG tablet TAKE 1 TABLET BY MOUTH EVERYDAY AT BEDTIME 90 tablet 3   No current facility-administered medications on file prior to visit.    Allergies: No Known Allergies  Vital Signs:  BP (!) 148/83   Pulse (!) 108   Ht 6\' 3"  (1.905 m)   Wt 268 lb (121.6 kg)   SpO2 96%   BMI 33.50 kg/m   Neurological Exam: MENTAL STATUS including orientation to time, place, person, recent and remote memory, attention span and concentration, language, and fund of knowledge is normal.  Speech is not dysarthric.  CRANIAL NERVES:   Normal conjugate, extra-ocular eye movements in all directions of gaze.  No ptosis.  Face is symmetric. Palate elevates symmetrically.  Tongue is midline.  MOTOR:  Right FDI, ADM, ABP, and medial forearm atrophy.  No fasciculations or abnormal movements.  No pronator drift.    Upper Extremity:  Right   Left  Deltoid  5/5    5/5   Biceps  5-/5    5/5   Triceps  5/5    5/5   Wrist extensors  5/5    5/5   Wrist flexors  5-/5    5/5   Finger extensors  3/5    5/5   Finger flexors  4/5    5/5   Dorsal interossei  3/5    5-/5   Abductor pollicis  3/5    5/5   Tone (Ashworth scale)  0   0    Lower Extremity:  Right   Left  Hip flexors  5/5    5/5   Knee flexors  5/5    5/5   Dorsiflexors  5/5    5/5   Plantarflexors  5/5    5/5   Toe extensors  5/5    5/5   Toe flexors  5/5    5/5   Tone (Ashworth scale)  0   0    MSRs:                                              Right        Left brachioradialis tr   1+  biceps tr   1+  triceps tr   1+  patellar 1+   1+  ankle jerk 1+    1+  Hoffman no   no  plantar response down   down    SENSORY:  Reduced pin prick and temperature in the right palm, compared to the left.  Sensation intact in the legs.     COORDINATION/GAIT: Normal  finger-to- nose-finger.  Finger tapping slowed on the right due to weakness.   Gait narrow based and stable. Tandem and stressed gait intact.   Data: Labs 02/18/2022:  vitamin B12 880, folate > 23, CRP <1.0, ESR 16, TSH 1.65, ANCA neg, ACE 33, copper 110, SPEP with IFE no M protein, cryo neg, vitamin B1 7*, ANA positive, centromere antibody 1:640*  NCS/EMG of bilateral arms 03/24/2022 performed at [facility]: This is a complex study.  Findings are most suggestive of a subacute demyelinating and axonal polyradiculoneuropathy affecting the upper extremities, which is worse on the right.    NCS/EMG of the right arm 01/08/2022 performed at [facility]: EMG & NCV Findings:  Impression: The above electrodiagnostic study is ABNORMAL but is difficult to fully interpret.  The needle EMG shows significant denervation but active motor unit potentials in multiple muscles which could be consistent with cervical stenosis but the MRI does not show any cervical stenosis.  This could be a brachial plexus type lesion although it does not seem to fit to a specific trauma although it is closest to an upper trunk brachial plexus lesion.  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    However the patient seems to have a concomitant median neuropathy at the wrist but also slowing of the ulnar nerve distal to the elbow and this would represent more of a demyelination type lesion on top of what was seen with the denervation potentials.     MRI cervical spine wo contrast 11/03/21: Intermittently motion degraded exam.   Indeterminate 18 mm T2 STIR hyperintense and T1 hypointense lesion within the T1 vertebral body. While this may reflect an atypical hemangioma, alternative etiologies (including osseous metastatic  disease) cannot be excluded. Short-interval 2-3 month MRI follow-up without and with contrast is recommended to monitor this finding.   Marrow edema within the C6 and C7 spinous processes. Edema signal also present within the interspinous ligaments at C5-C6, C6-C7, C7-T1 and T1-T2. Findings are nonspecific, but possibly degenerative or posttraumatic in etiology. If there has been recent trauma, the interspinous ligament edema could reflect interspinous ligament injury and a cervical spine CT should be considered to exclude C6/C7 spinous process fractures. Additionally, attention to these sites recommended at MRI imaging follow-up.   Cervical spondylosis, as outlined. No more than mild relative spinal canal narrowing. Multilevel foraminal stenosis, as detailed and greatest on the right at C3-C4 (moderate), bilaterally at C4-C5 (mild-to-moderate) and on the left at C6-C7 (mild-to-moderate).   Nonspecific straightening of the expected cervical lordosis.  CSF 05/26/2022:  R0 W1 P116*  G56   ACE 5, IgG index 0.54, MBP pending, OCB present*, cytology negative    Thank you for allowing me to participate in patient's care.  If I can answer any additional questions, I would be pleased to do so.    Sincerely,    Nalanie Winiecki K. Allena Katz, DO /

## 2022-09-15 NOTE — Progress Notes (Signed)
FYI.  I will forward to the labs when they are available.

## 2022-09-16 ENCOUNTER — Encounter: Payer: Self-pay | Admitting: Neurology

## 2022-09-16 ENCOUNTER — Other Ambulatory Visit: Payer: Self-pay | Admitting: Pharmacy Technician

## 2022-09-16 ENCOUNTER — Ambulatory Visit: Payer: Managed Care, Other (non HMO) | Admitting: Occupational Therapy

## 2022-09-22 NOTE — Progress Notes (Deleted)
Office Visit Note  Patient: Devin Erickson             Date of Birth: 1971/04/04           MRN: [id number]             PCP: Jac Canavan, PA-C Referring: Jac Canavan, PA-C Visit Date: 10/05/2022 Occupation: @GUAROCC @  Subjective:  No chief complaint on file.   History of Present Illness: Devin Erickson is a 52 y.o. male ***     Activities of Daily Living:  Patient reports morning stiffness for *** {minute/hour:19697}.   Patient {ACTIONS;DENIES/REPORTS:21021675::"Denies"} nocturnal pain.  Difficulty dressing/grooming: {ACTIONS;DENIES/REPORTS:21021675::"Denies"} Difficulty climbing stairs: {ACTIONS;DENIES/REPORTS:21021675::"Denies"} Difficulty getting out of chair: {ACTIONS;DENIES/REPORTS:21021675::"Denies"} Difficulty using hands for taps, buttons, cutlery, and/or writing: {ACTIONS;DENIES/REPORTS:21021675::"Denies"}  No Rheumatology ROS completed.   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   Neuropathy, arm, right 01/20/2022   Radiculopathy, cervical region 10/15/2021   Left arm pain 06/18/2021   Upper back pain 06/18/2021   Decreased ROM of neck 06/18/2021   Screening for heart disease 02/05/2021   Screening for prostate cancer 02/05/2021   Sleep disturbance 02/05/2021   Chronic right-sided thoracic back pain 05/08/2020   Fatigue 07/10/2019   Raynaud's phenomenon without gangrene 07/10/2019   Erectile dysfunction 07/10/2019   At high risk for bleeding 07/10/2019   ANA positive 07/10/2019   Elevated liver function tests 03/08/2019   Need for influenza vaccination 02/16/2019   Vaccine counseling 02/16/2019   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   Chronic left shoulder pain 05/02/2018   Benign paroxysmal positional vertigo due to bilateral vestibular disorder 06/24/2017   Encounter for health maintenance examination in adult 07/22/2016   Chronic  pain of right knee 07/22/2016   Atopic dermatitis 12/12/2015   Tension headache, chronic 10/04/2015   Cough variant asthma vs UACS 09/04/2015   Epistaxis 08/01/2014   Rhinitis, allergic 08/01/2014   Hyperlipidemia 08/01/2014   OSA (obstructive sleep apnea) 08/01/2014    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   History of sleep apnea 04/2012   per overnight screen/oximetry    Obesity    Thalassemia minor    Wears glasses    Wears glasses     Family History  Problem Relation Age of Onset   Cancer Mother 15       colon   Hypertension Mother    Dementia Father    Cancer Father        pancreas   Healthy Brother    Healthy Son    Heart disease Neg Hx    Lung disease Neg Hx    Past Surgical History:  Procedure Laterality Date   COLONOSCOPY  04/27/2010   [facility], normal per pt   TONSILLECTOMY     TONSILLECTOMY AND ADENOIDECTOMY     age 23yo   Social History   Social History Narrative   Married, son, [name] - Designer, television/film set, Set designer, exercise - going gym 4 days per week.  Jehoviah Witness.  01/2021         Right Handed    Lives in a one story home    Immunization History  Administered Date(s) Administered   Influenza Split 02/07/2009,  03/04/2010   Influenza,inj,Quad PF,6+ Mos 02/15/2012, 03/10/2013, 06/14/2014, 02/01/2015, 02/08/2017, 03/10/2018, 02/16/2019, 04/05/2020, 01/06/2021, 01/29/2022   Influenza-Unspecified 05/15/2016   Moderna Sars-Covid-2 Vaccination 07/13/2019, 08/10/2019   PFIZER Comirnaty(Gray Top)Covid-19 Tri-Sucrose Vaccine 08/23/2020   PFIZER(Purple Top)SARS-COV-2 Vaccination 03/02/2020   Pfizer Covid-19 Vaccine Bivalent Booster 28yrs & up 01/06/2021   Tdap 02/15/2012, 08/10/2012   Unspecified SARS-COV-2 Vaccination 01/29/2022   Zoster Recombinat (Shingrix)  06/08/2020, 08/07/2020     Objective: Vital Signs: There were no vitals taken for this visit.   Physical Exam   Musculoskeletal Exam: ***  CDAI Exam: CDAI Score: -- Patient Global: --; Provider Global: -- Swollen: --; Tender: -- Joint Exam 10/05/2022   No joint exam has been documented for this visit   There is currently no information documented on the homunculus. Go to the Rheumatology activity and complete the homunculus joint exam.  Investigation: No additional findings.  Imaging: No results found.  Recent Labs: Lab Results  Component Value Date   WBC 5.1 09/11/2022   HGB 12.2 (L) 09/11/2022   PLT 228 09/11/2022   NA 140 09/11/2022   K 4.3 09/11/2022   CL 104 09/11/2022   CO2 28 09/11/2022   GLUCOSE 93 09/11/2022   BUN 20 09/11/2022   CREATININE 1.16 09/11/2022   BILITOT 0.6 09/11/2022   ALKPHOS 49 02/05/2021   AST 25 09/11/2022   ALT 19 09/11/2022   PROT 6.7 09/11/2022   ALBUMIN 4.2 05/26/2022   CALCIUM 9.6 09/11/2022   GFRAA 84 02/16/2019   Sep 11, 2022 urine protein creatinine ratio normal, ANA 1: 1280 centromere, 1: 80 cytoplasmic, dsDNA negative, RNP negative, Smith negative, SCL 70 negative, C3-C4 normal, CK227, aldolase 5.2  February 16, 2019 CK 1719   Speciality Comments: No specialty comments available.  Procedures:  No procedures performed Allergies: Patient has no known allergies.   Assessment / Plan:     Visit Diagnoses: No diagnosis found.  Orders: No orders of the defined types were placed in this encounter.  No orders of the defined types were placed in this encounter.   Face-to-face time spent with patient was *** minutes. Greater than 50% of time was spent in counseling and coordination of care.  Follow-Up Instructions: No follow-ups on file.   Pollyann Savoy, MD  Note - This record has been created using Animal nutritionist.  Chart creation errors have been sought, but may not always  have been located. Such creation  errors do not reflect on  the standard of medical care.

## 2022-09-23 ENCOUNTER — Ambulatory Visit: Payer: Managed Care, Other (non HMO) | Admitting: Occupational Therapy

## 2022-09-23 LAB — ANA: Anti Nuclear Antibody (ANA): POSITIVE — AB

## 2022-09-23 LAB — CBC WITH DIFFERENTIAL/PLATELET
MCV: 66.8 fL — ABNORMAL LOW (ref 80.0–100.0)
Neutro Abs: 2402 cells/uL (ref 1500–7800)
RDW: 22 % — ABNORMAL HIGH (ref 11.0–15.0)
WBC: 5.1 10*3/uL (ref 3.8–10.8)

## 2022-09-23 LAB — MYOSITIS SPECIFIC II ANTIBODIES PANEL
JO-1 AB: <11 SI (ref <11)
PL-7 AB: <11 SI (ref <11)

## 2022-09-23 LAB — COMPLETE METABOLIC PANEL WITH GFR: eGFR: 75 mL/min/{1.73_m2} (ref >=60)

## 2022-09-23 NOTE — Progress Notes (Signed)
CBC shows hemoglobin 12.2 consistent with anemia.  Patient should take multivitamin with iron.  CMP normal.  Urine protein creatinine ratio normal.  CK2 27 which is mildly elevated.  Aldolase normal.  Myositis assessment panel normal.  [facility] CR antibody pending.  ANA 1: 1280 centromere pattern consistent with scleroderma.  Complements normal.  ENA panel (Smith, RNP, double-stranded DNA, SCL 70) negative, urine protein creatinine ratio normal.

## 2022-09-25 ENCOUNTER — Telehealth: Payer: Self-pay | Admitting: Pharmacy Technician

## 2022-09-25 NOTE — Telephone Encounter (Signed)
Dr. Allena Katz, [facility] note:  Patient will be scheduled as soon as possible.  Auth Submission: NO AUTH NEEDED Site of care: Site of care: [facility] Payer: [employer] Medication & CPT/J Code(s) submitted: [employer] (IVIG) J1459 Route of submission (phone, fax, portal):  Phone # Fax # Auth type: Buy/Bill Units/visits requested: 2g/kg qd x5 days, then 1g/kg q 28days. Reference number:  Approval from: 09/25/22 to 04/27/23

## 2022-09-26 LAB — PROTEIN / CREATININE RATIO, URINE
Creatinine, Urine: 157 mg/dL (ref 20–320)
Protein/Creatinine Ratio: 0.057 mg/mg creat (ref 0.025–0.148)

## 2022-09-26 LAB — MYOSITIS SPECIFIC II ANTIBODIES PANEL
EJ AB: <11 SI (ref <11)
MDA-5 AB: <11 SI (ref <11)
MI-2 ALPHA AB: <11 SI (ref <11)
MI-2 BETA AB: <11 SI (ref <11)
NXP-2 AB: <11 SI (ref <11)
OJ AB: <11 SI (ref <11)
PL-12 AB: <11 SI (ref <11)
SRP-AB: <11 SI (ref <11)
TIF-1y AB: <11 SI (ref <11)

## 2022-09-26 LAB — COMPLETE METABOLIC PANEL WITH GFR
ALT: 19 U/L (ref 9–46)
AST: 25 U/L (ref 10–35)
Alkaline phosphatase (APISO): 44 U/L (ref 35–144)
Calcium: 9.6 mg/dL (ref 8.6–10.3)
Chloride: 104 mmol/L (ref 98–110)
Creat: 1.16 mg/dL (ref 0.70–1.30)
Globulin: 2.5 g/dL (calc) (ref 1.9–3.7)
Glucose, Bld: 93 mg/dL (ref 65–99)
Total Bilirubin: 0.6 mg/dL (ref 0.2–1.2)
Total Protein: 6.7 g/dL (ref 6.1–8.1)

## 2022-09-26 LAB — ANTI-NUCLEAR AB-TITER (ANA TITER)
ANA TITER: 1:80 {titer} — ABNORMAL HIGH
ANA Titer 1: >=1:1280 {titer} — AB

## 2022-09-26 LAB — CBC WITH DIFFERENTIAL/PLATELET
Absolute Monocytes: 653 cells/uL (ref 200–950)
Hemoglobin: 12.2 g/dL — ABNORMAL LOW (ref 13.2–17.1)
MCH: 21.4 pg — ABNORMAL LOW (ref 27.0–33.0)
Monocytes Relative: 12.8 %
Total Lymphocyte: 31.1 %

## 2022-09-26 LAB — ALDOLASE: Aldolase: 5.2 U/L (ref <=8.1)

## 2022-09-26 LAB — ANTI-SCLERODERMA ANTIBODY: Scleroderma (Scl-70) (ENA) Antibody, IgG: <1 AI

## 2022-09-26 LAB — HMGCR AB (IGG): HMGCR AB (IGG): <2 CU (ref <20)

## 2022-09-29 ENCOUNTER — Telehealth: Payer: Self-pay | Admitting: Rheumatology

## 2022-09-29 NOTE — Telephone Encounter (Signed)
Dr. Allena Katz,  I wanted you to know that all Devin Erickson's labs are available now.  We have tried reaching him multiple times and did not get any response.  We referred him to [facility] rheumatology for advice. [facility] rheumatology office also tried reaching out to him multiple times and did not get a response to schedule an appointment.  He was also referred to cardiology as scleroderma patients can get pulmonary hypertension.  He has not responded to them also.  We even tried reaching out to his wife who recommended to call him directly.  Thank You,  Pollyann Savoy MD

## 2022-09-30 ENCOUNTER — Ambulatory Visit: Payer: Managed Care, Other (non HMO) | Attending: Neurology | Admitting: Occupational Therapy

## 2022-09-30 DIAGNOSIS — R278 Other lack of coordination: Secondary | ICD-10-CM | POA: Diagnosis present

## 2022-09-30 DIAGNOSIS — R29818 Other symptoms and signs involving the nervous system: Secondary | ICD-10-CM | POA: Insufficient documentation

## 2022-09-30 DIAGNOSIS — M6281 Muscle weakness (generalized): Secondary | ICD-10-CM | POA: Diagnosis not present
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
## 2022-10-01 ENCOUNTER — Ambulatory Visit: Payer: Managed Care, Other (non HMO)

## 2022-10-01 ENCOUNTER — Telehealth: Payer: Self-pay | Admitting: Neurology

## 2022-10-01 NOTE — Telephone Encounter (Signed)
Contacted [facility] center to see if they have contacted patient and they have not.

## 2022-10-01 NOTE — Telephone Encounter (Signed)
Spoke to Dr. Allena Katz and she called patient because rheumatology has been unsuccessful in reaching patient and she noticed that he canceled his IVIG.    Called patient and he stated that he has contacted  Rheumatology and has got that squared away. Patient also stated that he canceled his IVIG because he was informed by Olegario Messier at the infusion center that the treatments are going to be 3 1/2 hours long and that is not going to work out with his work schedule. He states he has been missing a lot of work as it is with the doctor appointments he has now. Patient wanted to see if there was any other treatment that would work better for him?

## 2022-10-01 NOTE — Telephone Encounter (Signed)
Unfortunately, we do not have any other medication option which are as effective as IVIG or steroids.  With his new autoimmune condition, its best to avoid steroids.  There are oral options but this will take up to 6 months to see their benefit, so I do not use them as first line.  Would home infusion for IVIG make it any easier?

## 2022-10-01 NOTE — Telephone Encounter (Signed)
Returning a call he believes it is about the infusion but he would like a call back after 1:00 today

## 2022-10-02 ENCOUNTER — Ambulatory Visit: Payer: Managed Care, Other (non HMO)

## 2022-10-02 NOTE — Telephone Encounter (Signed)
Called patient and informed him of Dr. Eliane Decree recommendations below. Patient feels that the oral route would be best for him and he is willing to give it a try and does not mind if it takes about 6 months to see it's benefit. Informed patient I will let Dr. Allena Katz know and that we will be in touch. Patient thanked [place] for the call.

## 2022-10-05 ENCOUNTER — Ambulatory Visit: Payer: 59 | Admitting: Rheumatology

## 2022-10-05 DIAGNOSIS — E782 Mixed hyperlipidemia: Secondary | ICD-10-CM

## 2022-10-05 DIAGNOSIS — L2089 Other atopic dermatitis: Secondary | ICD-10-CM

## 2022-10-05 DIAGNOSIS — M349 Systemic sclerosis, unspecified: Secondary | ICD-10-CM

## 2022-10-05 DIAGNOSIS — Z7952 Long term (current) use of systemic steroids: Secondary | ICD-10-CM

## 2022-10-05 DIAGNOSIS — J309 Allergic rhinitis, unspecified: Secondary | ICD-10-CM

## 2022-10-05 DIAGNOSIS — L630 Alopecia (capitis) totalis: Secondary | ICD-10-CM

## 2022-10-05 DIAGNOSIS — M5030 Other cervical disc degeneration, unspecified cervical region: Secondary | ICD-10-CM

## 2022-10-05 DIAGNOSIS — G6181 Chronic inflammatory demyelinating polyneuritis: Secondary | ICD-10-CM

## 2022-10-05 DIAGNOSIS — R748 Abnormal levels of other serum enzymes: Secondary | ICD-10-CM

## 2022-10-05 DIAGNOSIS — I7300 Raynaud's syndrome without gangrene: Secondary | ICD-10-CM

## 2022-10-05 DIAGNOSIS — J45991 Cough variant asthma: Secondary | ICD-10-CM

## 2022-10-07 ENCOUNTER — Ambulatory Visit: Payer: Managed Care, Other (non HMO) | Admitting: Occupational Therapy

## 2022-10-07 DIAGNOSIS — R278 Other lack of coordination: Secondary | ICD-10-CM

## 2022-10-07 DIAGNOSIS — M6281 Muscle weakness (generalized): Secondary | ICD-10-CM

## 2022-10-07 DIAGNOSIS — R29818 Other symptoms and signs involving the nervous system: Secondary | ICD-10-CM

## 2022-10-07 NOTE — Telephone Encounter (Signed)
Called patient and informed him that Dr. Allena Katz would like to discuss him about the medication, side effects, monitoring, etc. He will need to schedule an appointment with Dr. Allena Katz and virtual will be ok. Patient stated that was fine with him and is aware I will have someone give him a call to get him scheduled. Patient is at PT right now so we will give him a call tomorrow. Patient verbalized understanding and had no further questions or concerns.

## 2022-10-07 NOTE — Telephone Encounter (Signed)
Please let pt know that I would like to discuss with him about the medication, side effects, monitoring, etc.  He will need to schedule follow-up visit (virtual is ok).
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
## 2022-10-13 ENCOUNTER — Ambulatory Visit (HOSPITAL_COMMUNITY)
Admission: RE | Admit: 2022-10-13 | Discharge: 2022-10-13 | Disposition: A | Discharge status: Home / Self Care | Payer: Managed Care, Other (non HMO) | Source: Ambulatory Visit | Attending: Rheumatology | Admitting: Rheumatology

## 2022-10-13 DIAGNOSIS — M349 Systemic sclerosis, unspecified: Secondary | ICD-10-CM | POA: Diagnosis present

## 2022-10-14 ENCOUNTER — Ambulatory Visit: Payer: Managed Care, Other (non HMO) | Admitting: Occupational Therapy

## 2022-10-14 ENCOUNTER — Telehealth (INDEPENDENT_AMBULATORY_CARE_PROVIDER_SITE_OTHER): Payer: Managed Care, Other (non HMO) | Admitting: Neurology

## 2022-10-14 DIAGNOSIS — G6181 Chronic inflammatory demyelinating polyneuritis: Secondary | ICD-10-CM | POA: Diagnosis not present

## 2022-10-14 MED ORDER — MYCOPHENOLATE MOFETIL 500 MG PO TABS: 500.0000 mg | ORAL_TABLET | Freq: Two times a day (BID) | ORAL | 5 refills | Status: DC

## 2022-10-14 NOTE — Progress Notes (Unsigned)
   Virtual Visit via Video Note The purpose of this virtual visit is to provide medical care while limiting exposure to the novel coronavirus.    Consent was obtained for video visit:  {yes no:[id number]} Answered questions that patient had about telehealth interaction:  {yes no:[id number]} I discussed the limitations, risks, security and privacy concerns of performing an evaluation and management service by telemedicine. I also discussed with the patient that there may be a patient responsible charge related to this service. The patient expressed understanding and agreed to proceed.  Pt location: Home Physician Location: office Name of referring provider:  Jac Canavan, PA-C I connected with Devin Erickson at patients initiation/request on 10/14/2022 at  8:50 AM EDT by video enabled telemedicine application and verified that I am speaking with the correct person using two identifiers. Pt MRN:  [id number] Pt DOB:  1971-04-03 Video Participants:  Devin Erickson;  ***   History of Present Illness: This is a 52 y.o. male returning for follow-up of ***.     Observations/Objective:   There were no vitals filed for this visit. Patient is awake, alert, and appears comfortable.  Oriented x 4.   Extraocular muscles are intact. No ptosis.  Face is symmetric.  Speech is not dysarthric. Tongue is midline. Antigravity in all extremities.  No pronator drift. Gait appears normal ***.   Assessment and Plan:    - Due to time constraints and limited time off work, he is unable to continue IVIG  - Start Cellcept 500mg    - Check CBC and CMP weekly x 4 weeks, then monthly x 3, then every 3 months  Follow Up Instructions:   I discussed the assessment and treatment plan with the patient. The patient was provided an opportunity to ask questions and all were answered. The patient agreed with the plan and demonstrated an understanding of the instructions.   The patient was advised to call back or  seek an in-person evaluation if the symptoms worsen or if the condition fails to improve as anticipated.  Follow-up in ***  Total time spent:  *** minutes     Glendale Chard, DO

## 2022-10-19 ENCOUNTER — Ambulatory Visit: Payer: 59 | Admitting: Medical

## 2022-10-19 NOTE — Progress Notes (Signed)
HRCT is negative for ILD.  Coronary atherosclerosis noted.  Dilated thoracic aorta noted.  Radiologist recommends annual scanning.  Please forward results to patient's PCP and cardiologist.

## 2022-10-21 ENCOUNTER — Encounter: Payer: Managed Care, Other (non HMO) | Admitting: Occupational Therapy

## 2022-10-21 ENCOUNTER — Ambulatory Visit: Payer: Managed Care, Other (non HMO) | Admitting: Occupational Therapy

## 2022-10-21 ENCOUNTER — Telehealth: Payer: Self-pay | Admitting: Neurology

## 2022-10-21 NOTE — Telephone Encounter (Signed)
[facility] called and they are patients specialty pharmacy that needs to be used. Provided verbal orders for medication Cellcept 500mg  twice daily, dispense 60 tablets with 5 refills. Provided ICD code G 61.81.   Called patient and informed him that his medication Cellcept has been taken cared of with his specialty pharmacy.
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
## 2022-10-21 NOTE — Telephone Encounter (Signed)
Pt left a VM about needing to speak to someone about his get refill   He did not leave the name of the medication

## 2022-10-21 NOTE — Progress Notes (Signed)
Results sent to MyChart.  Copy to cardiology given the three-vessel CAD finding.  FYI  CT chest shows some cholesterol buildup in the coronaries as well as the aorta.  There is also some enlargement of the aorta.  Continue cholesterol medicine.  Continue low-cholesterol diet.  I recommend a follow-up with cardiology to discuss the findings as well.  If you want to come in here and discussed her status fine.  Otherwise lets do a check in with heart doctor as you have seen them before

## 2022-10-30 ENCOUNTER — Encounter: Payer: Self-pay | Admitting: Nurse Practitioner

## 2022-10-30 ENCOUNTER — Ambulatory Visit: Payer: Managed Care, Other (non HMO) | Attending: Nurse Practitioner | Admitting: Nurse Practitioner

## 2022-10-30 VITALS — BP 126/78 | Temp 78.0°F | Ht 75.0 in | Wt 236.4 lb

## 2022-10-30 DIAGNOSIS — I77810 Thoracic aortic ectasia: Secondary | ICD-10-CM

## 2022-10-30 DIAGNOSIS — G6181 Chronic inflammatory demyelinating polyneuritis: Secondary | ICD-10-CM

## 2022-10-30 DIAGNOSIS — I2510 Atherosclerotic heart disease of native coronary artery without angina pectoris: Secondary | ICD-10-CM | POA: Diagnosis not present

## 2022-10-30 DIAGNOSIS — I700 Atherosclerosis of aorta: Secondary | ICD-10-CM | POA: Diagnosis not present

## 2022-10-30 DIAGNOSIS — E785 Hyperlipidemia, unspecified: Secondary | ICD-10-CM

## 2022-10-30 DIAGNOSIS — G4733 Obstructive sleep apnea (adult) (pediatric): Secondary | ICD-10-CM

## 2022-10-30 DIAGNOSIS — R9431 Abnormal electrocardiogram [ECG] [EKG]: Secondary | ICD-10-CM

## 2022-10-30 DIAGNOSIS — E669 Obesity, unspecified: Secondary | ICD-10-CM

## 2022-10-30 MED ORDER — METOPROLOL TARTRATE 100 MG PO TABS: ORAL_TABLET | ORAL | 0 refills | Status: DC

## 2022-10-30 NOTE — Patient Instructions (Signed)
Medication Instructions:  Metoprolol Tartrate 100 mg take 1 tab 2 hours prior to CT.  *If you need a refill on your cardiac medications before your next appointment, please call your pharmacy*   Lab Work: Fasting Lipid & LFTs prior to procedure   Testing/Procedures: Your physician has requested that you have an echocardiogram. Echocardiography is a painless test that uses sound waves to create images of your heart. It provides your doctor with information about the size and shape of your heart and how well your heart's chambers and valves are working. This procedure takes approximately one hour. There are no restrictions for this procedure. Please do NOT wear cologne, perfume, aftershave, or lotions (deodorant is allowed). Please arrive 15 minutes prior to your appointment time.     Your cardiac CT will be scheduled at one of the below locations:   [facility] [address] [place], [place] [address] [phone]  OR  [facility] [address] [place], [place] [address] [phone]  OR   [facility] [address] [place], [place] [address] [phone]  If scheduled at [facility], please arrive at the [facility] Entrance (Entrance C2) of [facility] 30 minutes prior to test start time. You can use the FREE valet parking offered at entrance C (encouraged to control the heart rate for the test)  Proceed to the [facility] Radiology Department (first floor) to check-in and test prep.  All radiology patients and guests should use entrance C2 at [facility], accessed from [facility], even though the hospital's physical address listed is [address]    If scheduled at [facility] or [facility], please arrive 15 mins early for check-in and test prep.   Please follow these  instructions carefully (unless otherwise directed):  An IV will be required for this test and Nitroglycerin will be given.  Hold all erectile dysfunction medications at least 3 days (72 hrs) prior to test. (Ie viagra, cialis, sildenafil, tadalafil, etc)   On the Night Before the Test: Be sure to Drink plenty of water. Do not consume any caffeinated/decaffeinated beverages or chocolate 12 hours prior to your test. Do not take any antihistamines 12 hours prior to your test. If the patient has contrast allergy: Patient will need a prescription for Prednisone and very clear instructions (as follows): Prednisone 50 mg - take 13 hours prior to test Take another Prednisone 50 mg 7 hours prior to test Take another Prednisone 50 mg 1 hour prior to test Take Benadryl 50 mg 1 hour prior to test Patient must complete all four doses of above prophylactic medications. Patient will need a ride after test due to Benadryl.  On the Day of the Test: Drink plenty of water until 1 hour prior to the test. Do not eat any food 1 hour prior to test. You may take your regular medications prior to the test.  Take metoprolol (Lopressor) two hours prior to test. If you take Furosemide/Hydrochlorothiazide/Spironolactone, please HOLD on the morning of the test. FEMALES- please wear underwire-free bra if available, avoid dresses & tight clothing   *For Clinical Staff only. Please instruct patient the following:* Heart Rate Medication Recommendations for Cardiac CT  Resting HR < 50 bpm  No medication  Resting HR 50-60 bpm and BP >110/50 mmHG   Consider Metoprolol tartrate 25 mg PO 90-120 min prior to scan  Resting HR  60-65 bpm and BP >110/50 mmHG  Metoprolol tartrate 50 mg PO 90-120 minutes prior to scan   Resting HR > 65 bpm and BP >110/50 mmHG  Metoprolol tartrate 100 mg PO 90-120 minutes prior to scan  Consider Ivabradine 10-15 mg PO or a calcium channel blocker for resting HR >60 bpm and contraindication to  metoprolol tartrate  Consider Ivabradine 10-15 mg PO in combination with metoprolol tartrate for HR >80 bpm         After the Test: Drink plenty of water. After receiving IV contrast, you may experience a mild flushed feeling. This is normal. On occasion, you may experience a mild rash up to 24 hours after the test. This is not dangerous. If this occurs, you can take Benadryl 25 mg and increase your fluid intake. If you experience trouble breathing, this can be serious. If it is severe call 911 IMMEDIATELY. If it is mild, please call our office. If you take any of these medications: Glipizide/Metformin, Avandament, Glucavance, please do not take 48 hours after completing test unless otherwise instructed.  We will call to schedule your test 2-4 weeks out understanding that some insurance companies will need an authorization prior to the service being performed.   For more information and frequently asked questions, please visit our website : [email or web address]  For non-scheduling related questions, please contact the cardiac imaging nurse navigator should you have any questions/concerns: Rockwell Alexandria, Cardiac Imaging Nurse Navigator Larey Brick, Cardiac Imaging Nurse Navigator [facility] Direct Office Dial: [phone]   For scheduling needs, including cancellations and rescheduling, please call [place], [phone].    Follow-Up: At [facility], you and your health needs are our priority.  As part of our continuing mission to provide you with exceptional heart care, we have created designated Provider Care Teams.  These Care Teams include your primary Cardiologist (physician) and Advanced Practice Providers (APPs -  Physician Assistants and Nurse Practitioners) who all work together to provide you with the care you need, when you need it.  We recommend signing up for the patient portal called "MyChart".  Sign up information is  provided on this After Visit Summary.  MyChart is used to connect with patients for Virtual Visits (Telemedicine).  Patients are able to view lab/test results, encounter notes, upcoming appointments, etc.  Non-urgent messages can be sent to your provider as well.   To learn more about what you can do with MyChart, go to [email or web address].    Your next appointment:   2 month(s)  Provider:   Bernadene Person, NP

## 2022-10-30 NOTE — Progress Notes (Signed)
Office Visit    Patient Name: Devin Erickson Date of Encounter: 10/30/2022  Primary Care Provider:  Jac Canavan, PA-C Primary Cardiologist:  Peter Swaziland, MD  Chief Complaint    52 year old male with a history of abnormal EKG, coronary artery calcification noted on CT, mild dilation of ascending aorta, hyperlipidemia, chronic inflammatory demyelinating polyradiculoneuropathy, cervical spondylosis, scleroderma, Raynaud's phenomenon, anxiety, OSA and obesity who presents for follow-up related to coronary artery calcification.  Past Medical History    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   History of sleep apnea 04/2012   per overnight screen/oximetry    Obesity    Thalassemia minor    Wears glasses    Wears glasses    Past Surgical History:  Procedure Laterality Date   COLONOSCOPY  04/27/2010   [facility], normal per pt   TONSILLECTOMY     TONSILLECTOMY AND ADENOIDECTOMY     age 39yo    Allergies  No Known Allergies   Labs/Other Studies Reviewed    The following studies were reviewed today: None    Recent Labs: 02/18/2022: TSH 1.65 09/11/2022: ALT 19; BUN 20; Creat 1.16; Hemoglobin 12.2; Platelets 228; Potassium 4.3; Sodium 140  Recent Lipid Panel    Component Value Date/Time   CHOL 202 (H) 02/05/2021 1552   TRIG 88 02/05/2021 1552   HDL 52 02/05/2021 1552   CHOLHDL 3.9 02/05/2021 1552   CHOLHDL 3.7 07/22/2016 1443   VLDL 19 07/22/2016 1443   LDLCALC 134 (H) 02/05/2021 1552    History of Present Illness    52 year old male with the above past medical history including abnormal EKG, coronary artery calcification noted on CT, mild dilation of ascending aorta, for lipidemia, chronic inflammatory demyelinating polyradiculoneuropathy, cervical spondylosis, scleroderma, Raynaud's  phenomenon, anxiety, OSA, and obesity.  He was remotely seen by Dr. Anne Fu and apparently had a normal stress echo in 2012.  He was re-referred to cardiology per his PCP in the setting of abnormal EKG.  He was last seen in the office on 03/31/2021 by Dr. Swaziland stable from a cardiac standpoint.  He denies symptoms concerning for angina.  He was active, exercising 4 days a week. EKG was felt to be stable overall.  Follow-up was recommended as needed.  Since that time he has followed with rheumatology  for management of limited systemic sclerosis. Recent high-resolution CT chest per Rheumatology showed no evidence of ILD, however it did show three-vessel coronary atherosclerosis, aortic atherosclerosis, dilated ascending thoracic aorta, measuring 42 mm. Follow-up with cardiology was recommended.   He presents today for follow-up.  Since his last visit he has done well from a cardiac standpoint.  He denies any symptoms concerning for angina.  He remains active, exercises regularly.  Overall, he reports feeling well.  Home Medications    Current Outpatient Medications  Medication Sig Dispense Refill   albuterol (VENTOLIN HFA) 108 (90 Base) MCG/ACT inhaler Inhale 2 puffs into the lungs every 6 (six) hours as needed for wheezing or shortness of breath. 8 g 0   cetirizine (ZYRTEC) 10 MG tablet Take 10 mg by mouth daily.     gabapentin (NEURONTIN) 300 MG capsule Take 1 capsule (300 mg total) by mouth 2 (two) times daily. 60 capsule 5   metoprolol  tartrate (LOPRESSOR) 100 MG tablet take 1 tab 2 hours prior to CT. 1 tablet 0   Multiple Vitamin (MULTIVITAMIN) tablet Take 1 tablet by mouth daily.     mycophenolate (CELLCEPT) 500 MG tablet Take 1 tablet (500 mg total) by mouth 2 (two) times daily. 60 tablet 5   rosuvastatin (CRESTOR) 20 MG tablet TAKE 1 TABLET BY MOUTH EVERYDAY AT BEDTIME 90 tablet 3   No current facility-administered medications for this visit.     Review of Systems    He denies chest  pain, palpitations, dyspnea, pnd, orthopnea, n, v, dizziness, syncope, edema, weight gain, or early satiety. All other systems reviewed and are otherwise negative except as noted above. -I==  Physical Exam    VS:  BP 126/78   Temp (!) 78 F (25.6 C)   Ht 6\' 3"  (1.905 m)   Wt 236 lb 6.4 oz (107.2 kg)   SpO2 95%   BMI 29.55 kg/m  GEN: Well nourished, well developed, in no acute distress. HEENT: normal. Neck: Supple, no JVD, carotid bruits, or masses. Cardiac: RRR, no murmurs, rubs, or gallops. No clubbing, cyanosis, edema.  Radials/DP/PT 2+ and equal bilaterally.  Respiratory:  Respirations regular and unlabored, clear to auscultation bilaterally. GI: Soft, nontender, nondistended, BS + x 4. MS: no deformity or atrophy. Skin: warm and dry, no rash. Neuro:  Strength and sensation are intact. Psych: Normal affect.  Accessory Clinical Findings    ECG personally reviewed by me today - EKG Interpretation Date/Time:  Friday October 30 2022 08:56:13 EDT Ventricular Rate:  85 PR Interval:  160 QRS Duration:  96 QT Interval:  396 QTC Calculation: 471 R Axis:   75  Text Interpretation: Normal sinus rhythm Normal ECG When compared with ECG of 05-Dec-2010 13:05, No significant change was found Confirmed by Bernadene Person ([id number]) on 10/30/2022 9:06:15 AM  - no acute changes.   Lab Results  Component Value Date   WBC 5.1 09/11/2022   HGB 12.2 (L) 09/11/2022   HCT 38.1 (L) 09/11/2022   MCV 66.8 (L) 09/11/2022   PLT 228 09/11/2022   Lab Results  Component Value Date   CREATININE 1.16 09/11/2022   BUN 20 09/11/2022   NA 140 09/11/2022   K 4.3 09/11/2022   CL 104 09/11/2022   CO2 28 09/11/2022   Lab Results  Component Value Date   ALT 19 09/11/2022   AST 25 09/11/2022   ALKPHOS 49 02/05/2021   BILITOT 0.6 09/11/2022   Lab Results  Component Value Date   CHOL 202 (H) 02/05/2021   HDL 52 02/05/2021   LDLCALC 134 (H) 02/05/2021   TRIG 88 02/05/2021   CHOLHDL 3.9 02/05/2021     Lab Results  Component Value Date   HGBA1C 5.4 07/29/2017    Assessment & Plan    1. History of abnormal EKG/coronary artery calcification noted on CT/aortic atherosclerosis: EKG previously felt to be stable overall.  No acute changes on Today's EKG. recent high-resolution CT chest per rheumatology showed three-vessel coronary atherosclerosis, aortic atherosclerosis, dilated ascending thoracic aorta measuring 42 mm.  Stable with no anginal symptoms.  However, given evidence of three-vessel coronary atherosclerosis on CT, through shared decision making, and for further risk stratification will check Coronary CTA, echo.  Will update CMET prior to CT.  He will take 100 mg of metoprolol prior to scan.  Reviewed ED precautions.  Continue Crestor.  2. Dilation of ascending aorta: Dilation of the ascending thoracic aorta noted on recent CT chest,  measuring 42 mm.  Will plan for follow-up CT in 1 year.   3. Hyperlipidemia: No recent LDL on file.  Will update fasting lipid panel.  If CT reveals significant calcium/CAD and LDL is above goal, consider escalation of statin therapy.  Continue Crestor.  4. Limited systemic sclerosis: Following with rheumatology. Will check echo as above.   5. Chronic inflammatory demyelinating polyradiculoneuropathy: Follows with neurology  6. OSA: No longer on CPAP in the setting of significant weight loss.  7. Obesity: Encouraged ongoing lifestyle modifications with diet and exercise.   8. Disposition: Follow-up in 2 months.       Joylene Grapes, NP 10/30/2022, 10:09 AM

## 2022-11-04 ENCOUNTER — Other Ambulatory Visit: Payer: Managed Care, Other (non HMO)

## 2022-11-13 ENCOUNTER — Telehealth (HOSPITAL_COMMUNITY): Payer: Self-pay | Admitting: *Deleted

## 2022-11-13 NOTE — Telephone Encounter (Signed)
Reaching out to patient to offer assistance regarding upcoming cardiac imaging study; pt verbalizes understanding of appt date/time, parking situation and where to check in, pre-test NPO status and medications ordered, and verified current allergies; name and call back number provided for further questions should they arise Hayley Sharpe RN Navigator Cardiac Imaging [facility] [phone] office [phone] cell  

## 2022-11-16 ENCOUNTER — Ambulatory Visit (HOSPITAL_COMMUNITY): Payer: Managed Care, Other (non HMO)

## 2022-11-19 ENCOUNTER — Ambulatory Visit (HOSPITAL_COMMUNITY)
Admission: RE | Admit: 2022-11-19 | Discharge: 2022-11-19 | Disposition: A | Discharge status: Home / Self Care | Payer: Managed Care, Other (non HMO) | Source: Ambulatory Visit | Attending: Nurse Practitioner | Admitting: Nurse Practitioner

## 2022-11-19 DIAGNOSIS — R9431 Abnormal electrocardiogram [ECG] [EKG]: Secondary | ICD-10-CM | POA: Diagnosis present

## 2022-11-19 DIAGNOSIS — I700 Atherosclerosis of aorta: Secondary | ICD-10-CM | POA: Diagnosis present

## 2022-11-19 DIAGNOSIS — I2510 Atherosclerotic heart disease of native coronary artery without angina pectoris: Secondary | ICD-10-CM

## 2022-11-19 MED ORDER — NITROGLYCERIN 0.4 MG SL SUBL
SUBLINGUAL_TABLET | SUBLINGUAL | Status: AC
  Filled 2022-11-19: qty 2

## 2022-11-19 MED ORDER — IOHEXOL 350 MG/ML SOLN
95.0000 mL | Freq: Once | INTRAVENOUS | Status: AC | PRN
  Administered 2022-11-19: 95 mL via INTRAVENOUS

## 2022-11-19 MED ORDER — NITROGLYCERIN 0.4 MG SL SUBL
0.8000 mg | SUBLINGUAL_TABLET | Freq: Once | SUBLINGUAL | Status: AC
  Administered 2022-11-19: 0.8 mg via SUBLINGUAL

## 2022-11-24 ENCOUNTER — Ambulatory Visit (HOSPITAL_COMMUNITY): Payer: Managed Care, Other (non HMO) | Attending: Nurse Practitioner

## 2022-11-24 DIAGNOSIS — I2510 Atherosclerotic heart disease of native coronary artery without angina pectoris: Secondary | ICD-10-CM | POA: Diagnosis present

## 2022-11-24 LAB — ECHOCARDIOGRAM COMPLETE
Area-P 1/2: 3.48 cm2
P 1/2 time: 547 msec
S' Lateral: 3 cm

## 2022-11-25 NOTE — Progress Notes (Deleted)
Office Visit Note  Patient: Devin Erickson             Date of Birth: October 30, 1970           MRN: [id number]             PCP: Jac Canavan, PA-C Referring: Jac Canavan, PA-C Visit Date: 12/09/2022 Occupation: @GUAROCC @  Subjective:  No chief complaint on file.   History of Present Illness: Devin Erickson is a 52 y.o. male ***with scleroderma, Sjogren's and CIDP.  Patient was evaluated by Dr. Sherryll Burger [facility] rheumatology) on November 03, 2022.  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  I reviewed his note from November 03, 2022.  He agreed that based on his scleroderma manifestations he does not need immunosuppressive therapy.  Although for CIDP continuing CellCept was reasonable.  He recommended annual PFTs and echocardiograms.  He also mentioned possible use of Jak and a betters for alopecia totalis.    Activities of Daily Living:  Patient reports morning stiffness for *** {minute/hour:19697}.   Patient {ACTIONS;DENIES/REPORTS:21021675::"Denies"} nocturnal pain.  Difficulty dressing/grooming: {ACTIONS;DENIES/REPORTS:21021675::"Denies"} Difficulty climbing stairs: {ACTIONS;DENIES/REPORTS:21021675::"Denies"} Difficulty getting out of chair: {ACTIONS;DENIES/REPORTS:21021675::"Denies"} Difficulty using hands for taps, buttons, cutlery, and/or writing: {ACTIONS;DENIES/REPORTS:21021675::"Denies"}  No Rheumatology ROS completed.   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   Neuropathy, arm, right 01/20/2022   Radiculopathy, cervical region 10/15/2021   Left arm pain 06/18/2021   Upper back pain 06/18/2021   Decreased ROM of neck 06/18/2021   Screening for heart disease 02/05/2021   Screening for prostate cancer 02/05/2021   Sleep disturbance 02/05/2021   Chronic right-sided thoracic back pain 05/08/2020   Fatigue 07/10/2019   Raynaud's phenomenon  without gangrene 07/10/2019   Erectile dysfunction 07/10/2019   At high risk for bleeding 07/10/2019   ANA positive 07/10/2019   Elevated liver function tests 03/08/2019   Need for influenza vaccination 02/16/2019   Vaccine counseling 02/16/2019   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   Chronic left shoulder pain 05/02/2018   Benign paroxysmal positional vertigo due to bilateral vestibular disorder 06/24/2017   Encounter for health maintenance examination in adult 07/22/2016   Chronic pain of right knee 07/22/2016   Atopic dermatitis 12/12/2015   Tension headache, chronic 10/04/2015   Cough variant asthma vs UACS 09/04/2015   Epistaxis 08/01/2014   Rhinitis, allergic 08/01/2014   Hyperlipidemia 08/01/2014   OSA (obstructive sleep apnea) 08/01/2014    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   History of sleep apnea 04/2012   per overnight screen/oximetry    Obesity    Thalassemia minor    Wears glasses    Wears glasses     Family History  Problem Relation Age of Onset   Cancer Mother 71       colon   Hypertension Mother    Dementia Father    Cancer Father        pancreas   Healthy Brother    Healthy Son    Heart disease Neg Hx    Lung disease Neg Hx    Past Surgical History:  Procedure Laterality Date   COLONOSCOPY  04/27/2010   [facility], normal per  pt   TONSILLECTOMY     TONSILLECTOMY AND ADENOIDECTOMY     age 19yo   Social History   Social History Narrative   Married, son, Devin Erickson - Designer, television/film set, Set designer, exercise - going gym 4 days per week.  Jehoviah Witness.  01/2021         Right Handed    Lives in a one story home    Immunization History  Administered Date(s) Administered   Influenza Split 02/07/2009, 03/04/2010   Influenza,inj,Quad PF,6+ Mos 02/15/2012, 03/10/2013,  06/14/2014, 02/01/2015, 02/08/2017, 03/10/2018, 02/16/2019, 04/05/2020, 01/06/2021, 01/29/2022   Influenza-Unspecified 05/15/2016   Moderna Sars-Covid-2 Vaccination 07/13/2019, 08/10/2019   PFIZER Comirnaty(Gray Top)Covid-19 Tri-Sucrose Vaccine 08/23/2020   PFIZER(Purple Top)SARS-COV-2 Vaccination 03/02/2020   Pfizer Covid-19 Vaccine Bivalent Booster 64yrs & up 01/06/2021   Tdap 02/15/2012, 08/10/2012   Unspecified SARS-COV-2 Vaccination 01/29/2022   Zoster Recombinant(Shingrix) 06/08/2020, 08/07/2020     Objective: Vital Signs: There were no vitals taken for this visit.   Physical Exam   Musculoskeletal Exam: ***  CDAI Exam: CDAI Score: -- Patient Global: --; Provider Global: -- Swollen: --; Tender: -- Joint Exam 12/09/2022   No joint exam has been documented for this visit   There is currently no information documented on the homunculus. Go to the Rheumatology activity and complete the homunculus joint exam.  Investigation: No additional findings.  Imaging: ECHOCARDIOGRAM COMPLETE  Result Date: 11/24/2022    ECHOCARDIOGRAM REPORT   Patient Name:   Devin Erickson Date of Exam: 11/24/2022 Medical Rec #:  [id number]          Height:       75.0 in Accession #:    [id number]         Weight:       236.4 lb Date of Birth:  1970/08/01          BSA:          2.356 m Patient Age:    52 years           BP:           126/78 mmHg Patient Gender: M                  HR:           76 bpm. Exam Location:  [address] Procedure: 2D Echo, Cardiac Doppler, Color Doppler and Strain Analysis Indications:    I25.10 CAD  History:        Patient has no prior history of Echocardiogram examinations.                 CAD, Abnormal ECG, Dilated ascending aorta; Risk                 Factors:Dyslipidemia.  Sonographer:    Samule Ohm RDCS Referring Phys: [phone] EMILY C MONGE IMPRESSIONS  1. Left ventricular ejection fraction, by estimation, is 60 to 65%. The left ventricle has normal function. The left  ventricle has no regional wall motion abnormalities. Left ventricular diastolic parameters were normal. The average left ventricular global longitudinal strain is -21.2 %. The global longitudinal strain is normal.  2. Right ventricular systolic function is normal. The right ventricular size is normal. There is normal pulmonary artery systolic pressure. The estimated right ventricular systolic pressure is 25.5 mmHg.  3. The mitral valve is grossly normal. Trivial mitral valve regurgitation. No evidence of mitral stenosis.  4. The aortic valve is tricuspid. Aortic valve regurgitation is not visualized. No aortic stenosis is present.  5. The inferior vena cava is normal in size with greater than 50% respiratory variability, suggesting right atrial pressure of 3 mmHg. FINDINGS  Left Ventricle: Left ventricular ejection fraction, by estimation, is 60 to 65%. The left ventricle has normal function. The left ventricle has no regional wall motion abnormalities. The average left ventricular global longitudinal strain is -21.2 %. The global longitudinal strain is normal. The left ventricular internal cavity size was normal in size. There is no left ventricular hypertrophy. Left ventricular diastolic parameters were normal. Right Ventricle: The right ventricular size is normal. No increase in right ventricular wall thickness. Right ventricular systolic function is normal. There is normal pulmonary artery systolic pressure. The tricuspid regurgitant velocity is 2.37 m/s, and  with an assumed right atrial pressure of 3 mmHg, the estimated right ventricular systolic pressure is 25.5 mmHg. Left Atrium: Left atrial size was normal in size. Right Atrium: Right atrial size was normal in size. Pericardium: There is no evidence of pericardial effusion. Mitral Valve: The mitral valve is grossly normal. Trivial mitral valve regurgitation. No evidence of mitral valve stenosis. Tricuspid Valve: The tricuspid valve is grossly normal.  Tricuspid valve regurgitation is mild . No evidence of tricuspid stenosis. Aortic Valve: The aortic valve is tricuspid. Aortic valve regurgitation is not visualized. Aortic regurgitation PHT measures 547 msec. No aortic stenosis is present. Pulmonic Valve: The pulmonic valve was grossly normal. Pulmonic valve regurgitation is trivial. No evidence of pulmonic stenosis. Aorta: The aortic root and ascending aorta are structurally normal, with no evidence of dilitation. Venous: The inferior vena cava is normal in size with greater than 50% respiratory variability, suggesting right atrial pressure of 3 mmHg. IAS/Shunts: There is redundancy of the interatrial septum. The atrial septum is grossly normal.  LEFT VENTRICLE PLAX 2D LVIDd:         4.80 cm   Diastology LVIDs:         3.00 cm   LV e' medial:    8.81 cm/s LV PW:         1.06 cm   LV E/e' medial:  7.6 LV IVS:        0.92 cm   LV e' lateral:   10.00 cm/s LVOT diam:     2.30 cm   LV E/e' lateral: 6.7 LV SV:         108 LV SV Index:   46        2D Longitudinal Strain LVOT Area:     4.15 cm  2D Strain GLS Avg:     -21.2 %  RIGHT VENTRICLE             IVC RV S prime:     19.10 cm/s  IVC diam: 1.50 cm TAPSE (M-mode): 2.6 cm RVSP:           25.5 mmHg LEFT ATRIUM              Index        RIGHT ATRIUM           Index LA diam:        4.20 cm  1.78 cm/m   RA Pressure: 3.00 mmHg LA Vol (A2C):   100.0 ml 42.44 ml/m  RA Area:     16.90 cm LA Vol (A4C):   69.8 ml  29.63 ml/m  RA Volume:   46.60 ml  19.78 ml/m LA Biplane Vol: 91.3 ml  38.75 ml/m  AORTIC VALVE LVOT Vmax:   131.00 cm/s LVOT Vmean:  81.600 cm/s LVOT VTI:    0.261 m AI PHT:      547 msec  AORTA Ao Root diam: 4.00 cm Ao Asc diam:  3.80 cm MITRAL VALVE               TRICUSPID VALVE MV Area (PHT): 3.48 cm    TR Peak grad:   22.5 mmHg MV Decel Time: 218 msec    TR Vmax:        237.00 cm/s MV E velocity: 67.10 cm/s  Estimated RAP:  3.00 mmHg MV A velocity: 83.80 cm/s  RVSP:           25.5 mmHg MV E/A ratio:  0.80                             SHUNTS                            Systemic VTI:  0.26 m                            Systemic Diam: 2.30 cm Lennie Odor MD Electronically signed by Lennie Odor MD Signature Date/Time: 11/24/2022/8:26:13 PM    Final    CT CORONARY MORPH W/CTA COR W/SCORE Vallarie Mare W/CM &/OR WO/CM  Result Date: 11/23/2022 HISTORY: ECG abnormal, high CAD risk CAD screening, normal prior > 2 yrs abnormal ekg, coronary calcification on ct EXAM: Cardiac/Coronary CT TECHNIQUE: The patient was scanned on a [employer]. PROTOCOL: A 120 kV retrospective scan was triggered in the descending thoracic aorta at 111 HU's. Axial non-contrast 3 mm slices were carried out through the heart. The data set was analyzed on a dedicated work station and scored using the Agatston method. Gantry rotation speed was 250 msecs and collimation was 0.6 mm. Heart rate was optimized medically and sl NTG was given. The 3D data set was reconstructed in intervals of the R-R cycle. Systolic and diastolic phases were analyzed on a dedicated work station using MPR, MIP and VRT modes. The patient received 95mL OMNIPAQUE IOHEXOL 350 MG/ML SOLN of contrast. FINDINGS: Coronary calcium score: The patient's coronary artery calcium score is 665, which places the patient in the 98th percentile. Coronary arteries: Normal coronary origins.  Right dominance. Right Coronary Artery: Normal caliber vessel, gives rise to PDA. Scattered mixed calcified and noncalcified plaque in proximal and mid vessel with maximum with 25-49% stenosis in proximal segment. Focal step artifact in ostial/proximal vessel as well as mid vessel. Ostial vessel can be seen in some of the phases, does not appear to have severe stenosis. Left Main Coronary Artery: Normal caliber vessel. No significant plaque or stenosis. Ramus intermedius: Minimal mixed calcified and noncalcified plaque at ostium with 1-24% stenosis. Left Anterior Descending Coronary Artery: Normal caliber  vessel. Scattered mixed calcified and noncalcified plaque in proximal and mid vessel with maximum with 1-24% stenosis. Focal step artifact in mid vessel. Gives rise to medium first, small second, small third, small fourth diagonal branches. Left Circumflex Artery: Normal caliber vessel. Scattered mixed calcified and noncalcified plaque in proximal and mid vessel with maximum with 1-24% stenosis. Focal step artifact in mid vessel. Gives rise to three medium OM branches. Aorta: Mildly dilated size, 42.6 mm at the mid ascending aorta (level of the PA bifurcation) measured double oblique. No aortic atherosclerosis. No dissection seen in visualized portions of the aorta.  Aortic Valve: No calcifications. Trileaflet. Other findings: Normal pulmonary vein drainage into the left atrium. Normal left atrial appendage without a thrombus. Normal size of the pulmonary artery. Normal appearance of the pericardium. Significant step artifact.  Plaque volume cannot be interpreted. IMPRESSION: 1. Diffuse mild nonobstructive CAD, CADRADS = 2. There is focal step artifact as noted above, which limits interpretation in those specific areas. 2. Coronary calcium score of 665. This was 98th percentile for age-, sex-, and race- matched controls. 3. Normal coronary origin with right dominance. 4. Mildly dilated ascending aorta size, 42.6 mm INTERPRETATION: CAD-RADS 2: Mild non-obstructive CAD (25-49%). Consider non-atherosclerotic causes of chest pain. Consider preventive therapy and risk Erickson modification. Electronically Signed   By: Jodelle Red M.D.   On: 11/23/2022 06:51    Recent Labs: Lab Results  Component Value Date   WBC 5.1 09/11/2022   HGB 12.2 (L) 09/11/2022   PLT 228 09/11/2022   NA 140 09/11/2022   K 4.3 09/11/2022   CL 104 09/11/2022   CO2 28 09/11/2022   GLUCOSE 93 09/11/2022   BUN 20 09/11/2022   CREATININE 1.16 09/11/2022   BILITOT 0.6 09/11/2022   ALKPHOS 49 02/05/2021   AST 25 09/11/2022   ALT  19 09/11/2022   PROT 6.7 09/11/2022   ALBUMIN 4.2 05/26/2022   CALCIUM 9.6 09/11/2022   GFRAA 84 02/16/2019   November 03, 2022 WBC 6.1, hemoglobin 12.2, platelets 221, CMP creatinine 1.2, AST 46, ALT 27, antimitochondrial antibody negative, CK592, dsDNA negative, antichromatin negative, antiribosomal P-, SSA negative, SSB negative, anticentromere 8.0, Smith negative, RNP negative, SCL 70 negative, Jo 1 negative, TSH normal  IMPRESSION: 1. No evidence of interstitial lung disease. No active pulmonary disease. 2. Three-vessel coronary atherosclerosis. 3. Dilated 4.2 cm ascending thoracic aorta. Recommend annual imaging followup by CTA or MRA. This recommendation follows 2010 ACCF/AHA/AATS/ACR/ASA/SCA/SCAI/SIR/STS/SVM Guidelines for the Diagnosis and Management of Patients with Thoracic Aortic Disease. Circulation. 2010; 121: M010-U725. Aortic aneurysm NOS (ICD10-I71.9). 4.  Aortic Atherosclerosis (ICD10-I70.0).     Electronically Signed   By: Delbert Phenix M.D.   On: 10/18/2022 18:42    Speciality Comments: No specialty comments available.  Procedures:  No procedures performed Allergies: Patient has no known allergies.   Assessment / Plan:     Visit Diagnoses: No diagnosis found.  Orders: No orders of the defined types were placed in this encounter.  No orders of the defined types were placed in this encounter.   Face-to-face time spent with patient was *** minutes. Greater than 50% of time was spent in counseling and coordination of care.  Follow-Up Instructions: No follow-ups on file.   Pollyann Savoy, MD  Note - This record has been created using Animal nutritionist.  Chart creation errors have been sought, but may not always  have been located. Such creation errors do not reflect on  the standard of medical care.

## 2022-12-07 ENCOUNTER — Telehealth: Payer: Self-pay | Admitting: Neurology

## 2022-12-07 NOTE — Telephone Encounter (Signed)
Patient is having cold symptoms and is wanting to know what medication is okay to take with the new medication he has been prescribed

## 2022-12-07 NOTE — Telephone Encounter (Signed)
Called patient and informed him of Dr. Eliane Decree recommendation. Patient verbalized understanding and thanked [place] for the call.

## 2022-12-07 NOTE — Telephone Encounter (Signed)
He may take any over the counter cold remedy, there is no interaction with cellcept.

## 2022-12-08 ENCOUNTER — Ambulatory Visit: Payer: 59 | Admitting: Medical

## 2022-12-08 ENCOUNTER — Other Ambulatory Visit: Payer: Managed Care, Other (non HMO)

## 2022-12-08 VITALS — BP 122/82 | HR 76 | Temp 97.4°F | Resp 16 | Wt 266.0 lb

## 2022-12-08 DIAGNOSIS — J3489 Other specified disorders of nose and nasal sinuses: Secondary | ICD-10-CM

## 2022-12-08 DIAGNOSIS — R5381 Other malaise: Secondary | ICD-10-CM | POA: Diagnosis not present

## 2022-12-08 DIAGNOSIS — Z79899 Other long term (current) drug therapy: Secondary | ICD-10-CM | POA: Diagnosis not present

## 2022-12-08 DIAGNOSIS — R052 Subacute cough: Secondary | ICD-10-CM | POA: Diagnosis not present

## 2022-12-08 MED ORDER — ALBUTEROL SULFATE HFA 108 (90 BASE) MCG/ACT IN AERS: 2.0000 | INHALATION_SPRAY | Freq: Four times a day (QID) | RESPIRATORY_TRACT | 1 refills | Status: DC | PRN

## 2022-12-08 MED ORDER — ROSUVASTATIN CALCIUM 20 MG PO TABS: ORAL_TABLET | ORAL | 0 refills | Status: DC

## 2022-12-08 MED ORDER — CLARITHROMYCIN 500 MG PO TABS: 500.0000 mg | ORAL_TABLET | Freq: Two times a day (BID) | ORAL | 0 refills | Status: DC

## 2022-12-08 NOTE — Progress Notes (Signed)
Subjective:  Devin Erickson is a 52 y.o. male who presents for Chief Complaint  Patient presents with   sick    Sick for almost 2 weeks. Runny nose, fatigue, coughing at night, wheezing, SOB, some fever and chills, covid negative 2 days     Here for 2 week hx/o illness.  Has had runy nose, fatigue, coughi, some wheezing or SOB, some chills.  Wheezing worse at night.  Last fever was 2 nights ago, fever around 101. Chills mainly at night.  Some nausa, no vomiting.   Taste sense decreased despite 3 different negative covid tests.  Initaly had some sore throat.   No neck pain, maybe some stiffness at times.  +head pressure.  No ear pain.  Using OTC theraflu.  Some mucinex, some dayquil.  No sick contacts.  No rash, no urinary change, no abdominal pain.  No other aggravating or relieving factors.    No other c/o.  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     gabapentin (NEURONTIN) 300 MG capsule Take 1 capsule (300 mg total) by mouth 2 (two) times daily. 60 capsule 5   Multiple Vitamin (MULTIVITAMIN) tablet Take 1 tablet by mouth daily.     mycophenolate (CELLCEPT) 500 MG tablet Take 1 tablet (500 mg total) by mouth 2 (two) times daily. 60 tablet 5   No current facility-administered medications on file prior to visit.     The following portions of the patient's history were reviewed and updated as appropriate: allergies, current medications, past family history, past medical history, past social history, past surgical history and problem  list.  ROS Otherwise as in subjective above   Objective: BP 122/82   Pulse 76   Temp (!) 97.4 F (36.3 C)   Resp 16   Wt 266 lb (120.7 kg)   SpO2 96%   BMI 33.25 kg/m   General appearance: alert, no distress, well developed, well nourished HEENT: normocephalic, sclerae anicteric, conjunctiva pink and moist, TMs flat, but no eryteham, nares patent, mucoid discharge + erythema, pharynx normal Oral cavity: MMM, no lesions Neck: supple, no lymphadenopathy, no thyromegaly, no masses Heart: RRR, normal S1, S2, no murmurs Lungs: CTA bilaterally, no wheezes, rhonchi, or rales Abdomen: +bs, soft, non tender, non distended, no masses, no hepatomegaly, no splenomegaly Pulses: 2+ radial pulses, 2+ pedal pulses, normal cap refill Ext: no edema    Assessment: Encounter Diagnoses  Name Primary?   Subacute cough Yes   Sinus pressure    High risk medication use    Malaise      Plan: We discussed symptoms and concerns.  Begin Biaxin medication.  Hold the cholesterol medicine for the next week while on the antibiotic.  Can continue over-the-counter remedies for cough and congestion, need to increase water intake.  Rest.  Refilled inhaler for as needed use and make sure you are using this 2-3 times a day for the next  week.  If not much improved within the next 3 to 4 days then call back or recheck.  I did review some recent blood work he had done last month  Ayvan Stockhausen" was seen today for sick.  Diagnoses and all orders for this visit:  Subacute cough  Sinus pressure  High risk medication use  Malaise  Other orders -     clarithromycin (BIAXIN) 500 MG tablet; Take 1 tablet (500 mg total) by mouth 2 (two) times daily. -     Discontinue: rosuvastatin (CRESTOR) 20 MG tablet; TAKE 1 TABLET BY MOUTH EVERYDAY AT BEDTIME -     albuterol (VENTOLIN HFA) 108 (90 Base) MCG/ACT inhaler; Inhale 2 puffs into the lungs every 6 (six) hours as needed for wheezing or shortness of breath. -      Discontinue: rosuvastatin (CRESTOR) 20 MG tablet; TAKE 1 TABLET BY MOUTH EVERYDAY AT BEDTIME -     rosuvastatin (CRESTOR) 20 MG tablet; TAKE 1 TABLET BY MOUTH EVERYDAY AT BEDTIME    Follow up: prn

## 2022-12-09 ENCOUNTER — Ambulatory Visit: Payer: Self-pay | Admitting: Rheumatology

## 2022-12-09 DIAGNOSIS — M349 Systemic sclerosis, unspecified: Secondary | ICD-10-CM

## 2022-12-09 DIAGNOSIS — I7300 Raynaud's syndrome without gangrene: Secondary | ICD-10-CM

## 2022-12-09 DIAGNOSIS — G6181 Chronic inflammatory demyelinating polyneuritis: Secondary | ICD-10-CM

## 2022-12-09 DIAGNOSIS — L630 Alopecia (capitis) totalis: Secondary | ICD-10-CM

## 2022-12-09 DIAGNOSIS — L2089 Other atopic dermatitis: Secondary | ICD-10-CM

## 2022-12-09 DIAGNOSIS — M5030 Other cervical disc degeneration, unspecified cervical region: Secondary | ICD-10-CM

## 2022-12-09 DIAGNOSIS — Z79899 Other long term (current) drug therapy: Secondary | ICD-10-CM

## 2022-12-09 DIAGNOSIS — R748 Abnormal levels of other serum enzymes: Secondary | ICD-10-CM

## 2022-12-09 DIAGNOSIS — J309 Allergic rhinitis, unspecified: Secondary | ICD-10-CM

## 2022-12-09 DIAGNOSIS — I77819 Aortic ectasia, unspecified site: Secondary | ICD-10-CM

## 2022-12-09 DIAGNOSIS — I2510 Atherosclerotic heart disease of native coronary artery without angina pectoris: Secondary | ICD-10-CM

## 2022-12-09 DIAGNOSIS — G4733 Obstructive sleep apnea (adult) (pediatric): Secondary | ICD-10-CM

## 2022-12-09 DIAGNOSIS — J45991 Cough variant asthma: Secondary | ICD-10-CM

## 2022-12-09 DIAGNOSIS — R7989 Other specified abnormal findings of blood chemistry: Secondary | ICD-10-CM

## 2022-12-09 DIAGNOSIS — M3500 Sicca syndrome, unspecified: Secondary | ICD-10-CM

## 2022-12-09 DIAGNOSIS — E782 Mixed hyperlipidemia: Secondary | ICD-10-CM

## 2022-12-16 ENCOUNTER — Ambulatory Visit (INDEPENDENT_AMBULATORY_CARE_PROVIDER_SITE_OTHER): Payer: 59 | Admitting: Medical

## 2022-12-16 VITALS — BP 124/74 | HR 78 | Temp 97.3°F | Wt 265.6 lb

## 2022-12-16 DIAGNOSIS — R062 Wheezing: Secondary | ICD-10-CM | POA: Diagnosis not present

## 2022-12-16 DIAGNOSIS — Z79899 Other long term (current) drug therapy: Secondary | ICD-10-CM

## 2022-12-16 DIAGNOSIS — J988 Other specified respiratory disorders: Secondary | ICD-10-CM

## 2022-12-16 DIAGNOSIS — R0989 Other specified symptoms and signs involving the circulatory and respiratory systems: Secondary | ICD-10-CM | POA: Diagnosis not present

## 2022-12-16 MED ORDER — AIRSUPRA 90-80 MCG/ACT IN AERO: 2.0000 | INHALATION_SPRAY | RESPIRATORY_TRACT | Status: DC | PRN

## 2022-12-16 MED ORDER — PREDNISONE 10 MG PO TABS: ORAL_TABLET | ORAL | 0 refills | Status: DC

## 2022-12-16 NOTE — Progress Notes (Signed)
Subjective:  Devin Erickson is a 52 y.o. male who presents for Chief Complaint  Patient presents with   Nasal Congestion    Wheezing in chest, stuffy nose at night, symptoms are worse at night     Here for recheck.  I saw him on 12/08/22 for illness.  At that time he had 2 week hx/o runny nose, fatigue, cough, wheezing, sob, chills, fever.  At that time we used biaxin and mucinex.  He still has a few days of biaxin left.    Still has stuffy nose, nasal drainage, some wheezing at night.  Symptoms overall worse at night.  Using albuterol couple times a day.  He did switch to Sudafed recently and found that to be more helpful.  He wonders if the CellCept he is on is reducing his ability to fight off infection.  CellCept is new.  No additional fever, body aches, chills.  Overall mainly congestion and wheezing  No other aggravating or relieving factors.    No other c/o.  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   History of sleep apnea 04/2012   per overnight screen/oximetry    Obesity    Thalassemia minor    Wears glasses    Wears glasses     Current Outpatient Medications on File Prior to Visit  Medication Sig Dispense Refill   albuterol (VENTOLIN HFA) 108 (90 Base) MCG/ACT inhaler Inhale 2 puffs into the lungs every 6 (six) hours as needed for wheezing or shortness of breath. 8 g 1   cetirizine (ZYRTEC) 10 MG tablet Take 10 mg by mouth daily.     clarithromycin (BIAXIN) 500 MG tablet Take 1 tablet (500 mg total) by mouth 2 (two) times daily. 20 tablet 0   gabapentin (NEURONTIN) 300 MG capsule Take 1 capsule (300 mg total) by mouth 2 (two) times daily. 60 capsule 5   Multiple Vitamin (MULTIVITAMIN) tablet Take 1 tablet by mouth daily.     mycophenolate (CELLCEPT) 500 MG tablet Take 1 tablet (500 mg total) by mouth 2  (two) times daily. 60 tablet 5   rosuvastatin (CRESTOR) 20 MG tablet TAKE 1 TABLET BY MOUTH EVERYDAY AT BEDTIME 90 tablet 0   No current facility-administered medications on file prior to visit.    The following portions of the patient's history were reviewed and updated as appropriate: allergies, current medications, past family history, past medical history, past social history, past surgical history and problem list.  ROS Otherwise as in subjective above   Objective: BP 124/74   Pulse 78   Temp (!) 97.3 F (36.3 C)   Wt 265 lb 9.6 oz (120.5 kg)   SpO2 97%   BMI 33.20 kg/m   General appearance: alert, no distress, well developed, well nourished HEENT: normocephalic, sclerae anicteric, conjunctiva pink and moist, TMs flat, but no erythema, nares with turbinate edema, mild erythema, some dried clotted blood in the right nostril, pharynx normal Oral cavity: MMM, no lesions Neck: supple, no lymphadenopathy, no thyromegaly, no masses Heart: RRR, normal S1, S2, no murmurs Lungs: Decreased breath sounds but no wheezes, rhonchi, or rales Pulses: 2+ radial pulses, 2+ pedal pulses, normal cap refill Ext: no edema    Assessment: Encounter Diagnoses  Name Primary?   Respiratory tract infection  Yes   Wheezing    Runny nose    High risk medication use       Plan: No obvious serious worsening of symptoms.  Advise he finish out the next few days of Biaxin he has left.  Continue Sudafed for several more days if that is helping.  Can use his natural nasal saline nasal spray and can also use Flonase to next few days over-the-counter.  Stop albuterol temporarily and try a sample of Airsupra which has inhaled steroid and albuterol.  Do this at bedtime and 1-2 of the times a day for the next few days to help with symptoms.  Continue good water intake.  If not much improved within the next 2 to 3 days and let me know  [name] was seen today for nasal congestion.  Diagnoses and all  orders for this visit:  Respiratory tract infection  Wheezing  Runny nose  High risk medication use  Other orders -     Albuterol-Budesonide (AIRSUPRA) 90-80 MCG/ACT AERO; Inhale 2 puffs into the lungs every 4 (four) hours as needed. -     predniSONE (DELTASONE) 10 MG tablet; 6 tablets all together day 1, 5 tablets day 2, 4 tablets day 3, 3 tablets day 4, 2 tablets day 5, 1 tablet day 6.    Follow up: prn

## 2022-12-20 ENCOUNTER — Ambulatory Visit
Admission: RE | Admit: 2022-12-20 | Discharge: 2022-12-20 | Disposition: A | Discharge status: Home / Self Care | Payer: Managed Care, Other (non HMO) | Source: Ambulatory Visit | Attending: Neurology | Admitting: Neurology

## 2022-12-20 DIAGNOSIS — G6181 Chronic inflammatory demyelinating polyneuritis: Secondary | ICD-10-CM

## 2022-12-20 DIAGNOSIS — R937 Abnormal findings on diagnostic imaging of other parts of musculoskeletal system: Secondary | ICD-10-CM

## 2022-12-20 DIAGNOSIS — M5412 Radiculopathy, cervical region: Secondary | ICD-10-CM

## 2022-12-20 MED ORDER — GADOPICLENOL 0.5 MMOL/ML IV SOLN
10.0000 mL | Freq: Once | INTRAVENOUS | Status: AC | PRN
  Administered 2022-12-20: 10 mL via INTRAVENOUS

## 2022-12-24 ENCOUNTER — Encounter: Payer: Self-pay | Admitting: Podiatry

## 2022-12-24 ENCOUNTER — Ambulatory Visit (INDEPENDENT_AMBULATORY_CARE_PROVIDER_SITE_OTHER): Payer: 59 | Admitting: Podiatry

## 2022-12-24 DIAGNOSIS — M7751 Other enthesopathy of right foot: Secondary | ICD-10-CM

## 2022-12-24 MED ORDER — TRIAMCINOLONE ACETONIDE 10 MG/ML IJ SUSP
10.0000 mg | Freq: Once | INTRAMUSCULAR | Status: AC
Start: 2022-12-24 — End: 2022-12-24
  Administered 2022-12-24: 10 mg via INTRA_ARTICULAR

## 2022-12-25 NOTE — Progress Notes (Signed)
Subjective:   Patient ID: Devin Erickson, male   DOB: 52 y.o.   MRN: [id number]   HPI Patient presents stating that he is having a lot of pain in the bottom of his right foot and its inflamed with fluid buildup   ROS      Objective:  Physical Exam  Neurovascular status intact with patient found to have inflammation subfifth metatarsal base with a deep seeded lesion present within it     Assessment:  Inflammatory capsulitis fluid buildup that is formed base of fifth MPJ with a lucent porokeratotic lesion     Plan:  Revealed and reviewed chronic nature of condition.  Sterile prep injected the plantar capsule fifth MPJ base 3 mg dexamethasone Kenalog 5 mg Xylocaine to reduce the inflammation debrided the lesion and applied padding.  Reappoint to recheck

## 2022-12-29 ENCOUNTER — Telehealth: Payer: Self-pay | Admitting: Medical

## 2022-12-29 ENCOUNTER — Other Ambulatory Visit (INDEPENDENT_AMBULATORY_CARE_PROVIDER_SITE_OTHER): Payer: Managed Care, Other (non HMO)

## 2022-12-29 ENCOUNTER — Ambulatory Visit (INDEPENDENT_AMBULATORY_CARE_PROVIDER_SITE_OTHER): Payer: Managed Care, Other (non HMO) | Admitting: Neurology

## 2022-12-29 ENCOUNTER — Other Ambulatory Visit: Payer: Self-pay | Admitting: Medical

## 2022-12-29 ENCOUNTER — Encounter: Payer: Self-pay | Admitting: Neurology

## 2022-12-29 VITALS — BP 122/80 | HR 93 | Resp 20 | Ht 75.0 in | Wt 269.0 lb

## 2022-12-29 DIAGNOSIS — G6181 Chronic inflammatory demyelinating polyneuritis: Secondary | ICD-10-CM | POA: Diagnosis not present

## 2022-12-29 MED ORDER — AIRSUPRA 90-80 MCG/ACT IN AERO: 2.0000 | INHALATION_SPRAY | RESPIRATORY_TRACT | 1 refills | Status: DC | PRN

## 2022-12-29 MED ORDER — ALBUTEROL SULFATE HFA 108 (90 BASE) MCG/ACT IN AERS: 2.0000 | INHALATION_SPRAY | Freq: Four times a day (QID) | RESPIRATORY_TRACT | 0 refills | Status: AC | PRN

## 2022-12-29 NOTE — Progress Notes (Signed)
Follow-up Visit   Date: 12/29/2022    Devin Erickson MRN: [id number] DOB: 09/14/1970    Devin Erickson is a 52 y.o. right-handed African American male with hyperlipidemia and anxiety returning to the clinic for follow-up of polyradiculoneuropathy.  The patient was accompanied to the clinic by self.   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 He was unable to continue IVIG and opted to start Cellcept. - Continue Cellcept 500mg  BID  - Check CBC and CMP today  - Continue gabapentin 300mg  BID  2.  Cervical spondylosis with multilevel foraminal stenosis, mild-moderate at C5-5 and C6-7 which would not cause the severity of his hand weakness.  However, imaging did mention T1 hypointense lesion at T1 vertebral body ?hemangioma as well as numerous small cysts within the parotid glands.   - Awaiting results from MRI cervical spine wwo contrast   3.  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    Return to clinic in 3 months   --------------------------------------------- History of present illness: Starting around April 2023, he began noticing mild weakness in the arm especially when weight lifting, it was more effortful on the right side.  Over the summer, he noticed weakness in grip on the right hand which continued to progress to the point where he is unable to use his hand to crank his ignition, turning keys to entire his home, grasping objects, and using utensils.  He is dropping things frequently.  He has difficulty straightening the hand.  He denies weakness with raising the arm or bending at the elbow.  He has mild pain in the right shoulder, described as a nagging pain.  He treated it with  tylenol.  Occasionally, he has numbness over the tips of the fingers.     He denies similar symptoms in the left hand.  No numbness/tingling in the feet.  He denies any ongoing pain or neck pain.  MRI cervical spine was performed in July which shows cervical spondylosis and multilevel foraminal stenosis at right C3-C4, bilateral C4-5 and left C6-7. Additionally, there was note of edema signal in the C6 and C7 spinous process as well as interspinous ligaments at C5-6, C6-7, C7-T1, and T1-T2.     NCS/EMG performed in September shows diffuse prolonged latency, slowed motor conduction velocity, and reduced amplitude involving the median and ulnar nerves.  Needle EMG shows active fibrillation potentials in the FDI, ABP, EDC, and biceps muscles.  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 Labs indicate positive ANA.   UPDATE 03/25/2022:  He is here to discuss results of EMG which favors a polyradiculoneuropathy affecting both arms, worse on the right.  He reports having discomfort in the left shoulder, similar to what he experienced in the beginning on the right.  No numbness/tinging of the left arm.  His right hand remains unchanged and continues to have weakness.  He has difficulty with fine motor tasks.   UPDATE 06/01/2022:  He is here for follow-up visit and discuss LP results.  There has been no significant change with his hand weakness or tingling, which remains worse in the right hand.  No similar symptoms in the legs or imbalance.  LP shows  elevated protein of 116, normal white count and presence of oligoclonal bands.  IgG is normal.   UPDATE 09/15/2022:  He is here for follow-up visit.  Since his last visit, he continued to have additional monthly solumedrol and reports no significant change.  He is having mild tingling in the left hand and has noticed some  weakness.   He was evaluated by Dr. 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    UPDATE 12/29/2022:  He is here for follow-up.  He has not noticed any new weakness of the hand and feels that symptoms stabilized with slight improvement.  He is tolerating Cellcept 500mg  BID.  No new weakness, numbness/tingling.  He will be seeing rheumatology next week.  He has noticed that it takes longer for him to get over a viral illness now.  He has a URI several weeks ago and only now starting to feel better.   Medications:  Current Outpatient Medications on File Prior to Visit  Medication Sig Dispense Refill   albuterol (VENTOLIN HFA) 108 (90 Base) MCG/ACT inhaler Inhale 2 puffs into the lungs every 6 (six) hours as needed for wheezing or shortness of breath. 8 g 1   clarithromycin (BIAXIN) 500 MG tablet Take 1 tablet (500 mg total) by mouth 2 (two) times daily. 20 tablet 0   gabapentin (NEURONTIN) 300 MG capsule Take 1 capsule (300 mg total) by mouth 2 (two) times daily. 60 capsule 5   Multiple Vitamin (MULTIVITAMIN) tablet Take 1 tablet by mouth daily.     mycophenolate (CELLCEPT) 500 MG tablet Take 1 tablet (500 mg total) by mouth 2 (two) times daily. 60 tablet 5   rosuvastatin (CRESTOR) 20 MG tablet TAKE 1 TABLET BY MOUTH EVERYDAY AT BEDTIME 90 tablet 0   cetirizine (ZYRTEC) 10 MG tablet Take 10 mg by mouth daily. (Patient not taking: Reported on 12/29/2022)     No current facility-administered medications on file prior to visit.    Allergies: No Known Allergies  Vital Signs:  BP 122/80   Pulse 93   Resp 20   Ht 6\' 3"  (1.905 m)   Wt 269 lb (122 kg)   SpO2 97%   BMI 33.62 kg/m   Neurological Exam: MENTAL STATUS including orientation to time, place, person, recent and remote memory, attention span and concentration, language, and fund of knowledge is normal.  Speech is not dysarthric.  CRANIAL NERVES:   Normal conjugate, extra-ocular  eye movements in all directions of gaze.  No ptosis.  Face is symmetric. Palate elevates symmetrically.  Tongue is midline.  MOTOR:  Right FDI, ADM, ABP, and medial forearm atrophy.  No fasciculations or abnormal movements.  No pronator drift.    Upper Extremity:  Right   Left  Deltoid  5/5    5/5   Biceps  5/5    5/5   Triceps  5/5    5/5   Wrist extensors  5/5    5/5   Wrist flexors  5-/5    5/5   Finger extensors  3/5    5/5   Finger flexors  4/5    5/5   Dorsal interossei  3/5    5-/5   Abductor pollicis  3/5    5/5   Tone (Ashworth scale)  0   0    Lower Extremity:  Right   Left  Hip flexors  5/5    5/5   Knee flexors  5/5  5/5   Dorsiflexors  5/5    5/5   Plantarflexors  5/5    5/5   Toe extensors  5/5    5/5   Toe flexors  5/5    5/5   Tone (Ashworth scale)  0   0    MSRs:                                              Right        Left brachioradialis tr   1+  biceps tr   1+  triceps tr   1+  patellar 1+   1+  ankle jerk 1+   1+  Hoffman no   no  plantar response down   down    SENSORY:  Reduced temperature in the right palm, compared to the left.  Sensation intact in the legs.     COORDINATION/GAIT: Normal finger-to- nose-finger.  Finger tapping slowed on the right due to weakness.   Gait narrow based and stable.   Data: Labs 02/18/2022:  vitamin B12 880, folate > 23, CRP <1.0, ESR 16, TSH 1.65, ANCA neg, ACE 33, copper 110, SPEP with IFE no M protein, cryo neg, vitamin B1 7*, ANA positive, centromere antibody 1:640*  NCS/EMG of bilateral arms 03/24/2022 performed at [facility]: This is a complex study.  Findings are most suggestive of a subacute demyelinating and axonal polyradiculoneuropathy affecting the upper extremities, which is worse on the right.    NCS/EMG of the right arm 01/08/2022 performed at [facility]: EMG & NCV Findings:  Impression: The above electrodiagnostic study is ABNORMAL but is difficult to fully interpret.  The needle EMG  shows significant denervation but active motor unit potentials in multiple muscles which could be consistent with cervical stenosis but the MRI does not show any cervical stenosis.  This could be a brachial plexus type lesion although it does not seem to fit to a specific trauma although it is closest to an upper trunk brachial plexus lesion.  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    However the patient seems to have a concomitant median neuropathy at the wrist but also slowing of the ulnar nerve distal to the elbow and this would represent more of a demyelination type lesion on top of what was seen with the denervation potentials.     MRI cervical spine wo contrast 11/03/21: Intermittently motion degraded exam.   Indeterminate 18 mm T2 STIR hyperintense and T1 hypointense lesion within the T1 vertebral body. While this may reflect an atypical hemangioma, alternative etiologies (including osseous metastatic disease) cannot be excluded. Short-interval 2-3 month MRI follow-up without and with contrast is recommended to monitor this finding.   Marrow edema within the C6 and C7 spinous processes. Edema signal also present within the interspinous ligaments at C5-C6, C6-C7, C7-T1 and T1-T2. Findings are nonspecific, but possibly degenerative or posttraumatic in etiology. If there has been recent trauma, the interspinous ligament edema could reflect interspinous ligament injury and a cervical spine CT should be considered to exclude C6/C7 spinous process fractures. Additionally, attention to these sites recommended at MRI imaging follow-up.   Cervical spondylosis, as outlined. No more than mild relative spinal canal narrowing. Multilevel foraminal stenosis, as detailed and greatest on the right at C3-C4 (moderate), bilaterally at C4-C5 (mild-to-moderate) and on the left at C6-C7 (mild-to-moderate).   Nonspecific straightening of the  expected cervical lordosis.  CSF 05/26/2022:  R0 W1 P116*  G56   ACE 5,  IgG index 0.54, MBP pending, OCB present*, cytology negative  Total time spent reviewing records, interview, history/exam, documentation, and coordination of care on day of encounter:  30 min     Thank you for allowing me to participate in patient's care.  If I can answer any additional questions, I would be pleased to do so.    Sincerely,    Yuniel Blaney K. Allena Katz, DO /

## 2022-12-29 NOTE — Telephone Encounter (Signed)
Pt called re Devin Erickson  does seem to be working, needs rx

## 2022-12-29 NOTE — Telephone Encounter (Signed)
done

## 2022-12-29 NOTE — Patient Instructions (Signed)
Check CBC and CMP  I will see you back in 3 months

## 2022-12-30 LAB — COMPREHENSIVE METABOLIC PANEL
ALT: 21 U/L (ref 0–53)
AST: 28 U/L (ref 0–37)
Albumin: 4.1 g/dL (ref 3.5–5.2)
Alkaline Phosphatase: 45 U/L (ref 39–117)
BUN: 16 mg/dL (ref 6–23)
CO2: 27 meq/L (ref 19–32)
Calcium: 9.3 mg/dL (ref 8.4–10.5)
Chloride: 104 meq/L (ref 96–112)
Creatinine, Ser: 1.21 mg/dL (ref 0.40–1.50)
GFR: 68.05 mL/min (ref >60.00)
Glucose, Bld: 93 mg/dL (ref 70–99)
Potassium: 3.8 meq/L (ref 3.5–5.1)
Sodium: 137 meq/L (ref 135–145)
Total Bilirubin: 0.7 mg/dL (ref 0.2–1.2)
Total Protein: 6.8 g/dL (ref 6.0–8.3)

## 2022-12-30 LAB — CBC
HCT: 34.5 % — ABNORMAL LOW (ref 39.0–52.0)
Hemoglobin: 11.4 g/dL — ABNORMAL LOW (ref 13.0–17.0)
MCHC: 32.9 g/dL (ref 30.0–36.0)
MCV: 68.7 fl — ABNORMAL LOW (ref 78.0–100.0)
Platelets: 229 10*3/uL (ref 150.0–400.0)
RBC: 5.03 Mil/uL (ref 4.22–5.81)
RDW: 18.5 % — ABNORMAL HIGH (ref 11.5–15.5)
WBC: 7.8 10*3/uL (ref 4.0–10.5)

## 2023-01-04 NOTE — Progress Notes (Deleted)
Office Visit Note  Patient: Devin Erickson             Date of Birth: 07/23/1970           MRN: [id number]             PCP: Devin Canavan, PA-C Referring: Devin Canavan, PA-C Visit Date: 01/11/2023 Occupation: @GUAROCC @  Subjective:  No chief complaint on file.   History of Present Illness: Devin Erickson is a 52 y.o. male ***   Patient was evaluated by Dr.Shah at [facility] on November 03, 2022.  After thorough evaluation Dr. 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  He did not advise any additional treatment based on patient's clinical findings.  He also diagnosed him Sjogren's based on the MRI findings of parotid gland changes and sicca symptoms.  No additional recommendations were made.  Activities of Daily Living:  Patient reports morning stiffness for *** {minute/hour:19697}.   Patient {ACTIONS;DENIES/REPORTS:21021675::"Denies"} nocturnal pain.  Difficulty dressing/grooming: {ACTIONS;DENIES/REPORTS:21021675::"Denies"} Difficulty climbing stairs: {ACTIONS;DENIES/REPORTS:21021675::"Denies"} Difficulty getting out of chair: {ACTIONS;DENIES/REPORTS:21021675::"Denies"} Difficulty using hands for taps, buttons, cutlery, and/or writing: {ACTIONS;DENIES/REPORTS:21021675::"Denies"}  No Rheumatology ROS completed.   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   Neuropathy, arm, right 01/20/2022   Radiculopathy, cervical region 10/15/2021   Left arm pain 06/18/2021   Upper back pain 06/18/2021   Decreased ROM of neck 06/18/2021   Screening for heart disease 02/05/2021   Screening for prostate cancer 02/05/2021   Sleep disturbance 02/05/2021   Chronic right-sided thoracic back pain 05/08/2020   Fatigue 07/10/2019   Raynaud's phenomenon without gangrene 07/10/2019   Erectile dysfunction 07/10/2019   At high risk for bleeding 07/10/2019    ANA positive 07/10/2019   Elevated liver function tests 03/08/2019   Need for influenza vaccination 02/16/2019   Vaccine counseling 02/16/2019   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   Chronic left shoulder pain 05/02/2018   Benign paroxysmal positional vertigo due to bilateral vestibular disorder 06/24/2017   Encounter for health maintenance examination in adult 07/22/2016   Chronic pain of right knee 07/22/2016   Atopic dermatitis 12/12/2015   Tension headache, chronic 10/04/2015   Cough variant asthma vs UACS 09/04/2015   Epistaxis 08/01/2014   Rhinitis, allergic 08/01/2014   Hyperlipidemia 08/01/2014   OSA (obstructive sleep apnea) 08/01/2014    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   History of sleep apnea 04/2012   per overnight screen/oximetry    Obesity    Thalassemia minor    Wears glasses    Wears glasses     Family History  Problem Relation Age of Onset   Cancer Mother 68       colon   Hypertension Mother    Dementia Father    Cancer Father        pancreas   Healthy Brother    Healthy Son    Heart disease Neg Hx    Lung disease Neg Hx    Past Surgical History:  Procedure Laterality Date   COLONOSCOPY  04/27/2010   [facility] physicians, normal per pt   TONSILLECTOMY     TONSILLECTOMY AND ADENOIDECTOMY     age 62yo  Social History   Social History Narrative   Married, son, Devin Erickson - Designer, television/film set, Set designer, exercise - going gym 4 days per week.  Devin Erickson.  01/2021         Right Handed    Lives in a one story home    Occasionally caffeine   Immunization History  Administered Date(s) Administered   Influenza Split 02/07/2009, 03/04/2010, 02/15/2012   Influenza,inj,Quad PF,6+ Mos 02/15/2012, 03/10/2013, 06/14/2014, 02/01/2015, 02/08/2017, 03/10/2018, 02/16/2019, 04/05/2020,  01/06/2021, 01/29/2022   Influenza-Unspecified 05/15/2016   Moderna Sars-Covid-2 Vaccination 07/13/2019, 08/10/2019   PFIZER Comirnaty(Gray Top)Covid-19 Tri-Sucrose Vaccine 08/23/2020   PFIZER(Purple Top)SARS-COV-2 Vaccination 03/02/2020   Pfizer Covid-19 Vaccine Bivalent Booster 28yrs & up 01/06/2021   Tdap 02/15/2012, 08/10/2012   Unspecified SARS-COV-2 Vaccination 01/29/2022   Zoster Recombinant(Shingrix) 06/08/2020, 08/07/2020     Objective: Vital Signs: There were no vitals taken for this visit.   Physical Exam   Musculoskeletal Exam: ***  CDAI Exam: CDAI Score: -- Patient Global: --; Provider Global: -- Swollen: --; Tender: -- Joint Exam 01/11/2023   No joint exam has been documented for this visit   There is currently no information documented on the homunculus. Go to the Rheumatology activity and complete the homunculus joint exam.  Investigation: No additional findings.  Imaging: No results found.  Recent Labs: Lab Results  Component Value Date   WBC 7.8 12/29/2022   HGB 11.4 (L) 12/29/2022   PLT 229.0 12/29/2022   NA 137 12/29/2022   K 3.8 12/29/2022   CL 104 12/29/2022   CO2 27 12/29/2022   GLUCOSE 93 12/29/2022   BUN 16 12/29/2022   CREATININE 1.21 12/29/2022   BILITOT 0.7 12/29/2022   ALKPHOS 45 12/29/2022   AST 28 12/29/2022   ALT 21 12/29/2022   PROT 6.8 12/29/2022   ALBUMIN 4.1 12/29/2022   CALCIUM 9.3 12/29/2022   GFRAA 84 02/16/2019   Sep 11, 2022 CK227, aldolase 5.2, HMG CR antibody negative, myositis panel negative, urine protein creatinine ratio normal, C3-C4 normal, ANA > 1: 1280 centromere, nuclear, 1: 80 cytoplasmic, SCL 70 negative, RNP negative, Smith negative Labs done at [facility] :November 03, 2022 [id number] TSH normal, ANA 1: 2560 centromere, antimitochondrial antibody negative, ENA panel (antichromatin, antiribosomal P, SSA, SSB, Smith, RNP, SCL 70, Jo 1) negative, anticentromere positive  IMPRESSION: 1. No evidence of  interstitial lung disease. No active pulmonary disease. 2. Three-vessel coronary atherosclerosis. 3. Dilated 4.2 cm ascending thoracic aorta. Recommend annual imaging followup by CTA or MRA. This recommendation follows 2010 ACCF/AHA/AATS/ACR/ASA/SCA/SCAI/SIR/STS/SVM Guidelines for the Diagnosis and Management of Patients with Thoracic Aortic Disease. Circulation. 2010; 121: X914-N829. Aortic aneurysm NOS (ICD10-I71.9). 4.  Aortic Atherosclerosis (ICD10-I70.0).  Electronically Signed   By: Delbert Phenix M.D.   On: 10/18/2022 18:42  IMPRESSION: Ascending aortic aneurysm, 4.2 cm. Recommend annual imaging followup by CTA or MRA. This recommendation follows 2010 ACCF/AHA/AATS/ACR/ASA/SCA/SCAI/SIR/STS/SVM Guidelines for the Diagnosis and Management of Patients with Thoracic Aortic Disease. Circulation. 2010; 121: F621-H086. Aortic aneurysm NOS (ICD10-I71.9)     Electronically Signed   By: Narda Rutherford M.D.   On: 11/28/2022 23:26   Speciality Comments: No specialty comments available.  Procedures:  No procedures performed Allergies: Patient has no known allergies.   Assessment / Plan:     Visit Diagnoses: No diagnosis found.  Orders: No orders of the defined types were placed in this encounter.  No orders of the defined types were placed in this encounter.   Face-to-face time spent with patient was ***  minutes. Greater than 50% of time was spent in counseling and coordination of care.  Follow-Up Instructions: No follow-ups on file.   Pollyann Savoy, MD  Note - This record has been created using Animal nutritionist.  Chart creation errors have been sought, but may not always  have been located. Such creation errors do not reflect on  the standard of medical care.

## 2023-01-06 ENCOUNTER — Ambulatory Visit: Payer: Managed Care, Other (non HMO) | Admitting: Nurse Practitioner

## 2023-01-11 ENCOUNTER — Ambulatory Visit: Payer: Self-pay | Admitting: Rheumatology

## 2023-01-11 DIAGNOSIS — R748 Abnormal levels of other serum enzymes: Secondary | ICD-10-CM

## 2023-01-11 DIAGNOSIS — M349 Systemic sclerosis, unspecified: Secondary | ICD-10-CM

## 2023-01-11 DIAGNOSIS — J45991 Cough variant asthma: Secondary | ICD-10-CM

## 2023-01-11 DIAGNOSIS — L630 Alopecia (capitis) totalis: Secondary | ICD-10-CM

## 2023-01-11 DIAGNOSIS — E782 Mixed hyperlipidemia: Secondary | ICD-10-CM

## 2023-01-11 DIAGNOSIS — I7121 Aneurysm of the ascending aorta, without rupture: Secondary | ICD-10-CM

## 2023-01-11 DIAGNOSIS — L2089 Other atopic dermatitis: Secondary | ICD-10-CM

## 2023-01-11 DIAGNOSIS — Z79899 Other long term (current) drug therapy: Secondary | ICD-10-CM

## 2023-01-11 DIAGNOSIS — M5030 Other cervical disc degeneration, unspecified cervical region: Secondary | ICD-10-CM

## 2023-01-11 DIAGNOSIS — I7300 Raynaud's syndrome without gangrene: Secondary | ICD-10-CM

## 2023-01-11 DIAGNOSIS — J309 Allergic rhinitis, unspecified: Secondary | ICD-10-CM

## 2023-01-11 DIAGNOSIS — M3509 Sicca syndrome with other organ involvement: Secondary | ICD-10-CM

## 2023-01-11 DIAGNOSIS — G6181 Chronic inflammatory demyelinating polyneuritis: Secondary | ICD-10-CM

## 2023-01-11 NOTE — Progress Notes (Deleted)
Office Visit Note  Patient: Devin Erickson             Date of Birth: 06/25/1970           MRN: [id number]             PCP: Jac Canavan, PA-C Referring: Jac Canavan, PA-C Visit Date: 01/20/2023 Occupation: @GUAROCC @  Subjective:  No chief complaint on file.   History of Present Illness: Devin Erickson is a 52 y.o. male ***   Patient was evaluated by Dr.Shah at [facility] on November 03, 2022.  After thorough evaluation Dr. 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  He did not advise any additional treatment based on patient's clinical findings.  He also diagnosed him Sjogren's based on the MRI findings of parotid gland changes and sicca symptoms.  No additional recommendations were made.  Activities of Daily Living:  Patient reports morning stiffness for *** {minute/hour:19697}.   Patient {ACTIONS;DENIES/REPORTS:21021675::"Denies"} nocturnal pain.  Difficulty dressing/grooming: {ACTIONS;DENIES/REPORTS:21021675::"Denies"} Difficulty climbing stairs: {ACTIONS;DENIES/REPORTS:21021675::"Denies"} Difficulty getting out of chair: {ACTIONS;DENIES/REPORTS:21021675::"Denies"} Difficulty using hands for taps, buttons, cutlery, and/or writing: {ACTIONS;DENIES/REPORTS:21021675::"Denies"}  No Rheumatology ROS completed.   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   Neuropathy, arm, right 01/20/2022   Radiculopathy, cervical region 10/15/2021   Left arm pain 06/18/2021   Upper back pain 06/18/2021   Decreased ROM of neck 06/18/2021   Screening for heart disease 02/05/2021   Screening for prostate cancer 02/05/2021   Sleep disturbance 02/05/2021   Chronic right-sided thoracic back pain 05/08/2020   Fatigue 07/10/2019   Raynaud's phenomenon without gangrene 07/10/2019   Erectile dysfunction 07/10/2019   At high risk for bleeding 07/10/2019    ANA positive 07/10/2019   Elevated liver function tests 03/08/2019   Need for influenza vaccination 02/16/2019   Vaccine counseling 02/16/2019   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   Chronic left shoulder pain 05/02/2018   Benign paroxysmal positional vertigo due to bilateral vestibular disorder 06/24/2017   Encounter for health maintenance examination in adult 07/22/2016   Chronic pain of right knee 07/22/2016   Atopic dermatitis 12/12/2015   Tension headache, chronic 10/04/2015   Cough variant asthma vs UACS 09/04/2015   Epistaxis 08/01/2014   Rhinitis, allergic 08/01/2014   Hyperlipidemia 08/01/2014   OSA (obstructive sleep apnea) 08/01/2014    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   History of sleep apnea 04/2012   per overnight screen/oximetry    Obesity    Thalassemia minor    Wears glasses    Wears glasses     Family History  Problem Relation Age of Onset   Cancer Mother 61       colon   Hypertension Mother    Dementia Father    Cancer Father        pancreas   Healthy Brother    Healthy Son    Heart disease Neg Hx    Lung disease Neg Hx    Past Surgical History:  Procedure Laterality Date   COLONOSCOPY  04/27/2010   [facility], normal per pt   TONSILLECTOMY     TONSILLECTOMY AND ADENOIDECTOMY     age 68yo  Social History   Social History Narrative   Married, son, Devin Erickson - Designer, television/film set, Set designer, exercise - going gym 4 days per week.  Jehoviah Witness.  01/2021         Right Handed    Lives in a one story home    Occasionally caffeine   Immunization History  Administered Date(s) Administered   Influenza Split 02/07/2009, 03/04/2010, 02/15/2012   Influenza,inj,Quad PF,6+ Mos 02/15/2012, 03/10/2013, 06/14/2014, 02/01/2015, 02/08/2017, 03/10/2018, 02/16/2019, 04/05/2020,  01/06/2021, 01/29/2022   Influenza-Unspecified 05/15/2016   Moderna Sars-Covid-2 Vaccination 07/13/2019, 08/10/2019   PFIZER Comirnaty(Gray Top)Covid-19 Tri-Sucrose Vaccine 08/23/2020   PFIZER(Purple Top)SARS-COV-2 Vaccination 03/02/2020   Pfizer Covid-19 Vaccine Bivalent Booster 79yrs & up 01/06/2021   Tdap 02/15/2012, 08/10/2012   Unspecified SARS-COV-2 Vaccination 01/29/2022   Zoster Recombinant(Shingrix) 06/08/2020, 08/07/2020     Objective: Vital Signs: There were no vitals taken for this visit.   Physical Exam   Musculoskeletal Exam: ***  CDAI Exam: CDAI Score: -- Patient Global: --; Provider Global: -- Swollen: --; Tender: -- Joint Exam 01/20/2023   No joint exam has been documented for this visit   There is currently no information documented on the homunculus. Go to the Rheumatology activity and complete the homunculus joint exam.  Investigation: No additional findings.  Imaging: MR CERVICAL SPINE W WO CONTRAST  Result Date: 01/06/2023 CLINICAL DATA:  Cervical Radiculopathy EXAM: MRI CERVICAL SPINE WITHOUT AND WITH CONTRAST TECHNIQUE: Multiplanar and multiecho pulse sequences of the cervical spine, to include the craniocervical junction and cervicothoracic junction, were obtained without and with intravenous contrast. CONTRAST:  10 mL Vueway, 0 mL wasted COMPARISON:  None Available. FINDINGS: Alignment: Physiologic. Vertebrae: No acute fracture or discitis. T1 hypointense bone lesion in the T1 vertebral body unchanged from the prior examination likely reflecting an atypical hemangioma. Cord: Normal signal and morphology. Posterior Fossa, vertebral arteries, paraspinal tissues: Posterior fossa demonstrates no focal abnormality. Vertebral artery flow voids are maintained. Mild edema in the inter pole spinous space at C5-6, C6-7, C7-T1 and T1-2 which may reflect ligament strain versus an inflammatory etiology. Disc levels: Discs: Degenerative disease with disc height loss  C3-4, C4-5, C5-6 and C6-7. C2-3: No significant disc bulge. No neural foraminal stenosis. No central canal stenosis. C3-4: Minimal broad-based disc bulge. Right uncovertebral degenerative changes. Moderate right foraminal stenosis. Mild left foraminal stenosis. No spinal stenosis. C4-5: Mild broad-based disc bulge. Mild bilateral foraminal stenosis. No spinal stenosis. C5-6: No significant disc bulge. No neural foraminal stenosis. No central canal stenosis. C6-7: Broad-based disc bulge with a left foraminal disc extrusion. Mild right foraminal stenosis. No left foraminal stenosis. No spinal stenosis. C7-T1: No significant disc bulge. No neural foraminal stenosis. No central canal stenosis. IMPRESSION: 1. Cervical spine spondylosis as described above. 2. No acute osseous injury of the cervical spine. Electronically Signed   By: Elige Ko M.D.   On: 01/06/2023 10:59    Recent Labs: Lab Results  Component Value Date   WBC 7.8 12/29/2022   HGB 11.4 (L) 12/29/2022   PLT 229.0 12/29/2022   NA 137 12/29/2022   K 3.8 12/29/2022   CL 104 12/29/2022   CO2 27 12/29/2022   GLUCOSE 93 12/29/2022   BUN 16 12/29/2022   CREATININE 1.21 12/29/2022   BILITOT 0.7 12/29/2022   ALKPHOS 45 12/29/2022   AST 28 12/29/2022   ALT 21 12/29/2022   PROT 6.8 12/29/2022   ALBUMIN 4.1 12/29/2022   CALCIUM 9.3 12/29/2022   GFRAA 84 02/16/2019   Sep 11, 2022 CK227,  aldolase 5.2, HMG CR antibody negative, myositis panel negative, urine protein creatinine ratio normal, C3-C4 normal, ANA > 1: 1280 centromere, nuclear, 1: 80 cytoplasmic, SCL 70 negative, RNP negative, Smith negative Labs done at [facility] :November 03, 2022 [id number] TSH normal, ANA 1: 2560 centromere, antimitochondrial antibody negative, ENA panel (antichromatin, antiribosomal P, SSA, SSB, Smith, RNP, SCL 70, Jo 1) negative, anticentromere positive  IMPRESSION: 1. No evidence of interstitial lung disease. No active pulmonary disease. 2. Three-vessel  coronary atherosclerosis. 3. Dilated 4.2 cm ascending thoracic aorta. Recommend annual imaging followup by CTA or MRA. This recommendation follows 2010 ACCF/AHA/AATS/ACR/ASA/SCA/SCAI/SIR/STS/SVM Guidelines for the Diagnosis and Management of Patients with Thoracic Aortic Disease. Circulation. 2010; 121: N829-F621. Aortic aneurysm NOS (ICD10-I71.9). 4.  Aortic Atherosclerosis (ICD10-I70.0).  Electronically Signed   By: Delbert Phenix M.D.   On: 10/18/2022 18:42  IMPRESSION: Ascending aortic aneurysm, 4.2 cm. Recommend annual imaging followup by CTA or MRA. This recommendation follows 2010 ACCF/AHA/AATS/ACR/ASA/SCA/SCAI/SIR/STS/SVM Guidelines for the Diagnosis and Management of Patients with Thoracic Aortic Disease. Circulation. 2010; 121: H086-V784. Aortic aneurysm NOS (ICD10-I71.9)     Electronically Signed   By: Narda Rutherford M.D.   On: 11/28/2022 23:26   Speciality Comments: No specialty comments available.  Procedures:  No procedures performed Allergies: Patient has no known allergies.   Assessment / Plan:     Visit Diagnoses: Limited systemic sclerosis (HCC)  ANA positive  Raynaud's phenomenon without gangrene  Elevated CK  DDD (degenerative disc disease), cervical  CIDP (chronic inflammatory demyelinating polyneuropathy) (HCC)  Alopecia totalis  Other atopic dermatitis  Elevated liver function tests  Mixed hyperlipidemia  Allergic rhinitis, unspecified seasonality, unspecified trigger  Cough variant asthma vs UACS  Orders: No orders of the defined types were placed in this encounter.  No orders of the defined types were placed in this encounter.   Face-to-face time spent with patient was *** minutes. Greater than 50% of time was spent in counseling and coordination of care.  Follow-Up Instructions: No follow-ups on file.   Pollyann Savoy, MD  Note - This record has been created using Animal nutritionist.  Chart creation errors have been  sought, but may not always  have been located. Such creation errors do not reflect on  the standard of medical care.

## 2023-01-20 ENCOUNTER — Ambulatory Visit: Payer: Self-pay | Admitting: Rheumatology

## 2023-01-20 DIAGNOSIS — J309 Allergic rhinitis, unspecified: Secondary | ICD-10-CM

## 2023-01-20 DIAGNOSIS — J45991 Cough variant asthma: Secondary | ICD-10-CM

## 2023-01-20 DIAGNOSIS — E782 Mixed hyperlipidemia: Secondary | ICD-10-CM

## 2023-01-20 DIAGNOSIS — L630 Alopecia (capitis) totalis: Secondary | ICD-10-CM

## 2023-01-20 DIAGNOSIS — R748 Abnormal levels of other serum enzymes: Secondary | ICD-10-CM

## 2023-01-20 DIAGNOSIS — M349 Systemic sclerosis, unspecified: Secondary | ICD-10-CM

## 2023-01-20 DIAGNOSIS — I7300 Raynaud's syndrome without gangrene: Secondary | ICD-10-CM

## 2023-01-20 DIAGNOSIS — L2089 Other atopic dermatitis: Secondary | ICD-10-CM

## 2023-01-20 DIAGNOSIS — G6181 Chronic inflammatory demyelinating polyneuritis: Secondary | ICD-10-CM

## 2023-01-20 DIAGNOSIS — M5030 Other cervical disc degeneration, unspecified cervical region: Secondary | ICD-10-CM

## 2023-01-20 DIAGNOSIS — R7989 Other specified abnormal findings of blood chemistry: Secondary | ICD-10-CM

## 2023-01-20 DIAGNOSIS — R768 Other specified abnormal immunological findings in serum: Secondary | ICD-10-CM

## 2023-01-23 ENCOUNTER — Other Ambulatory Visit: Payer: Self-pay | Admitting: Medical

## 2023-01-25 ENCOUNTER — Encounter: Payer: Self-pay | Admitting: Internal Medicine

## 2023-02-10 ENCOUNTER — Ambulatory Visit: Payer: Managed Care, Other (non HMO) | Admitting: Nurse Practitioner

## 2023-02-15 ENCOUNTER — Telehealth: Payer: Self-pay | Admitting: Neurology

## 2023-02-15 NOTE — Telephone Encounter (Signed)
Is he able to come for follow-up visit?  I can evaluate him this Wednesday at 11:10a.

## 2023-02-15 NOTE — Telephone Encounter (Signed)
Called patient and informed him that Dr. Allena Katz would like to see him this Wednesday at 11:10 am. Patient stated that he would need it to be a little later. Patient stated he can do 11:30 am but not 11:10 am because it would be cutting it close. I informed patient that I will let Dr. Allena Katz know to see what she would like to do and have someone call him from the front to get him scheduled for a time that will work.

## 2023-02-15 NOTE — Telephone Encounter (Signed)
Called patient and he informed me that for the past 4 days he feels like his hand has gotten worse. States his right hand is becoming hard to use and is "clawing up." Patient states this weekend he could barely use a fork. Patient also states that his right shoulder and right hand fingers are feeling very stiff and numb. Patient has been taking medication as prescribed.   Patient aware I will give him a call back once I hear back from Dr. Allena Katz.

## 2023-02-15 NOTE — Telephone Encounter (Signed)
Patient called stating that he is starting to have some issues and is wanting a call back from nurse

## 2023-02-15 NOTE — Telephone Encounter (Signed)
OK to add at 11:30a, but please emphasize that he needs to be here at 11:30a.  We can do a VV, if he prefers, but exam will be limited.

## 2023-02-16 NOTE — Telephone Encounter (Signed)
Called patient and informed him that Dr. Allena Katz can see him tomorrow at 11:30 and I have emphasized the important to please be here on time as Dr. Allena Katz is working him in. Patient will be here for his appointment tomorrow and thanked [place] for the call.

## 2023-02-17 ENCOUNTER — Ambulatory Visit (INDEPENDENT_AMBULATORY_CARE_PROVIDER_SITE_OTHER): Payer: Managed Care, Other (non HMO) | Admitting: Neurology

## 2023-02-17 ENCOUNTER — Encounter: Payer: Self-pay | Admitting: Neurology

## 2023-02-17 VITALS — BP 148/90 | HR 74 | Ht 75.0 in | Wt 272.0 lb

## 2023-02-17 DIAGNOSIS — G6181 Chronic inflammatory demyelinating polyneuritis: Secondary | ICD-10-CM | POA: Diagnosis not present

## 2023-02-17 MED ORDER — MYCOPHENOLATE MOFETIL 500 MG PO TABS: ORAL_TABLET | ORAL | 1 refills | Status: DC

## 2023-02-17 NOTE — Patient Instructions (Signed)
Increase Cellcept to 500mg  in the morning and 1000mg  at bedtime  We will explore your options for home IVIG infusion

## 2023-02-17 NOTE — Progress Notes (Signed)
Follow-up Visit   Date: 02/17/2023    Makyah Sprenger MRN: [id number] DOB: [birth year]    Flozell Bobbitt is a 52 y.o. right-handed African American male with hyperlipidemia and anxiety returning to the clinic for follow-up of polyradiculoneuropathy.  The patient was accompanied to the clinic by self.   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 He was unable to do IVIG due to time constraints and opted to start Cellcept. Over the last few weeks he has noticed gradual worsening of hand weakness.  - Increase Cellcept to 1500mg /d - We will contact home infusion agency to see availability to do IVIG infusion over the weekend  - Continue gabapentin 300mg  BID  2.  Cervical spondylosis with multilevel foraminal stenosis, mild-moderate at C5-5 and C6-7 which would not cause the severity of his hand weakness.  However, imaging did mention T1 hypointense lesion at T1 vertebral body ?hemangioma as well as numerous small cysts within the parotid glands.  Stable on repeat imaging.   3.  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    Return to clinic in 3 months   --------------------------------------------- History of present illness: Starting around April 2023, he began noticing mild weakness in the arm especially when weight lifting, it was more effortful on the right side.  Over the summer, he noticed weakness in grip on the right hand which continued to progress to the point where he is unable to use his hand to crank his ignition, turning keys to entire his home, grasping objects, and using utensils.  He is dropping things frequently.  He has difficulty straightening the hand.  He denies weakness with  raising the arm or bending at the elbow.  He has mild pain in the right shoulder, described as a nagging pain.  He treated it with tylenol.  Occasionally, he has numbness over the tips of the fingers.     He denies similar symptoms in the left hand.  No numbness/tingling in the feet.  He denies any ongoing pain or neck pain.  MRI cervical spine was performed in July which shows cervical spondylosis and multilevel foraminal stenosis at right C3-C4, bilateral C4-5 and left C6-7. Additionally, there was note of edema signal in the C6 and C7 spinous process as well as interspinous ligaments at C5-6, C6-7, C7-T1, and T1-T2.     NCS/EMG performed in September shows diffuse prolonged latency, slowed motor conduction velocity, and reduced amplitude involving the median and ulnar nerves.  Needle EMG shows active fibrillation potentials in the FDI, ABP, EDC, and biceps muscles.  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 Labs indicate positive ANA.   UPDATE 03/25/2022:  He is here to discuss results of EMG which favors a polyradiculoneuropathy affecting both arms, worse on the right.  He reports having discomfort in the left shoulder, similar to what he experienced in the beginning on the right.  No numbness/tinging of the left arm.  His right hand remains unchanged and continues to have weakness.  He has difficulty with fine motor tasks.   UPDATE 06/01/2022:  He is here for follow-up visit and discuss LP results.  There has been no significant change with his hand  weakness or tingling, which remains worse in the right hand.  No similar symptoms in the legs or imbalance.  LP shows elevated protein of 116, normal white count and presence of oligoclonal bands.  IgG is normal.   UPDATE 09/15/2022:  He is here for follow-up visit.  Since his last visit, he continued to have  additional monthly solumedrol and reports no significant change.  He is having mild tingling in the left hand and has noticed some weakness.   He was evaluated by Dr. 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    UPDATE 12/29/2022:  He is here for follow-up.  He has not noticed any new weakness of the hand and feels that symptoms stabilized with slight improvement.  He is tolerating Cellcept 500mg  BID.  No new weakness, numbness/tingling.  He will be seeing rheumatology next week.  He has noticed that it takes longer for him to get over a viral illness now.  He has a URI several weeks ago and only now starting to feel better.   UPDATE 02/17/2023:  He reports having right shoulder stiffness which started a few weeks ago.  More recently, he began noticing progressive weakness in the right hand.  He is unable to hold items in the right hand, such as a fork.  He has had a few spells of right leg buckling.  No new tingling or numbness.   Medications:  Current Outpatient Medications on File Prior to Visit  Medication Sig Dispense Refill   albuterol (VENTOLIN HFA) 108 (90 Base) MCG/ACT inhaler Inhale 2 puffs into the lungs every 6 (six) hours as needed for wheezing or shortness of breath. 8 g 1   albuterol (VENTOLIN HFA) 108 (90 Base) MCG/ACT inhaler Inhale 2 puffs into the lungs every 6 (six) hours as needed for wheezing or shortness of breath. 8 g 0   cetirizine (ZYRTEC) 10 MG tablet Take 10 mg by mouth daily.     gabapentin (NEURONTIN) 300 MG capsule Take 1 capsule (300 mg total) by mouth 2 (two) times daily. (Patient taking differently: Take 300 mg by mouth daily.) 60 capsule 5   Multiple Vitamin (MULTIVITAMIN) tablet Take 1 tablet by mouth daily.     mycophenolate (CELLCEPT) 500 MG tablet Take 1 tablet (500 mg total) by mouth 2 (two) times daily. 60 tablet 5   rosuvastatin (CRESTOR) 20 MG tablet TAKE 1 TABLET BY MOUTH EVERYDAY AT BEDTIME 90  tablet 0   No current facility-administered medications on file prior to visit.    Allergies: No Known Allergies  Vital Signs:  BP (!) 148/90   Pulse 74   Ht 6\' 3"  (1.905 m)   Wt 272 lb (123.4 kg)   SpO2 96%   BMI 34.00 kg/m   Neurological Exam: MENTAL STATUS including orientation to time, place, person, recent and remote memory, attention span and concentration, language, and fund of knowledge is normal.  Speech is not dysarthric.  CRANIAL NERVES:   Normal conjugate, extra-ocular eye movements in all directions of gaze.  No ptosis.  Face is symmetric. Palate elevates symmetrically.  Tongue is midline.  MOTOR:  Right FDI, ADM, ABP, and medial forearm atrophy.  No fasciculations or abnormal movements.  No pronator drift.    Upper Extremity:  Right   Left  Deltoid  5/5    5/5   Biceps  5/5    5/5   Triceps  5/5    5/5   Wrist  extensors  5/5    5/5   Wrist flexors  4/5    5/5   Finger extensors  3-/5    5/5   Finger flexors  3+/5    5/5   Dorsal interossei  1/5    5-/5   Abductor pollicis  3/5    5/5   Tone (Ashworth scale)  0   0    Lower Extremity:  Right   Left  Hip flexors  5/5    5/5   Knee flexors  5/5    5/5   Dorsiflexors  5/5    5/5   Plantarflexors  5/5    5/5   Toe extensors  5/5    5/5   Toe flexors  5/5    5/5   Tone (Ashworth scale)  0   0    MSRs:                                              Right        Left brachioradialis tr   1+  biceps tr   1+  triceps tr   1+  patellar 1+   1+  ankle jerk 1+   1+  Hoffman no   no  plantar response down   down    SENSORY:  Reduced temperature in the right palm, compared to the left.  Sensation intact in the legs.     COORDINATION/GAIT: Normal finger-to- nose-finger.  Finger tapping slowed on the right due to weakness.   Gait narrow based and stable.   Data: Labs 02/18/2022:  vitamin B12 880, folate > 23, CRP <1.0, ESR 16, TSH 1.65, ANCA neg, ACE 33, copper 110, SPEP with IFE no M protein, cryo neg, vitamin  B1 7*, ANA positive, centromere antibody 1:640*  NCS/EMG of bilateral arms 03/24/2022 performed at [facility] Neurology: This is a complex study.  Findings are most suggestive of a subacute demyelinating and axonal polyradiculoneuropathy affecting the upper extremities, which is worse on the right.    NCS/EMG of the right arm 01/08/2022 performed at [facility]: EMG & NCV Findings:  Impression: The above electrodiagnostic study is ABNORMAL but is difficult to fully interpret.  The needle EMG shows significant denervation but active motor unit potentials in multiple muscles which could be consistent with cervical stenosis but the MRI does not show any cervical stenosis.  This could be a brachial plexus type lesion although it does not seem to fit to a specific trauma although it is closest to an upper trunk brachial plexus lesion.  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    However the patient seems to have a concomitant median neuropathy at the wrist but also slowing of the ulnar nerve distal to the elbow and this would represent more of a demyelination type lesion on top of what was seen with the denervation potentials.     MRI cervical spine wo contrast 11/03/21: Intermittently motion degraded exam.   Indeterminate 18 mm T2 STIR hyperintense and T1 hypointense lesion within the T1 vertebral body. While this may reflect an atypical hemangioma, alternative etiologies (including osseous metastatic disease) cannot be excluded. Short-interval 2-3 month MRI follow-up without and with contrast is recommended to monitor this finding.   Marrow edema within the C6 and C7 spinous processes. Edema signal also present within the interspinous ligaments at C5-C6, C6-C7, C7-T1  and T1-T2. Findings are nonspecific, but possibly degenerative or posttraumatic in etiology. If there has been recent trauma, the interspinous ligament edema could reflect interspinous ligament injury and a cervical spine CT should be  considered to exclude C6/C7 spinous process fractures. Additionally, attention to these sites recommended at MRI imaging follow-up.   Cervical spondylosis, as outlined. No more than mild relative spinal canal narrowing. Multilevel foraminal stenosis, as detailed and greatest on the right at C3-C4 (moderate), bilaterally at C4-C5 (mild-to-moderate) and on the left at C6-C7 (mild-to-moderate).   Nonspecific straightening of the expected cervical lordosis.  CSF 05/26/2022:  R0 W1 P116*  G56   ACE 5, IgG index 0.54, MBP pending, OCB present*, cytology negative  Total time spent reviewing records, interview, history/exam, documentation, and coordination of care on day of encounter:  30 min    Thank you for allowing me to participate in patient's care.  If I can answer any additional questions, I would be pleased to do so.    Sincerely,    Latif Nazareno K. Allena Katz, DO /

## 2023-03-01 ENCOUNTER — Telehealth: Payer: Self-pay | Admitting: Neurology

## 2023-03-01 ENCOUNTER — Ambulatory Visit: Payer: 59 | Admitting: Medical

## 2023-03-01 VITALS — BP 120/70 | HR 89 | Temp 98.3°F | Wt 274.0 lb

## 2023-03-01 DIAGNOSIS — R29898 Other symptoms and signs involving the musculoskeletal system: Secondary | ICD-10-CM

## 2023-03-01 DIAGNOSIS — M549 Dorsalgia, unspecified: Secondary | ICD-10-CM

## 2023-03-01 DIAGNOSIS — M79601 Pain in right arm: Secondary | ICD-10-CM

## 2023-03-01 MED ORDER — CYCLOBENZAPRINE HCL 10 MG PO TABS: 10.0000 mg | ORAL_TABLET | Freq: Two times a day (BID) | ORAL | 0 refills | Status: DC | PRN

## 2023-03-01 NOTE — Progress Notes (Signed)
Subjective:  Devin Erickson is a 52 y.o. male who presents for Chief Complaint  Patient presents with   right shoulder    Right shoulder pain x 2-3 weeks. Always stiff     Here for 2-3 week hx/o pains in right upper back/shoulder.  Stiff at times, tight.   He reports more of a discomfort than a pain.  No fall, no injury, no trauma.  Wonders if this is related to his autoimmune issues.   In the gym not having the same strength as he was having.  He notes in the last week or 2 when he tries to do tricep exercises or shoulder exercises he is way weaker on the right side than typical.  No numbness or tingling.  He has been doing some exercises to help with his neck range of motion.  No other aggravating or relieving factors.    No other c/o.  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 8 g 1   cetirizine (ZYRTEC) 10 MG tablet Take 10 mg by mouth daily.     gabapentin (NEURONTIN) 300 MG capsule Take 1 capsule (300 mg total) by mouth 2 (two) times daily. (Patient taking differently: Take 300 mg by mouth daily.) 60 capsule 5   Multiple Vitamin (MULTIVITAMIN) tablet Take 1 tablet by mouth daily.     mycophenolate (CELLCEPT) 500 MG tablet Take 1 tablet in the morning and 2 tablet at bedtime. 270 tablet 1   rosuvastatin (CRESTOR) 20 MG tablet TAKE 1 TABLET BY MOUTH EVERYDAY AT BEDTIME 90 tablet 0   albuterol (VENTOLIN HFA) 108 (90 Base) MCG/ACT inhaler Inhale 2 puffs  into the lungs every 6 (six) hours as needed for wheezing or shortness of breath. 8 g 0   No current facility-administered medications on file prior to visit.     The following portions of the patient's history were reviewed and updated as appropriate: allergies, current medications, past family history, past medical history, past social history, past surgical history and problem list.  ROS Otherwise as in subjective above  Objective: BP 120/70   Pulse 89   Temp 98.3 F (36.8 C)   Wt 274 lb (124.3 kg)   BMI 34.25 kg/m   General appearance: alert, no distress, well developed, well nourished Neck: Nontender, quite decreased neck extension, and somewhat decreased right lateral rotation but rest range of motion okay, no mass, no lymphadenopathy or thyromegaly MSK: Nontender of the right deltoid or arm, no swelling, no deformity, range of motion seems relatively full.  Maybe slight decreased internal and external range of motion but mostly full Back: Nontender, no obvious swelling or deformity of the upper back Arms with normal pulse and cap refill Strength seems slightly reduced on the right arm compared to the left but overall fairly normal strength and sensation   Assessment: Encounter  Diagnoses  Name Primary?   Decreased range of motion of neck Yes   Upper back pain    Pain of right upper extremity      Plan: We discussed his symptoms and concerns.  He has chronic inflammatory demyelinating polyneuropathy of the right side and I suspect his symptoms are related to this.  He can use the muscle laxer the next few nights given the tightness and possible spasm.  He does not seem to have any significant shoulder pain or discomfort with shoulder range of motion today.  He notes more tightness in the upper back  But given his underlying CIDP diagnosis and given the recent weakness with doing weightlifting exercises I suspect that is more the culprit of his issue.  I will reach out  to his neurologist and let them know about his concerns as well  Devin Erickson was seen today for right shoulder.  Diagnoses and all orders for this visit:  Decreased range of motion of neck  Upper back pain  Pain of right upper extremity  Other orders -     cyclobenzaprine (FLEXERIL) 10 MG tablet; Take 1 tablet (10 mg total) by mouth 2 (two) times daily as needed for muscle spasms.    Follow up: with neurology

## 2023-03-01 NOTE — Telephone Encounter (Signed)
Pharmacy did get the RX on 10/32 he misunderstood talking to the DR he thought it was going to be 750 mg not 500 mg after talking to the pt he is going to call the pharmacy and have to mail it out to him.

## 2023-03-01 NOTE — Telephone Encounter (Signed)
Patient called regarding medication  mycophenolate (CELLCEPT) 500 MG tablet   He states during last visit him and provider spoke of adjusting medication. Pharmacy has yet to receive it.

## 2023-03-09 NOTE — Progress Notes (Signed)
Pt was notified of results

## 2023-03-10 ENCOUNTER — Telehealth: Payer: Self-pay | Admitting: Neurology

## 2023-03-10 NOTE — Telephone Encounter (Signed)
IVIG orders signed and ready to fax. Best to see how he responds to this then then decide the next step.  If he develops worsening right arm symptoms even after getting IVIG, we will repeat MRI cervical spine to be sure there is nothing else contributing to this change.

## 2023-03-10 NOTE — Telephone Encounter (Signed)
Caller stated he was advised by PCP to get sooner appt. Pt would like to speak with someone about concerns. Pt already has appt scheduled in January

## 2023-03-10 NOTE — Telephone Encounter (Signed)
Called patient and informed him of below per Dr. Allena Katz. Patient verbalized understanding and had no further questions or concerns.

## 2023-03-10 NOTE — Telephone Encounter (Signed)
PCP, Tysinger stated that patients shoulder and arm is getting worse. PCP would ike Dr. Allena Katz to see patient sooner. Patient states from the last time he was seen by Dr. Allena Katz he feels things have gotten worse. When  laying down patient feels like a sprain and bone is coming through his arm.   I did mention to patient that we have found a location for infusions that can accommodate his schedule. Patient would ike to proceed with [facility].

## 2023-03-16 NOTE — Progress Notes (Unsigned)
Office Visit    Patient Name: Devin Erickson Date of Encounter: 03/19/2023  Primary Care Provider:  Jac Canavan, PA-C Primary Cardiologist:  Peter Swaziland, MD  Chief Complaint    52 year old male with a history of abnormal EKG, coronary artery calcification noted on CT, mild dilation of ascending aorta, hyperlipidemia, chronic inflammatory demyelinating polyradiculoneuropathy, cervical spondylosis, scleroderma, Raynaud's phenomenon, anxiety, OSA and obesity who presents for follow-up related to coronary artery calcification.   Past Medical History    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   History of sleep apnea 04/2012   per overnight screen/oximetry    Obesity    Thalassemia minor    Wears glasses    Wears glasses    Past Surgical History:  Procedure Laterality Date   COLONOSCOPY  04/27/2010   [facility] physicians, normal per pt   TONSILLECTOMY     TONSILLECTOMY AND ADENOIDECTOMY     age 68yo    Allergies  No Known Allergies   Labs/Other Studies Reviewed    The following studies were reviewed today:  Cardiac Studies & Procedures       ECHOCARDIOGRAM  ECHOCARDIOGRAM COMPLETE 11/24/2022  Narrative ECHOCARDIOGRAM REPORT    Patient Name:   Devin Erickson Date of Exam: 11/24/2022 Medical Rec #:  [id number]          Height:       75.0 in Accession #:    [id number]         Weight:       236.4 lb Date of Birth:  Jun 30, 1970          BSA:          2.356 m Patient Age:    52 years           BP:           126/78 mmHg Patient Gender: M                  HR:           76 bpm. Exam Location:  [address]  Procedure: 2D Echo, Cardiac Doppler, Color Doppler and Strain Analysis  Indications:    I25.10 CAD  History:        Patient has no prior history of Echocardiogram examinations. CAD,  Abnormal ECG, Dilated ascending aorta; Risk Factors:Dyslipidemia.  Sonographer:    Samule Ohm RDCS Referring Phys: [phone] Rabia Argote C Elisa Kutner  IMPRESSIONS   1. Left ventricular ejection fraction, by estimation, is 60 to 65%. The left ventricle has normal function. The left ventricle has no regional wall motion abnormalities. Left ventricular diastolic parameters were normal. The average left ventricular global longitudinal strain is -21.2 %. The global longitudinal strain is normal. 2. Right ventricular systolic function is normal. The right ventricular size is normal. There is normal pulmonary artery systolic pressure. The estimated right ventricular systolic pressure is 25.5 mmHg. 3. The mitral valve is grossly normal. Trivial mitral valve regurgitation. No evidence of mitral stenosis. 4. The aortic valve is tricuspid. Aortic valve regurgitation is not visualized. No aortic stenosis is present. 5. The inferior vena cava is normal in size with greater than 50% respiratory variability, suggesting right atrial pressure of 3 mmHg.  FINDINGS Left Ventricle: Left ventricular ejection fraction, by estimation,  is 60 to 65%. The left ventricle has normal function. The left ventricle has no regional wall motion abnormalities. The average left ventricular global longitudinal strain is -21.2 %. The global longitudinal strain is normal. The left ventricular internal cavity size was normal in size. There is no left ventricular hypertrophy. Left ventricular diastolic parameters were normal.  Right Ventricle: The right ventricular size is normal. No increase in right ventricular wall thickness. Right ventricular systolic function is normal. There is normal pulmonary artery systolic pressure. The tricuspid regurgitant velocity is 2.37 m/s, and with an assumed right atrial pressure of 3 mmHg, the estimated right ventricular systolic pressure is 25.5 mmHg.  Left Atrium: Left atrial size was normal in  size.  Right Atrium: Right atrial size was normal in size.  Pericardium: There is no evidence of pericardial effusion.  Mitral Valve: The mitral valve is grossly normal. Trivial mitral valve regurgitation. No evidence of mitral valve stenosis.  Tricuspid Valve: The tricuspid valve is grossly normal. Tricuspid valve regurgitation is mild . No evidence of tricuspid stenosis.  Aortic Valve: The aortic valve is tricuspid. Aortic valve regurgitation is not visualized. Aortic regurgitation PHT measures 547 msec. No aortic stenosis is present.  Pulmonic Valve: The pulmonic valve was grossly normal. Pulmonic valve regurgitation is trivial. No evidence of pulmonic stenosis.  Aorta: The aortic root and ascending aorta are structurally normal, with no evidence of dilitation.  Venous: The inferior vena cava is normal in size with greater than 50% respiratory variability, suggesting right atrial pressure of 3 mmHg.  IAS/Shunts: There is redundancy of the interatrial septum. The atrial septum is grossly normal.   LEFT VENTRICLE PLAX 2D LVIDd:         4.80 cm   Diastology LVIDs:         3.00 cm   LV e' medial:    8.81 cm/s LV PW:         1.06 cm   LV E/e' medial:  7.6 LV IVS:        0.92 cm   LV e' lateral:   10.00 cm/s LVOT diam:     2.30 cm   LV E/e' lateral: 6.7 LV SV:         108 LV SV Index:   46        2D Longitudinal Strain LVOT Area:     4.15 cm  2D Strain GLS Avg:     -21.2 %   RIGHT VENTRICLE             IVC RV S prime:     19.10 cm/s  IVC diam: 1.50 cm TAPSE (M-mode): 2.6 cm RVSP:           25.5 mmHg  LEFT ATRIUM              Index        RIGHT ATRIUM           Index LA diam:        4.20 cm  1.78 cm/m   RA Pressure: 3.00 mmHg LA Vol (A2C):   100.0 ml 42.44 ml/m  RA Area:     16.90 cm LA Vol (A4C):   69.8 ml  29.63 ml/m  RA Volume:   46.60 ml  19.78 ml/m LA Biplane Vol: 91.3 ml  38.75 ml/m AORTIC VALVE LVOT Vmax:   131.00 cm/s LVOT Vmean:  81.600 cm/s LVOT VTI:     0.261 m AI PHT:      547 msec  AORTA Ao Root diam: 4.00 cm Ao Asc diam:  3.80 cm  MITRAL VALVE               TRICUSPID VALVE MV Area (PHT): 3.48 cm    TR Peak grad:   22.5 mmHg MV Decel Time: 218 msec    TR Vmax:        237.00 cm/s MV E velocity: 67.10 cm/s  Estimated RAP:  3.00 mmHg MV A velocity: 83.80 cm/s  RVSP:           25.5 mmHg MV E/A ratio:  0.80 SHUNTS Systemic VTI:  0.26 m Systemic Diam: 2.30 cm  Lennie Odor MD Electronically signed by Lennie Odor MD Signature Date/Time: 11/24/2022/8:26:13 PM    Final     CT SCANS  CT CORONARY MORPH W/CTA COR W/SCORE 11/19/2022  Addendum 11/28/2022 11:28 PM ADDENDUM REPORT: 11/28/2022 23:26  EXAM: OVER-READ INTERPRETATION  CT CHEST  The following report is an over-read performed by radiologist Dr. Narda Rutherford of [facility] on 11/28/2022. This over-read does not include interpretation of cardiac or coronary anatomy or pathology. The coronary CTA interpretation by the cardiologist is attached.  COMPARISON:  High-resolution chest CT 10/13/2022  FINDINGS: Vascular: Ascending aorta is mildly dilated at 4.2 cm.  Mediastinum/nodes: No adenopathy or mass.  Small hiatal hernia.  Lungs: No focal airspace disease. No pulmonary nodule. No pleural fluid. The included airways are patent.  Upper abdomen: No acute findings. Enhancing focus in the periphery of the right lobe of the liver has the appearance of an AP shunt.  Musculoskeletal: There are no acute or suspicious osseous abnormalities.  IMPRESSION: Ascending aortic aneurysm, 4.2 cm. Recommend annual imaging followup by CTA or MRA. This recommendation follows 2010 ACCF/AHA/AATS/ACR/ASA/SCA/SCAI/SIR/STS/SVM Guidelines for the Diagnosis and Management of Patients with Thoracic Aortic Disease. Circulation. 2010; 121: K742-V956. Aortic aneurysm NOS (ICD10-I71.9)   Electronically Signed By: Narda Rutherford M.D. On: 11/28/2022  23:26  Narrative HISTORY: ECG abnormal, high CAD risk CAD screening, normal prior > 2 yrs abnormal ekg, coronary calcification on ct  EXAM: Cardiac/Coronary CT  TECHNIQUE: The patient was scanned on a [employer].  PROTOCOL: A 120 kV retrospective scan was triggered in the descending thoracic aorta at 111 HU's. Axial non-contrast 3 mm slices were carried out through the heart. The data set was analyzed on a dedicated work station and scored using the Agatston method. Gantry rotation speed was 250 msecs and collimation was 0.6 mm. Heart rate was optimized medically and sl NTG was given. The 3D data set was reconstructed in intervals of the R-R cycle. Systolic and diastolic phases were analyzed on a dedicated work station using MPR, MIP and VRT modes. The patient received 95mL OMNIPAQUE IOHEXOL 350 MG/ML SOLN of contrast.  FINDINGS: Coronary calcium score: The patient's coronary artery calcium score is 665, which places the patient in the 98th percentile.  Coronary arteries: Normal coronary origins.  Right dominance.  Right Coronary Artery: Normal caliber vessel, gives rise to PDA. Scattered mixed calcified and noncalcified plaque in proximal and mid vessel with maximum with 25-49% stenosis in proximal segment. Focal step artifact in ostial/proximal vessel as well as mid vessel. Ostial vessel can be seen in some of the phases, does not appear to have severe stenosis.  Left Main Coronary Artery: Normal caliber vessel. No significant plaque or stenosis.  Ramus intermedius: Minimal mixed calcified and noncalcified plaque at ostium with 1-24% stenosis.  Left Anterior Descending Coronary Artery: Normal caliber vessel. Scattered mixed calcified  and noncalcified plaque in proximal and mid vessel with maximum with 1-24% stenosis. Focal step artifact in mid vessel. Gives rise to medium first, small second, small third, small fourth diagonal branches.  Left Circumflex  Artery: Normal caliber vessel. Scattered mixed calcified and noncalcified plaque in proximal and mid vessel with maximum with 1-24% stenosis. Focal step artifact in mid vessel. Gives rise to three medium OM branches.  Aorta: Mildly dilated size, 42.6 mm at the mid ascending aorta (level of the PA bifurcation) measured double oblique. No aortic atherosclerosis. No dissection seen in visualized portions of the aorta.  Aortic Valve: No calcifications. Trileaflet.  Other findings:  Normal pulmonary vein drainage into the left atrium.  Normal left atrial appendage without a thrombus.  Normal size of the pulmonary artery.  Normal appearance of the pericardium.  Significant step artifact.  Plaque volume cannot be interpreted.  IMPRESSION: 1. Diffuse mild nonobstructive CAD, CADRADS = 2. There is focal step artifact as noted above, which limits interpretation in those specific areas.  2. Coronary calcium score of 665. This was 98th percentile for age-, sex-, and race- matched controls.  3. Normal coronary origin with right dominance.  4. Mildly dilated ascending aorta size, 42.6 mm  INTERPRETATION:  CAD-RADS 2: Mild non-obstructive CAD (25-49%). Consider non-atherosclerotic causes of chest pain. Consider preventive therapy and risk factor modification.  Electronically Signed: By: Jodelle Red M.D. On: 11/23/2022 06:51         Recent Labs: 12/29/2022: ALT 21; BUN 16; Creatinine, Ser 1.21; Hemoglobin 11.4; Platelets 229.0; Potassium 3.8; Sodium 137  Recent Lipid Panel    Component Value Date/Time   CHOL 202 (H) 02/05/2021 1552   TRIG 88 02/05/2021 1552   HDL 52 02/05/2021 1552   CHOLHDL 3.9 02/05/2021 1552   CHOLHDL 3.7 07/22/2016 1443   VLDL 19 07/22/2016 1443   LDLCALC 134 (H) 02/05/2021 1552    History of Present Illness    52 year old male with the above past medical history including abnormal EKG, coronary artery calcification noted on CT, mild  dilation of ascending aorta, for lipidemia, chronic inflammatory demyelinating polyradiculoneuropathy, cervical spondylosis, scleroderma, Raynaud's phenomenon, anxiety, OSA, and obesity.   He was remotely seen by Dr. Anne Fu and apparently had a normal stress echo in 2012. He was re-referred to cardiology per his PCP in the setting of abnormal EKG. He follows with rheumatology  for management of limited systemic sclerosis. Recent high-resolution CT chest per Rheumatology showed no evidence of ILD, however, it did show three-vessel coronary atherosclerosis, aortic atherosclerosis, dilated ascending thoracic aorta, measuring 42 mm. He was last seen in the office on 10/30/2022  and was stable from a cardiac standpoint.  He denied symptoms concerning for angina.  Coronary CT angiogram in 10/2022 revealed coronary calcium score of 665 (90th percentile), mild nonobstructive CAD (CADRADS=2), mild dilation ascending aorta measuring 42.6 mm.  Echocardiogram in 10/2022 showed EF 60 to 65%, no RWMA, normal RV systolic function, normal PASP, no significant valvular abnormalities.  He presents today for follow-up.  Since his last visit he has been stable from a cardiac standpoint.  He denies any symptoms concerning for angina.  He has been dealing with some right shoulder pain and is following with neurology/rheumatology for this.  Otherwise, he reports feeling well.   Home Medications    Current Outpatient Medications  Medication Sig Dispense Refill   albuterol (VENTOLIN HFA) 108 (90 Base) MCG/ACT inhaler Inhale 2 puffs into the lungs every 6 (six) hours as needed for wheezing  or shortness of breath. 8 g 1   albuterol (VENTOLIN HFA) 108 (90 Base) MCG/ACT inhaler Inhale 2 puffs into the lungs every 6 (six) hours as needed for wheezing or shortness of breath. 8 g 0   cetirizine (ZYRTEC) 10 MG tablet Take 10 mg by mouth daily.     cyclobenzaprine (FLEXERIL) 10 MG tablet Take 1 tablet (10 mg total) by mouth 2 (two) times  daily as needed for muscle spasms. 20 tablet 0   gabapentin (NEURONTIN) 300 MG capsule Take 1 capsule (300 mg total) by mouth 2 (two) times daily. (Patient taking differently: Take 300 mg by mouth daily.) 60 capsule 5   Multiple Vitamin (MULTIVITAMIN) tablet Take 1 tablet by mouth daily.     mycophenolate (CELLCEPT) 500 MG tablet Take 1 tablet in the morning and 2 tablet at bedtime. 270 tablet 1   rosuvastatin (CRESTOR) 20 MG tablet TAKE 1 TABLET BY MOUTH EVERYDAY AT BEDTIME 90 tablet 0   No current facility-administered medications for this visit.     Review of Systems    He denies chest pain, palpitations, dyspnea, pnd, orthopnea, n, v, dizziness, syncope, edema, weight gain, or early satiety. All other systems reviewed and are otherwise negative except as noted above.    Physical Exam    VS:  BP 126/88 (BP Location: Right Arm, Patient Position: Sitting, Cuff Size: Large)   Pulse 79   Ht 6\' 3"  (1.905 m)   Wt 277 lb 9.6 oz (125.9 kg)   SpO2 98%   BMI 34.70 kg/m  GEN: Well nourished, well developed, in no acute distress. HEENT: normal. Neck: Supple, no JVD, carotid bruits, or masses. Cardiac: RRR, no murmurs, rubs, or gallops. No clubbing, cyanosis, edema.  Radials/DP/PT 2+ and equal bilaterally.  Respiratory:  Respirations regular and unlabored, clear to auscultation bilaterally. GI: Soft, nontender, nondistended, BS + x 4. MS: no deformity or atrophy. Skin: warm and dry, no rash. Neuro:  Strength and sensation are intact. Psych: Normal affect.  Accessory Clinical Findings    ECG personally reviewed by me today -    - no EKG in office today.   Lab Results  Component Value Date   WBC 7.8 12/29/2022   HGB 11.4 (L) 12/29/2022   HCT 34.5 (L) 12/29/2022   MCV 68.7 Repeated and verified X2. (L) 12/29/2022   PLT 229.0 12/29/2022   Lab Results  Component Value Date   CREATININE 1.21 12/29/2022   BUN 16 12/29/2022   NA 137 12/29/2022   K 3.8 12/29/2022   CL 104 12/29/2022    CO2 27 12/29/2022   Lab Results  Component Value Date   ALT 21 12/29/2022   AST 28 12/29/2022   ALKPHOS 45 12/29/2022   BILITOT 0.7 12/29/2022   Lab Results  Component Value Date   CHOL 202 (H) 02/05/2021   HDL 52 02/05/2021   LDLCALC 134 (H) 02/05/2021   TRIG 88 02/05/2021   CHOLHDL 3.9 02/05/2021    Lab Results  Component Value Date   HGBA1C 5.4 07/29/2017    Assessment & Plan   1. History of abnormal EKG/coronary artery calcification noted on CT/aortic atherosclerosis: EKG previously felt to be stable overall.Coronary CT angiogram in 10/2022 revealed coronary calcium score of 665 (90th percentile), mild nonobstructive CAD (CADRADS=2), mild dilation ascending aorta measuring 42.6 mm. Echocardiogram in 10/2022 showed EF 60 to 65%, no RWMA, normal RV systolic function, normal PASP, no significant valvular abnormalities. Stable with no anginal symptoms. Continue Crestor.   2.  Dilation of ascending aorta: Dilation of the ascending thoracic aorta noted on recent CT chest, measuring 42 mm.  Plan for repeat CT chest/aorta or routine monitoring in July 2025.     3. Hyperlipidemia: No recent LDL on file.  He will have fasting lipid panel with his PCP in January 2025. If LDL is above goal, consider escalation of statin therapy.  Continue Crestor.   4. Limited systemic sclerosis: Following with rheumatology.    5. Chronic inflammatory demyelinating polyradiculoneuropathy: Follows with neurology.    6. OSA: No longer on CPAP in the setting of significant weight loss.   7. Obesity: Encouraged ongoing lifestyle modifications with diet and exercise.    8. Disposition: Follow-up in 6 months, sooner if needed.       Joylene Grapes, NP 03/19/2023, 4:40 PM

## 2023-03-19 ENCOUNTER — Encounter: Payer: Self-pay | Admitting: Nurse Practitioner

## 2023-03-19 ENCOUNTER — Ambulatory Visit: Payer: Managed Care, Other (non HMO) | Attending: Nurse Practitioner | Admitting: Nurse Practitioner

## 2023-03-19 VITALS — BP 126/88 | HR 79 | Ht 75.0 in | Wt 277.6 lb

## 2023-03-19 DIAGNOSIS — E785 Hyperlipidemia, unspecified: Secondary | ICD-10-CM | POA: Diagnosis not present

## 2023-03-19 DIAGNOSIS — G6181 Chronic inflammatory demyelinating polyneuritis: Secondary | ICD-10-CM | POA: Diagnosis not present

## 2023-03-19 DIAGNOSIS — I77810 Thoracic aortic ectasia: Secondary | ICD-10-CM

## 2023-03-19 DIAGNOSIS — I2510 Atherosclerotic heart disease of native coronary artery without angina pectoris: Secondary | ICD-10-CM | POA: Diagnosis not present

## 2023-03-19 DIAGNOSIS — G4733 Obstructive sleep apnea (adult) (pediatric): Secondary | ICD-10-CM

## 2023-03-19 NOTE — Patient Instructions (Signed)
Medication Instructions:  Your physician recommends that you continue on your current medications as directed. Please refer to the Current Medication list given to you today.  *If you need a refill on your cardiac medications before your next appointment, please call your pharmacy*   Lab Work: NONE ordered at this time of appointment    Testing/Procedures: NONE ordered at this time of appointment    Follow-Up: At [facility], you and your health needs are our priority.  As part of our continuing mission to provide you with exceptional heart care, we have created designated Provider Care Teams.  These Care Teams include your primary Cardiologist (physician) and Advanced Practice Providers (APPs -  Physician Assistants and Nurse Practitioners) who all work together to provide you with the care you need, when you need it.  We recommend signing up for the patient portal called "MyChart".  Sign up information is provided on this After Visit Summary.  MyChart is used to connect with patients for Virtual Visits (Telemedicine).  Patients are able to view lab/test results, encounter notes, upcoming appointments, etc.  Non-urgent messages can be sent to your provider as well.   To learn more about what you can do with MyChart, go to [email or web address].    Your next appointment:   6 month(s)  Provider:   Peter Swaziland, MD     Other Instructions

## 2023-03-21 ENCOUNTER — Encounter: Payer: Self-pay | Admitting: Nurse Practitioner

## 2023-03-31 ENCOUNTER — Telehealth: Payer: Self-pay

## 2023-03-31 NOTE — Telephone Encounter (Signed)
[facility] called and is requesting additional notes to submit for an appeal. All patients records, labs, imaging from Dr. Allena Katz has been printed and faxed to [phone].

## 2023-04-06 ENCOUNTER — Telehealth: Payer: 59 | Admitting: Medical

## 2023-04-06 VITALS — Wt 260.0 lb

## 2023-04-06 DIAGNOSIS — R0981 Nasal congestion: Secondary | ICD-10-CM | POA: Diagnosis not present

## 2023-04-06 DIAGNOSIS — J309 Allergic rhinitis, unspecified: Secondary | ICD-10-CM

## 2023-04-06 DIAGNOSIS — R040 Epistaxis: Secondary | ICD-10-CM | POA: Diagnosis not present

## 2023-04-06 MED ORDER — BECLOMETHASONE DIPROPIONATE 80 MCG/ACT NA AERS: 1.0000 | INHALATION_SPRAY | Freq: Two times a day (BID) | NASAL | 1 refills | Status: DC

## 2023-04-06 MED ORDER — IPRATROPIUM BROMIDE 0.06 % NA SOLN: 2.0000 | Freq: Four times a day (QID) | NASAL | 0 refills | Status: DC

## 2023-04-06 NOTE — Progress Notes (Signed)
Subjective:     Patient ID: Devin Erickson, male   DOB: December 03, 1970, 52 y.o.   MRN: [id number]  This visit type was conducted due to national recommendations for restrictions regarding the COVID-19 Pandemic (e.g. social distancing) in an effort to limit this patient's exposure and mitigate transmission in our community.  Due to their co-morbid illnesses, this patient is at least at moderate risk for complications without adequate follow up.  This format is felt to be most appropriate for this patient at this time.    Documentation for virtual audio and video telecommunications through [place] encounter:  The patient was located at home. The provider was located in the office. The patient did consent to this visit and is aware of possible charges through their insurance for this visit.  The other persons participating in this telemedicine service were none. Time spent on call was 20 minutes and in review of previous records 20 minutes total.  This virtual service is not related to other E/M service within previous 7 days.   HPI Chief Complaint  Patient presents with   bad congestion    Bad congestion, trouble breathing out of nose. Not getting sleep cause he can't breath. Having some nose bleeded   Here for cold symptoms. He notes 1.5 week hx/o congestion, trouble breathing out of nose.  Really bad nasal congestion, worse at night.   Had cold symptoms but most of those resolved, but now ongoing nasal congestion.  Using humidifier, sudafed.  Using some Flonase, but that hasn't helped a lot. Using sudafed OTC BID.  Not currently using antihistamine.   Has used nasal saline.     Getting out clear nasal mucous.   Having some nose bleed intermittent.    No sick contacts currently.  Has used afrin several times a day for the last 7 days.    No other aggravating or relieving factors. No other complaint.    Past Medical History:  Diagnosis Date   Anxiety    Depression 07/2017   in  remission   Eczema    H/O exercise stress test 2009   [facility], normal per pt   Hair loss    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   History of sleep apnea 04/2012   per overnight screen/oximetry    Obesity    Thalassemia minor    Wears glasses    Wears glasses    Current Outpatient Medications on File Prior to Visit  Medication Sig Dispense Refill   albuterol (VENTOLIN HFA) 108 (90 Base) MCG/ACT inhaler Inhale 2 puffs into the lungs every 6 (six) hours as needed for wheezing or shortness of breath. 8 g 1   cetirizine (ZYRTEC) 10 MG tablet Take 10 mg by mouth daily.     gabapentin (NEURONTIN) 300 MG capsule Take 1 capsule (300 mg total) by mouth 2 (two) times daily. (Patient taking differently: Take 300 mg by mouth daily.) 60 capsule 5   Multiple Vitamin (MULTIVITAMIN) tablet Take 1 tablet by mouth daily.     mycophenolate (CELLCEPT) 500 MG tablet Take 1 tablet in the morning and 2 tablet at bedtime. 270 tablet 1   rosuvastatin (CRESTOR) 20 MG tablet TAKE 1 TABLET BY MOUTH EVERYDAY AT BEDTIME 90 tablet 0   albuterol (VENTOLIN HFA) 108 (90 Base) MCG/ACT inhaler Inhale 2 puffs into the lungs every 6 (six) hours as needed for wheezing or shortness of breath. 8 g 0   No current facility-administered medications on file prior to visit.  Review of Systems As in subjective    Objective:   Physical Exam Due to coronavirus pandemic stay at home measures, patient visit was virtual and they were not examined in person.   Wt 260 lb (117.9 kg)   BMI 32.50 kg/m   Quite congested sounding, otherwise answers questions appropriately.  Not particularly ill-appearing    Assessment:     Encounter Diagnoses  Name Primary?   Nasal congestion Yes   Allergic rhinitis, unspecified seasonality, unspecified trigger    Nosebleed        Plan:     We discussed his symptoms and concerns of; nasal congestion.  He is also been doing Afrin for about a week  I advised the stop Afrin.  Hydrate well.  Add  allergy pill at night such as Zyrtec or Allegra or Benadryl.  Continue Sudafed another 3 to 5 days  Begin trial of either Qnasl nasal spray or ipratropium which ever is covered by insurance as below.  Use this in place of Afrin and Rhinocort to see if it works better  Discussed proper use of medication.  Continue gentle nasal saline flush  If not seeing improvement in the next 3 to 4 days call back.  We may need to get you back into ear nose and throat specialist if not improving or if worsening nosebleeds.  He has a history of chronic nosebleed problems but had not been having nosebleeds for a while until this aggravating symptom currently  Ah was seen today for bad congestion.  Diagnoses and all orders for this visit:  Nasal congestion  Allergic rhinitis, unspecified seasonality, unspecified trigger  Nosebleed  Other orders -     Beclomethasone Dipropionate 80 MCG/ACT AERS; Place 1 spray into the nose 2 (two) times daily. -     ipratropium (ATROVENT) 0.06 % nasal spray; Place 2 sprays into both nostrils 4 (four) times daily.  F/u prn

## 2023-04-12 ENCOUNTER — Telehealth: Payer: Self-pay | Admitting: Neurology

## 2023-04-12 NOTE — Telephone Encounter (Signed)
Steward Drone with [employer] called on 04/09/23 and left a message with the access nurse. She would like to speak with someone regarding an expedited appeal. [employer] approved for 4 months.

## 2023-04-13 NOTE — Telephone Encounter (Signed)
Devin Erickson and she stated that she has everything she needs and patient is approved for 4 months. Nothing more needed at this point.

## 2023-04-20 ENCOUNTER — Other Ambulatory Visit: Payer: Self-pay

## 2023-04-20 MED ORDER — MYCOPHENOLATE MOFETIL 500 MG PO TABS: ORAL_TABLET | ORAL | 1 refills | Status: DC

## 2023-05-04 ENCOUNTER — Encounter: Payer: Self-pay | Admitting: Neurology

## 2023-05-04 ENCOUNTER — Ambulatory Visit: Payer: Managed Care, Other (non HMO) | Admitting: Neurology

## 2023-05-04 VITALS — BP 141/100 | HR 57 | Ht 75.0 in | Wt 277.0 lb

## 2023-05-04 DIAGNOSIS — R29898 Other symptoms and signs involving the musculoskeletal system: Secondary | ICD-10-CM | POA: Diagnosis not present

## 2023-05-04 DIAGNOSIS — G6181 Chronic inflammatory demyelinating polyneuritis: Secondary | ICD-10-CM

## 2023-05-04 DIAGNOSIS — M79601 Pain in right arm: Secondary | ICD-10-CM | POA: Diagnosis not present

## 2023-05-04 MED ORDER — TIZANIDINE HCL 4 MG PO TABS: 4.0000 mg | ORAL_TABLET | Freq: Two times a day (BID) | ORAL | 1 refills | Status: DC | PRN

## 2023-05-04 NOTE — Progress Notes (Signed)
 Follow-up Visit   Date: 05/04/2023    Devin Erickson MRN: [id number] DOB: 06/04/1970    Devin Erickson is a 53 y.o. right-handed African American male with hyperlipidemia and anxiety returning to the clinic for follow-up of polyradiculoneuropathy.  The patient was accompanied to the clinic by self.   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 He was unable to do IVIG due to time constraints and opted to start Cellcept . Late in 2024, he began having increased weakness of the hand and after discussing option, he agreed to start IVIG. - Continue Cellcept  to 1500mg /d - He will be starting IVIG on 1/28 as home infusion  - Self tapered off gabapentin    2.  Right scapula pain, suggestive of musculoskeletal pain.  He has tenderness over the scapula border. - Start tizanidine  4mg  BID prn - If no improvement, will refer to Sports Medicine  3.  Cervical spondylosis with mild multilevel foraminal stenosis at C5-6 and C6-7 which would not cause the severity of his hand weakness.    4.  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  Over the summer, he noticed weakness in grip on the right hand which continued to progress to the point where he is unable to use his hand to crank his ignition, turning keys to entire his home, grasping objects, and using utensils.  He is dropping things frequently.  He has difficulty straightening the hand.  He denies weakness with raising the arm or bending at the  elbow.  He has mild pain in the right shoulder, described as a nagging pain.  He treated it with tylenol .  Occasionally, he has numbness over the tips of the fingers.     He denies similar symptoms in the left hand.  No numbness/tingling in the feet.  He denies any ongoing pain or neck pain.  MRI cervical spine was performed in July which shows cervical spondylosis and multilevel foraminal stenosis at right C3-C4, bilateral C4-5 and left C6-7. Additionally, there was note of edema signal in the C6 and C7 spinous process as well as interspinous ligaments at C5-6, C6-7, C7-T1, and T1-T2.     NCS/EMG performed in September shows diffuse prolonged latency, slowed motor conduction velocity, and reduced amplitude involving the median and ulnar nerves.  Needle EMG shows active fibrillation potentials in the FDI, ABP, EDC, and biceps muscles.  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 Labs indicate positive ANA.   UPDATE 03/25/2022:  He is here to discuss results of EMG which favors a polyradiculoneuropathy affecting both arms, worse on the right.  He reports having discomfort in the left shoulder, similar to what he experienced in the beginning on the right.  No numbness/tinging of the left arm.  His right hand remains unchanged and continues to have weakness.  He has difficulty with fine motor tasks.   UPDATE 06/01/2022:  He is here for follow-up visit and discuss LP results.  There has been no significant change with his  hand weakness or tingling, which remains worse in the right hand.  No similar symptoms in the legs or imbalance.  LP shows elevated protein of 116, normal white count and presence of oligoclonal bands.  IgG is normal.   UPDATE 09/15/2022:  He is here for follow-up visit.  Since his last visit, he continued to have additional monthly solumedrol and  reports no significant change.  He is having mild tingling in the left hand and has noticed some weakness.   He was evaluated by Dr. Dolphus in Rheumatology last week whose evaluation led to the diagnosis of limited systemic sclerosis.    UPDATE 12/29/2022:  He is here for follow-up.  He has not noticed any new weakness of the hand and feels that symptoms stabilized with slight improvement.  He is tolerating Cellcept  500mg  BID.  No new weakness, numbness/tingling.  He will be seeing rheumatology next week.  He has noticed that it takes longer for him to get over a viral illness now.  He has a URI several weeks ago and only now starting to feel better.   He reports having right shoulder stiffness which started a few weeks ago.  More recently, he began noticing progressive weakness in the right hand.  He is unable to hold items in the right hand, such as a fork.  He has had a few spells of right leg buckling.  No new tingling or numbness.   UPDATE 05/04/2023:  He is having more right shoulder pain, which is described as achy/pressure over the scapula.  Sleeping on the right side makes it worse.  He has tried tylenol  which provides some benefit.  No improvement with flexeril  which his PCP prescribed.  He has not started IVIG as it was recently approved, but is scheduled to have this on 1/28.  He feels that right hand is overall stable, weakness is worse in the evening.    Medications:  Current Outpatient Medications on File Prior to Visit  Medication Sig Dispense Refill   albuterol  (VENTOLIN  HFA) 108 (90 Base) MCG/ACT inhaler Inhale 2 puffs into the lungs every 6 (six) hours as needed for wheezing or shortness of breath. 8 g 1   albuterol  (VENTOLIN  HFA) 108 (90 Base) MCG/ACT inhaler Inhale 2 puffs into the lungs every 6 (six) hours as needed for wheezing or shortness of breath. 8 g 0   Beclomethasone Dipropionate  80 MCG/ACT AERS Place 1 spray into the nose 2 (two) times daily. 8.7 g 1   cetirizine (ZYRTEC)  10 MG tablet Take 10 mg by mouth daily.     ipratropium (ATROVENT ) 0.06 % nasal spray Place 2 sprays into both nostrils 4 (four) times daily. 15 mL 0   Multiple Vitamin (MULTIVITAMIN) tablet Take 1 tablet by mouth daily.     mycophenolate  (CELLCEPT ) 500 MG tablet Take 1 tablet in the morning and 2 tablet at bedtime. 270 tablet 1   rosuvastatin  (CRESTOR ) 20 MG tablet TAKE 1 TABLET BY MOUTH EVERYDAY AT BEDTIME 90 tablet 0   gabapentin  (NEURONTIN ) 300 MG capsule Take 1 capsule (300 mg total) by mouth 2 (two) times daily. (Patient not taking: Reported on 05/04/2023) 60 capsule 5   No current facility-administered medications on file prior to visit.    Allergies: No Known Allergies  Vital Signs:  BP (!) 141/100   Pulse (!) 57   Ht 6' 3 (1.905 m)   Wt 277 lb (125.6 kg)   SpO2 98%   BMI 34.62 kg/m   Neurological  Exam: MENTAL STATUS including orientation to time, place, person, recent and remote memory, attention span and concentration, language, and fund of knowledge is normal.  Speech is not dysarthric.  CRANIAL NERVES:   Normal conjugate, extra-ocular eye movements in all directions of gaze.  No ptosis.  Face is symmetric. Palate elevates symmetrically.  Tongue is midline.  MOTOR:  Right FDI, ADM, ABP, and medial forearm atrophy. He has muscle tenderness over the right scapula.   No fasciculations or abnormal movements.  No pronator drift.    Upper Extremity:  Right   Left  Deltoid  5/5    5/5   Biceps  5/5    5/5   Triceps  5/5    5/5   Wrist extensors  5/5    5/5   Wrist flexors  4/5    5/5   Finger extensors  2+/5    5/5   Finger flexors  3+/5    5/5   Dorsal interossei  1/5    5-/5   Abductor pollicis  3/5    5/5   Tone (Ashworth scale)  0   0    Lower Extremity:  Right   Left  Hip flexors  5/5    5/5   Knee flexors  5/5    5/5   Dorsiflexors  5/5    5/5   Plantarflexors  5/5    5/5   Toe extensors  5/5    5/5   Toe flexors  5/5    5/5   Tone (Ashworth scale)  0   0     MSRs:                                              Right        Left brachioradialis tr   1+  biceps tr   1+  triceps tr   1+  patellar 1+   1+  ankle jerk 1+   1+  Hoffman no   no  plantar response down   down    SENSORY:  Reduced temperature in the right palm, compared to the left.  Sensation intact in the legs.     COORDINATION/GAIT: Normal finger-to- nose-finger.  Finger tapping slowed on the right due to weakness.   Gait narrow based and stable.   Data: Labs 02/18/2022:  vitamin B12 880, folate > 23, CRP <1.0, ESR 16, TSH 1.65, ANCA neg, ACE 33, copper  110, SPEP with IFE no M protein, cryo neg, vitamin B1 7*, ANA positive, centromere antibody 1:640*  NCS/EMG of bilateral arms 03/24/2022 performed at [facility]: This is a complex study.  Findings are most suggestive of a subacute demyelinating and axonal polyradiculoneuropathy affecting the upper extremities, which is worse on the right.    NCS/EMG of the right arm 01/08/2022 performed at [facility]: EMG & NCV Findings:  Impression: The above electrodiagnostic study is ABNORMAL but is difficult to fully interpret.  The needle EMG shows significant denervation but active motor unit potentials in multiple muscles which could be consistent with cervical stenosis but the MRI does not show any cervical stenosis.  This could be a brachial plexus type lesion although it does not seem to fit to a specific trauma although it is closest to an upper trunk brachial plexus lesion.  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    However the  patient seems to have a concomitant median neuropathy at the wrist but also slowing of the ulnar nerve distal to the elbow and this would represent more of a demyelination type lesion on top of what was seen with the denervation potentials.     MRI cervical spine wo contrast 11/03/21: Intermittently motion degraded exam.  Indeterminate 18 mm T2 STIR hyperintense and T1 hypointense lesion within the  T1 vertebral body. While this may reflect an atypical hemangioma, alternative etiologies (including osseous metastatic disease) cannot be excluded. Short-interval 2-3 month MRI follow-up without and with contrast is recommended to monitor this finding.   Marrow edema within the C6 and C7 spinous processes. Edema signal also present within the interspinous ligaments at C5-C6, C6-C7, C7-T1 and T1-T2. Findings are nonspecific, but possibly degenerative or posttraumatic in etiology. If there has been recent trauma, the interspinous ligament edema could reflect interspinous ligament injury and a cervical spine CT should be considered to exclude C6/C7 spinous process fractures. Additionally, attention to these sites recommended at MRI imaging follow-up.   Cervical spondylosis, as outlined. No more than mild relative spinal canal narrowing. Multilevel foraminal stenosis, as detailed and greatest on the right at C3-C4 (moderate), bilaterally at C4-C5 (mild-to-moderate) and on the left at C6-C7 (mild-to-moderate).   Nonspecific straightening of the expected cervical lordosis.  CSF 05/26/2022:  R0 W1 P116*  G56   ACE 5, IgG index 0.54, MBP pending, OCB present*, cytology negative    Thank you for allowing me to participate in patient's care.  If I can answer any additional questions, I would be pleased to do so.    Sincerely,    Valina Maes K. Tobie, DO /

## 2023-05-04 NOTE — Patient Instructions (Signed)
 Start tizanidine  4mg  at bedtime.  If it does not make you too sleepy, you may also take this during the daytime.  If your right shoulder pain does not improve in the next 1-2 weeks, please let me know and we can make a referral for you to see Sports Medicine.

## 2023-05-05 NOTE — Progress Notes (Signed)
Office Visit Note  Patient: Devin Erickson             Date of Birth: 1971-04-17           MRN: [id number]             PCP: Jac Canavan, PA-C Referring: Jac Canavan, PA-C Visit Date: 05/19/2023 Occupation: @GUAROCC @  Subjective:  Right sided neck and arm pain  History of Present Illness: Devin Erickson is a 53 y.o. male with limited systemic sclerosis, Raynaud's and chronic inflammatory demyelinating polyneuropathy.  He returns today after her initial visit on Sep 11, 2022.  He had been under care of Dr. Allena Katz.  He decided not to go on IVIG and was started on CellCept last year.  He was evaluated by Dr. Allena Katz on May 04, 2023 with progressive weakness in his hands.  Dr. Allena Katz plans to start him on IVIG on January 28.  Patient self tapered gabapentin.  She also prescribed tizanidine for musculoskeletal pain. Patient was evaluated at [facility] rheumatology in July 2024.  The diagnosis of limited systemic sclerosis was confirmed.  There was also question of Sjogren's syndrome based on the he had heterogeneous appearance of parotid glands.  Dr. Sherryll Burger recommended evaluation by neuro immunology specialist at [facility].  Patient states he missed his appointment at [facility].  He did not recommend immunosuppression for scleroderma or Sjogren's.  He agreed with CellCept for CIDP. He denies any increased skin tightness.  Denies any increased shortness of breath or palpitations.  He is concerned about the weakness in his right arm.  Has been also having increased neck pain radiating into his right arm.  He would want to be evaluated by a spine specialist.  He continues to have some Raynauds symptoms.  He has not noticed any joint swelling.    Activities of Daily Living:  Patient reports morning stiffness for 24 hours.   Patient Reports nocturnal pain.  Difficulty dressing/grooming: Reports Difficulty climbing stairs: Denies Difficulty getting out of chair: Denies Difficulty using hands for  taps, buttons, cutlery, and/or writing: Reports  Review of Systems  Constitutional:  Positive for fatigue.  HENT:  Negative for mouth sores and mouth dryness.   Eyes:  Negative for dryness.  Respiratory:  Negative for shortness of breath.   Cardiovascular:  Negative for chest pain and palpitations.  Gastrointestinal:  Negative for blood in stool, constipation and diarrhea.  Endocrine: Negative for increased urination.  Genitourinary:  Negative for involuntary urination.  Musculoskeletal:  Positive for joint pain, joint pain, myalgias, muscle weakness, morning stiffness, muscle tenderness and myalgias. Negative for gait problem and joint swelling.  Skin:  Positive for color change. Negative for rash, hair loss and sensitivity to sunlight.  Allergic/Immunologic: Positive for susceptible to infections.  Neurological:  Negative for dizziness and headaches.  Hematological:  Negative for swollen glands.  Psychiatric/Behavioral:  Positive for sleep disturbance. Negative for depressed mood. The patient is nervous/anxious.     PMFS History:  Patient Active Problem List   Diagnosis Date Noted   Nosebleed 04/06/2023   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   Neuropathy, arm, right 01/20/2022   Radiculopathy, cervical region 10/15/2021   Left arm pain 06/18/2021   Upper back pain 06/18/2021   Decreased ROM of neck 06/18/2021   Screening for heart disease 02/05/2021   Screening for prostate cancer 02/05/2021   Sleep disturbance 02/05/2021   Chronic right-sided thoracic back pain 05/08/2020   Fatigue 07/10/2019   Raynaud's phenomenon without  gangrene 07/10/2019   Erectile dysfunction 07/10/2019   At high risk for bleeding 07/10/2019   ANA positive 07/10/2019   Elevated liver function tests 03/08/2019   Need for influenza vaccination 02/16/2019   Vaccine counseling 02/16/2019   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   Chronic left shoulder pain 05/02/2018    Benign paroxysmal positional vertigo due to bilateral vestibular disorder 06/24/2017   Encounter for health maintenance examination in adult 07/22/2016   Chronic pain of right knee 07/22/2016   Atopic dermatitis 12/12/2015   Tension headache, chronic 10/04/2015   Cough variant asthma vs UACS 09/04/2015   Epistaxis 08/01/2014   Rhinitis, allergic 08/01/2014   Hyperlipidemia 08/01/2014   OSA (obstructive sleep apnea) 08/01/2014    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   History of sleep apnea 04/2012   per overnight screen/oximetry    Obesity    Thalassemia minor    Wears glasses    Wears glasses     Family History  Problem Relation Age of Onset   Cancer Mother 62       colon   Hypertension Mother    Dementia Father    Cancer Father        pancreas   Healthy Brother    Healthy Son    Heart disease Neg Hx    Lung disease Neg Hx    Past Surgical History:  Procedure Laterality Date   COLONOSCOPY  04/27/2010   [facility], normal per pt   TONSILLECTOMY     TONSILLECTOMY AND ADENOIDECTOMY     age 31yo   Social History   Social History Narrative   Married, son, [name] - Designer, television/film set, Set designer, exercise - going gym 4 days per week.  Jehoviah Witness.  01/2021         Right Handed    Lives in a one story home    Occasionally caffeine   Immunization History  Administered Date(s) Administered   Influenza Split 02/07/2009, 03/04/2010, 02/15/2012   Influenza, Mdck, Trivalent,PF 6+ MOS(egg free) 01/12/2023   Influenza,inj,Quad PF,6+ Mos 02/15/2012, 03/10/2013, 06/14/2014, 02/01/2015, 02/08/2017, 03/10/2018, 02/16/2019, 04/05/2020, 01/06/2021, 01/29/2022   Influenza-Unspecified 05/15/2016   Moderna Sars-Covid-2 Vaccination 07/13/2019, 08/10/2019   PFIZER Comirnaty(Gray Top)Covid-19  Tri-Sucrose Vaccine 08/23/2020   PFIZER(Purple Top)SARS-COV-2 Vaccination 03/02/2020   Pfizer Covid-19 Vaccine Bivalent Booster 46yrs & up 01/06/2021   Pfizer(Comirnaty)Fall Seasonal Vaccine 12 years and older 01/12/2023   Tdap 02/15/2012, 08/10/2012   Unspecified SARS-COV-2 Vaccination 01/29/2022   Zoster Recombinant(Shingrix) 06/08/2020, 08/07/2020     Objective: Vital Signs: BP (!) 151/85 (BP Location: Left Arm, Patient Position: Sitting, Cuff Size: Large)   Pulse 89   Resp 14   Ht 6\' 3"  (1.905 m)   Wt 275 lb (124.7 kg)   BMI 34.37 kg/m    Physical Exam Vitals and nursing note reviewed.  Constitutional:      Appearance: He is well-developed.  HENT:     Head: Normocephalic and atraumatic.  Eyes:     Conjunctiva/sclera: Conjunctivae normal.     Pupils: Pupils are equal, round, and reactive to light.  Cardiovascular:     Rate and Rhythm: Normal rate and regular rhythm.     Heart sounds: Normal heart sounds.  Pulmonary:     Effort: Pulmonary effort  is normal.     Breath sounds: Normal breath sounds.  Abdominal:     General: Bowel sounds are normal.     Palpations: Abdomen is soft.  Musculoskeletal:     Cervical back: Normal range of motion and neck supple.  Skin:    General: Skin is warm and dry.     Capillary Refill: Capillary refill takes less than 2 seconds.     Comments: Mild sclerodactyly was noted to her bilateral hands.  Telengectesia's were noted on the palmar aspect of his hands.  Neurological:     Mental Status: He is alert and oriented to person, place, and time.  Psychiatric:        Behavior: Behavior normal.      Musculoskeletal Exam: He had limited lateral rotation to the right side with discomfort.  Right shoulder and abduction was about 140 degrees, forward flexion about 140 degrees and some limitation with internal rotation.  Right shoulder joint range of motion was painful.  Left shoulder tenderness and full range of motion.  Elbow joints, wrist  joints, MCPs PIPs and DIPs with good range of motion except he has contractures in his right hand PIPs and limited extension of his right wrist.  Hip joints and knee joints in good range of motion.  There is no tenderness over ankles or MTPs.  CDAI Exam: CDAI Score: -- Patient Global: --; Provider Global: -- Swollen: --; Tender: -- Joint Exam 05/19/2023   No joint exam has been documented for this visit   There is currently no information documented on the homunculus. Go to the Rheumatology activity and complete the homunculus joint exam.  Investigation: No additional findings.  Imaging: No results found.  Recent Labs: Lab Results  Component Value Date   WBC 7.8 12/29/2022   HGB 11.4 (L) 12/29/2022   PLT 229.0 12/29/2022   NA 137 12/29/2022   K 3.8 12/29/2022   CL 104 12/29/2022   CO2 27 12/29/2022   GLUCOSE 93 12/29/2022   BUN 16 12/29/2022   CREATININE 1.21 12/29/2022   BILITOT 0.7 12/29/2022   ALKPHOS 45 12/29/2022   AST 28 12/29/2022   ALT 21 12/29/2022   PROT 6.8 12/29/2022   ALBUMIN 4.1 12/29/2022   CALCIUM 9.3 12/29/2022   GFRAA 84 02/16/2019   November 18, 2021 RPR negative, HIV negative, hepatitis C nonreactive, ESR 23 February 18, 2022 ANA 1: 640 centromere, ENA (double-stranded DNA, RNP, Smith, SCL 70, SSA, SSB) negative, ESR 16, cryoglobulins negative, CRP normal,  ANCA negative May 26, 2022 ACE 5 Sep 11 2022 myositis panel negative, HMG CR antibody negative, ANA> 1: 1280 centromere, 1: 80 cytoplasmic SCL 70 negative, dsDNA negative, RNP negative, Smith negative, CK227, aldolase 5.2, C3-C4 normal, urine protein creatinine ratio normal  November 03, 2022 ANA 1: 2560 centromere, anticentromere antibody positive, (antichromatin, RNP, SSA, SSB, Smith, SCL 70, Jo 1 negative), antimitochondrial negative, TSH normal, [id number]  Speciality Comments: No specialty comments available.  Procedures:  No procedures performed Allergies: Patient has no known allergies.    Assessment / Plan:     Visit Diagnoses: Limited systemic sclerosis (HCC) - Sclerodactyly, Raynaud's, telangiectasia, nailbed capillary changes, ANA 1: 640 centromere: High-resolution CT was negative for ILD.  I will refer him to ILD clinic at pulmonology to establish he was also evaluated by cardiology.  The CT scan of the chest showed three-vessel coronary atherosclerosis, aortic atherosclerosis and dilated ascending thoracic aorta measuring 42 mm.  Follow-up with the cardiology on an annual basis.  He went to [facility] rheumatology for a second opinion.  He was evaluated by Dr. Sherryll Burger who was in agreement with current plan.  He also did not recommend any aggressive therapy for scleroderma.  He wanted patient to be seen by neuro immunology but patient missed the appointment.  Raynaud's phenomenon without gangrene - Mild symptoms.  Keep record of all was discussed.  Patient is followed by Dr. Allena Katz.  Elevated CK - Initial CK was elevated at 1719 which responded to prednisone prescribed by Dr. Allena Katz.  12/12/2022 CK2 27, myositis panel negative, HMG CR antibody negative.  Sjogren's syndrome with other organ involvement (HCC) - Heterogeneous appearance of parotid glands was noted on the MRI of the cervical spine.  History of dry mouth and dry eyes.  November 03, 2022 ANA was 1: 2560 centromere pattern, SSA and SSB antibodies were negative.  DDD (degenerative disc disease), cervical - mild multilevel foraminal stenosis at C5-6 and C6-7.  He has been having severe neck discomfort with radiculopathy.  He states he has nocturnal pain.  I refer him to Dr. Alvester Morin for an evaluation and injection.  Avoidance of steroid use with scleroderma was advised.  Increased risk of hypertensive crisis with prednisone use was also advised.  CIDP (chronic inflammatory demyelinating polyneuropathy) (HCC) - Treated with IV Solu-Medrol by Dr. Allena Katz in the past.  He has been on CellCept 500 mg 3 tablets a day now.  He will be starting IVIG  soon by Dr. Allena Katz.  High risk medication use - He is currently on CellCept 500 mg, p.o. 1 tablet morning, 2 tablets p.m. he will be starting IVIG soon prescribed by Dr. Allena Katz.  Labs are monitored by Dr. Allena Katz.  Alopecia totalis - Diagnosed 8 years ago.  Other atopic dermatitis  Mixed hyperlipidemia  Allergic rhinitis, unspecified seasonality, unspecified trigger  Cough variant asthma vs UACS  Orders: Orders Placed This Encounter  Procedures   Ambulatory referral to Physical Medicine Rehab   Ambulatory referral to Pulmonology   No orders of the defined types were placed in this encounter.   Follow-Up Instructions: Return in about 6 months (around 11/16/2023) for CIDP, Scleroderma.   Pollyann Savoy, MD  Note - This record has been created using Animal nutritionist.  Chart creation errors have been sought, but may not always  have been located. Such creation errors do not reflect on  the standard of medical care.

## 2023-05-13 ENCOUNTER — Encounter: Payer: Self-pay | Admitting: Podiatry

## 2023-05-13 ENCOUNTER — Ambulatory Visit: Payer: Managed Care, Other (non HMO) | Admitting: Podiatry

## 2023-05-13 DIAGNOSIS — M7751 Other enthesopathy of right foot: Secondary | ICD-10-CM | POA: Diagnosis not present

## 2023-05-13 MED ORDER — TRIAMCINOLONE ACETONIDE 10 MG/ML IJ SUSP
10.0000 mg | Freq: Once | INTRAMUSCULAR | Status: AC
  Administered 2023-05-13: 10 mg via INTRA_ARTICULAR

## 2023-05-17 NOTE — Progress Notes (Signed)
Subjective:   Patient ID: Devin Erickson, male   DOB: 53 y.o.   MRN: [id number]   HPI Patient presents having pain again underneath the right outside of the foot   ROS      Objective:  Physical Exam  Neurovascular status intact inflammation pain fifth MPJ right fluid buildup around the joint surface     Assessment:  Inflammatory capsulitis fifth MPJ right     Plan:  H&P reviewed sterile prep injected the plantar capsule and lateral side 3 mg Dexasone Kenalog 5 mg Xylocaine applied sterile dressing reappoint as symptoms indicate

## 2023-05-19 ENCOUNTER — Encounter: Payer: Self-pay | Admitting: Rheumatology

## 2023-05-19 ENCOUNTER — Ambulatory Visit: Payer: Managed Care, Other (non HMO) | Attending: Rheumatology | Admitting: Rheumatology

## 2023-05-19 VITALS — BP 151/85 | HR 89 | Resp 14 | Ht 75.0 in | Wt 275.0 lb

## 2023-05-19 DIAGNOSIS — M3509 Sicca syndrome with other organ involvement: Secondary | ICD-10-CM | POA: Diagnosis not present

## 2023-05-19 DIAGNOSIS — Z79899 Other long term (current) drug therapy: Secondary | ICD-10-CM

## 2023-05-19 DIAGNOSIS — M3500 Sicca syndrome, unspecified: Secondary | ICD-10-CM | POA: Insufficient documentation

## 2023-05-19 DIAGNOSIS — I7300 Raynaud's syndrome without gangrene: Secondary | ICD-10-CM

## 2023-05-19 DIAGNOSIS — G6181 Chronic inflammatory demyelinating polyneuritis: Secondary | ICD-10-CM

## 2023-05-19 DIAGNOSIS — M349 Systemic sclerosis, unspecified: Secondary | ICD-10-CM | POA: Insufficient documentation

## 2023-05-19 DIAGNOSIS — M5030 Other cervical disc degeneration, unspecified cervical region: Secondary | ICD-10-CM | POA: Insufficient documentation

## 2023-05-19 DIAGNOSIS — L2089 Other atopic dermatitis: Secondary | ICD-10-CM

## 2023-05-19 DIAGNOSIS — J45991 Cough variant asthma: Secondary | ICD-10-CM

## 2023-05-19 DIAGNOSIS — L630 Alopecia (capitis) totalis: Secondary | ICD-10-CM

## 2023-05-19 DIAGNOSIS — R748 Abnormal levels of other serum enzymes: Secondary | ICD-10-CM

## 2023-05-19 DIAGNOSIS — J309 Allergic rhinitis, unspecified: Secondary | ICD-10-CM

## 2023-05-19 DIAGNOSIS — E782 Mixed hyperlipidemia: Secondary | ICD-10-CM

## 2023-05-19 NOTE — Patient Instructions (Signed)
 Cervical Strain and Sprain Rehab Ask your health care provider which exercises are safe for you. Do exercises exactly as told by your health care provider and adjust them as directed. It is normal to feel mild stretching, pulling, tightness, or discomfort as you do these exercises. Stop right away if you feel sudden pain or your pain gets worse. Do not begin these exercises until told by your health care provider. Stretching and range-of-motion exercises Cervical side bending  Using good posture, sit on a stable chair or stand up. Without moving your shoulders, slowly tilt your left / right ear to your shoulder until you feel a stretch in the neck muscles on the opposite side. You should be looking straight ahead. Hold for __________ seconds. Repeat with the other side of your neck. Repeat __________ times. Complete this exercise __________ times a day. Cervical rotation  Using good posture, sit on a stable chair or stand up. Slowly turn your head to the side as if you are looking over your left / right shoulder. Keep your eyes level with the ground. Stop when you feel a stretch along the side and the back of your neck. Hold for __________ seconds. Repeat this by turning to your other side. Repeat __________ times. Complete this exercise __________ times a day. Thoracic extension and pectoral stretch  Roll a towel or a small blanket so it is about 4 inches (10 cm) in diameter. Lie down on your back on a firm surface. Put the towel in the middle of your back across your spine. It should not be under your shoulder blades. Put your hands behind your head and let your elbows fall out to your sides. Hold for __________ seconds. Repeat __________ times. Complete this exercise __________ times a day. Strengthening exercises Upper cervical flexion  Lie on your back with a thin pillow behind your head or a small, rolled-up towel under your neck. Gently tuck your chin toward your chest and nod  your head down to look toward your feet. Do not lift your head off the pillow. Hold for __________ seconds. Release the tension slowly. Relax your neck muscles completely before you repeat this exercise. Repeat __________ times. Complete this exercise __________ times a day. Cervical extension  Stand about 6 inches (15 cm) away from a wall, with your back facing the wall. Place a soft object, about 6-8 inches (15-20 cm) in diameter, between the back of your head and the wall. A soft object could be a small pillow, a ball, or a folded towel. Gently tilt your head back and press into the soft object. Keep your jaw and forehead relaxed. Hold for __________ seconds. Release the tension slowly. Relax your neck muscles completely before you repeat this exercise. Repeat __________ times. Complete this exercise __________ times a day. Posture and body mechanics Body mechanics refer to the movements and positions of your body while you do your daily activities. Posture is part of body mechanics. Good posture and healthy body mechanics can help to relieve stress in your body's tissues and joints. Good posture means that your spine is in its natural S-curve position (your spine is neutral), your shoulders are pulled back slightly, and your head is not tipped forward. The following are general guidelines for using improved posture and body mechanics in your everyday activities. Sitting  When sitting, keep your spine neutral and keep your feet flat on the floor. Use a footrest, if needed, and keep your thighs parallel to the floor. Avoid rounding  your shoulders. Avoid tilting your head forward. When working at a desk or a computer, keep your desk at a height where your hands are slightly lower than your elbows. Slide your chair under your desk so you are close enough to maintain good posture. When working at a computer, place your monitor at a height where you are looking straight ahead and you do not have to  tilt your head forward or downward to look at the screen. Standing  When standing, keep your spine neutral and keep your feet about hip-width apart. Keep a slight bend in your knees. Your ears, shoulders, and hips should line up. When you do a task in which you stand in one place for a long time, place one foot up on a stable object that is 2-4 inches (5-10 cm) high, such as a footstool. This helps keep your spine neutral. Resting When lying down and resting, avoid positions that are most painful for you. Try to support your neck in a neutral position. You can use a contour pillow or a small rolled-up towel. Your pillow should support your neck but not push on it. This information is not intended to replace advice given to you by your health care provider. Make sure you discuss any questions you have with your health care provider. Document Revised: 08/17/2022 Document Reviewed: 11/03/2021 Elsevier Patient Education  2024 [employer].

## 2023-06-01 ENCOUNTER — Encounter: Payer: 59 | Admitting: Medical

## 2023-06-04 ENCOUNTER — Telehealth: Payer: Self-pay | Admitting: Neurology

## 2023-06-04 NOTE — Telephone Encounter (Signed)
 Devin Erickson- Dose, Frequency and expected duration IVIG, call bk [phone]

## 2023-06-07 NOTE — Telephone Encounter (Signed)
 Called Devin Erickson and left a message that [facility] is handling patients IVIG. Left contact information if she needed to contact our office.

## 2023-06-07 NOTE — Telephone Encounter (Signed)
 Devin Erickson called back and was informed that patients Loading dose is 2g/kg and maintenance is 1g/kg every 28 days x 1 year.   Devin Erickson thanked us  for the call and had no further questions or concerns.

## 2023-06-09 ENCOUNTER — Ambulatory Visit (INDEPENDENT_AMBULATORY_CARE_PROVIDER_SITE_OTHER): Payer: Managed Care, Other (non HMO) | Admitting: Physical Medicine and Rehabilitation

## 2023-06-09 ENCOUNTER — Encounter: Payer: Self-pay | Admitting: Physical Medicine and Rehabilitation

## 2023-06-09 ENCOUNTER — Telehealth: Payer: Self-pay | Admitting: Neurology

## 2023-06-09 VITALS — BP 161/101 | HR 86

## 2023-06-09 DIAGNOSIS — M4802 Spinal stenosis, cervical region: Secondary | ICD-10-CM | POA: Diagnosis not present

## 2023-06-09 DIAGNOSIS — R531 Weakness: Secondary | ICD-10-CM

## 2023-06-09 DIAGNOSIS — M542 Cervicalgia: Secondary | ICD-10-CM

## 2023-06-09 DIAGNOSIS — R202 Paresthesia of skin: Secondary | ICD-10-CM | POA: Diagnosis not present

## 2023-06-09 NOTE — Progress Notes (Signed)
Pain Score--10  No Blood Thinners No Allergies to Contrast Dye Manual B/P checked 5 min after machine 160/91

## 2023-06-09 NOTE — Telephone Encounter (Signed)
[facility] called wanting to see if our office received the Plan of Treatment for this pt? They need the doctor's signature and to have it faxed back at [phone].

## 2023-06-09 NOTE — Progress Notes (Signed)
Devin Erickson - 53 y.o. male MRN [id number]  Date of birth: March 13, 1971  Office Visit Note: Visit Date: 06/09/2023 PCP: Jac Canavan, PA-C Referred by: Jac Canavan, PA-C  Subjective: Chief Complaint  Patient presents with   Neck - Pain   HPI: Devin Erickson is a 53 y.o. male who comes in today per the request of Dr. Pollyann Savoy for evaluation of chronic, worsening and severe right sided neck pain radiating to shoulder and down to elbow. Also reports weakness and tingling sensation to entire right arm. Pain ongoing for over 1 year. No specific aggravating factors, his pain is constant. He describes pain as dull and throbbing sensation, currently rates as 10 out of 10. Some relief of pain with home exercise regimen, rest and use of medications. History of formal physical therapy with minimal relief of pain. Cervical MRI imaging from August of 2024 shows multi level cervical spondylosis, there is right uncovertebral degenerative changes and moderate right foraminal stenosis at the level of C3-C4. No high grade spinal canal stenosis noted. No recent trauma or falls.   He was seen by Dr. Nita Sickle with [facility] in October of 2024. Bilateral upper extremity NCV with EMG from 2023 is most suggestive of a subacute demyelinating and axonal polyradiculoneuropathy affecting the upper extremities, which is worse on the right. 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 More recently he was seen by Dr. Corliss Skains with [facility] and was referred to [place] for possible cervical epidural steroid injection.         Review of Systems  Musculoskeletal:  Positive for joint pain, myalgias and neck pain.  Neurological:  Positive for tingling and weakness.  All other systems reviewed  and are negative.  Otherwise per HPI.  Assessment & Plan: Visit Diagnoses:    ICD-10-CM   1. Neck pain  M54.2 Ambulatory referral to Pain Clinic    2. Foraminal stenosis of cervical region  M48.02 Ambulatory referral to Pain Clinic    3. Paresthesia of skin  R20.2 Ambulatory referral to Pain Clinic    4. Weakness  R53.1 Ambulatory referral to Pain Clinic       Plan: Findings:  Chronic, worsening and severe right sided neck pain radiating to shoulder and down right arm to elbow. Paresthesias to right upper extremity. Patient continues to have severe pain despite good conservative therapies such as formal physical therapy, home exercise regimen, rest and use of medications. Patients clinical presentation and exam are complex, his symptoms do not directly correlate with recent cervical MRI findings. There is no significant neural impingement noted, no severe spinal canal stenosis noted. His symptoms do appear to be most consistent with demyelinating polyradiculoneuropathy. There is marked weakness of right upper extremity, muscle atrophy and contractures of fingers. At this point, we would not recommend proceeding with cervical epidural steroid injection as we do not feel his symptoms are related to structural issues within cervical spine. We would recommend he follow up with Dr. Allena Katz and Dr. Corliss Skains for further management. I did discuss referral to a more comprehensive pain management center and placed referral to [facility] in [place]. This practice deals with more complex cases and interventional pain management.     Meds & Orders: No orders of the defined types were placed in this encounter.   Orders Placed This Encounter  Procedures   Ambulatory referral to Pain Clinic    Follow-up: Return if symptoms worsen or fail to improve.   Procedures: No procedures performed      Clinical History: MRI CERVICAL SPINE WITHOUT CONTRAST  Alignment: Straightening of the  expected cervical lordosis. No significant spondylolisthesis.   Vertebrae: Vertebral body height is maintained. 18 mm T2 STIR hyperintense and T1 hypointense lesion within the T1 vertebral body. Nonspecific edema signal within the spinous processes at C6 and C7.   Cord: No signal abnormality identified within the cervical spinal cord.   Posterior Fossa, vertebral arteries, paraspinal tissues: No abnormality identified within included portions of the posterior fossa. Flow voids preserved within the imaged cervical vertebral arteries. Heterogeneous appearance of the visualized parotid glands with suggestion of numerous small cysts within the glands. No paraspinal mass or collection. Edema signal within the interspinous spaces at C5-C6, C6-C7, C7-T1 and T1-T2.   Multilevel disc degeneration, greatest at C4-C5 (moderate to moderately advanced), C5-C6 (moderate to moderately advanced) and C6-C7 (moderate).   C2-C3: No significant disc herniation or spinal canal stenosis. Uncovertebral hypertrophy on the right. Facet arthrosis. Mild right neural foraminal narrowing.   C3-C4: No significant disc herniation or spinal canal stenosis. Uncovertebral hypertrophy (greater on the right). Facet arthrosis. Bilateral neural foraminal narrowing (moderate right, mild left).   C4-C5: Posterior disc osteophyte complex with bilateral uncovertebral hypertrophy. Mild facet arthrosis. Mild effacement of the ventral thecal sac (without significant spinal cord mass effect). Mild-to-moderate bilateral neural foraminal narrowing.   C5-C6: Shallow disc bulge. Uncovertebral hypertrophy (greater on the left). Facet arthrosis. No significant spinal canal stenosis. Mild left neural foraminal narrowing.   C6-C7: Disc bulge. Uncovertebral per trapezii (greater on the left). No significant spinal canal stenosis. Mild-to-moderate left neural foraminal narrowing.   C7-T1: This level is imaged in the sagittal  plane only. No significant disc herniation or stenosis.   Impressions #2 and #3 will be called to the ordering clinician or representative by the Radiologist Assistant, and communication documented in the PACS or [employer].   IMPRESSION: Indeterminate 18 mm T2 STIR hyperintense and T1 hypointense lesion within the T1 vertebral body. While this may reflect an atypical hemangioma, alternative etiologies (including osseous metastatic disease) cannot be excluded. Short-interval 2-3 month MRI follow-up without and with contrast is recommended to monitor this finding.   Marrow edema within the C6 and C7 spinous processes. Edema signal also present within the interspinous ligaments at C5-C6, C6-C7, C7-T1 and T1-T2. Findings are nonspecific, but possibly degenerative or posttraumatic in etiology. If there has been recent trauma, the interspinous ligament edema could reflect interspinous ligament injury and a cervical spine CT should be considered to exclude C6/C7 spinous process fractures. Additionally, attention to these sites recommended at MRI imaging follow-up.   Cervical spondylosis, as outlined. No more than mild relative spinal canal narrowing. Multilevel foraminal stenosis, as detailed and greatest on the right at C3-C4 (moderate), bilaterally at C4-C5 (mild-to-moderate) and on the left at C6-C7 (mild-to-moderate).   Nonspecific straightening of the expected cervical lordosis.  On: 10/24/2021 08:09 --------------------------------------- ADDENDUM: Finding omitted from impression: Heterogeneous appearance of the visualized parotid glands with suggestion of numerous small cysts within the glands. These findings may be seen in the setting of Sjogren syndrome or HIV-associated salivary gland disease.   These results will be called to the ordering clinician or representative by the Radiologist Assistant, and communication documented in the PACS or [employer].  On:  11/03/2021 19:40   He reports that he has  never smoked. He has never been exposed to tobacco smoke. He has never used smokeless tobacco. No results for input(s): "HGBA1C", "LABURIC" in the last 8760 hours.  Objective:  VS:  HT:    WT:   BMI:     BP:(!) 161/101  HR:86bpm  TEMP: ( )  RESP:  Physical Exam Vitals and nursing note reviewed.  HENT:     Head: Normocephalic and atraumatic.     Right Ear: External ear normal.     Left Ear: External ear normal.     Nose: Nose normal.     Mouth/Throat:     Mouth: Mucous membranes are moist.  Eyes:     Extraocular Movements: Extraocular movements intact.  Cardiovascular:     Rate and Rhythm: Normal rate.     Pulses: Normal pulses.  Pulmonary:     Effort: Pulmonary effort is normal.  Abdominal:     General: Abdomen is flat. There is distension.  Musculoskeletal:        General: Tenderness present.     Cervical back: Tenderness present.     Comments: No discomfort noted with flexion, extension and side-to-side rotation. Patient has good strength in the left upper extremity including 5 out of 5 strength in wrist extension, long finger flexion and APB. Muscle atrophy noted to right forearm and hand. Contractures noted to right fingers. 2+ strength noted with right finger flexion and extension. Decreased grip strength on the right. Shoulder range of motion is full bilaterally without any sign of impingement. Sensation intact bilaterally. Negative Hoffman's sign. Negative Spurling's sign.     Skin:    General: Skin is warm and dry.     Capillary Refill: Capillary refill takes less than 2 seconds.  Neurological:     Mental Status: He is alert and oriented to person, place, and time.     Motor: Weakness present.  Psychiatric:        Mood and Affect: Mood normal.        Behavior: Behavior normal.     Ortho Exam  Imaging: No results found.  Past Medical/Family/Surgical/Social History: Medications & Allergies reviewed per EMR, new  medications updated. Patient Active Problem List   Diagnosis Date Noted   Limited systemic sclerosis (HCC) 05/19/2023   Sjogren's disease (HCC) 05/19/2023   DDD (degenerative disc disease), cervical 05/19/2023   Nosebleed 04/06/2023   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   Neuropathy, arm, right 01/20/2022   Radiculopathy, cervical region 10/15/2021   Left arm pain 06/18/2021   Upper back pain 06/18/2021   Decreased ROM of neck 06/18/2021   Screening for heart disease 02/05/2021   Screening for prostate cancer 02/05/2021   Sleep disturbance 02/05/2021   Chronic right-sided thoracic back pain 05/08/2020   Fatigue 07/10/2019   Raynaud's phenomenon without gangrene 07/10/2019   Erectile dysfunction 07/10/2019   At high risk for bleeding 07/10/2019   ANA positive 07/10/2019   Elevated liver function tests 03/08/2019   Need for influenza vaccination 02/16/2019   Vaccine counseling 02/16/2019   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   Chronic left shoulder pain 05/02/2018   Benign paroxysmal positional vertigo due to bilateral vestibular disorder 06/24/2017   Encounter for health maintenance examination in adult 07/22/2016   Chronic pain of right knee 07/22/2016   Atopic dermatitis 12/12/2015   Tension headache, chronic 10/04/2015   Cough variant asthma vs UACS 09/04/2015   Epistaxis 08/01/2014   Rhinitis, allergic 08/01/2014   Hyperlipidemia 08/01/2014   OSA (obstructive sleep  apnea) 08/01/2014   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   History of sleep apnea 04/2012   per overnight screen/oximetry    Obesity    Thalassemia minor    Wears glasses    Wears glasses    Family History  Problem Relation Age of Onset   Cancer Mother 49       colon   Hypertension Mother     Dementia Father    Cancer Father        pancreas   Healthy Brother    Healthy Son    Heart disease Neg Hx    Lung disease Neg Hx    Past Surgical History:  Procedure Laterality Date   COLONOSCOPY  04/27/2010   [facility], normal per pt   TONSILLECTOMY     TONSILLECTOMY AND ADENOIDECTOMY     age 32yo   Social History   Occupational History   Not on file  Tobacco Use   Smoking status: Never    Passive exposure: Never   Smokeless tobacco: Never  Vaping Use   Vaping status: Never Used  Substance and Sexual Activity   Alcohol use: No    Comment: rarely   Drug use: No   Sexual activity: Not on file

## 2023-06-10 ENCOUNTER — Telehealth: Payer: Self-pay | Admitting: Neurology

## 2023-06-10 MED ORDER — DULOXETINE HCL 30 MG PO CPEP: 30.0000 mg | ORAL_CAPSULE | Freq: Every day | ORAL | 3 refills | Status: DC

## 2023-06-10 NOTE — Telephone Encounter (Signed)
Recevied and faxed 06/09/23

## 2023-06-10 NOTE — Telephone Encounter (Signed)
Pt called in stating he went to Ortho yesterday and there should be some notes in the system. He stated the pain is not muscle pain it is nerve pain.

## 2023-06-10 NOTE — Telephone Encounter (Signed)
Mychart message sent to patient.

## 2023-06-10 NOTE — Telephone Encounter (Signed)
Notes reviewed.  I agree that it is neuropathy causing his right hand weakness.  He was just started on IVIG and will be getting monthly maintenance dose.  Will have to wait and see how he responds.  Follow-up in April as scheduled.

## 2023-06-18 ENCOUNTER — Telehealth: Payer: Self-pay | Admitting: Neurology

## 2023-06-18 MED ORDER — GABAPENTIN 300 MG PO CAPS: ORAL_CAPSULE | ORAL | 5 refills | Status: DC

## 2023-06-18 NOTE — Telephone Encounter (Signed)
Having diarrhea since starting and trouble resting. Please readvise.

## 2023-06-18 NOTE — Telephone Encounter (Signed)
Left message at 10:49 06/18/2023

## 2023-06-18 NOTE — Telephone Encounter (Signed)
 Rx sent

## 2023-06-18 NOTE — Telephone Encounter (Signed)
It may be too early to see pain improvement because it can take several weeks to take effect.  If he is tolerating the medication, we can increase the dose to duloxetine 30mg  twice daily.

## 2023-06-18 NOTE — Telephone Encounter (Signed)
Left message again for patient to call office to discuss and pharmacy of choice.

## 2023-06-18 NOTE — Telephone Encounter (Signed)
Pt wants to chat with mahina/patel about the Cymbalta. He states its not helping him

## 2023-06-18 NOTE — Telephone Encounter (Signed)
Recommend stopping duloxetine in that case.  I recommend that we try gabapentin again, but we may need to go higher on the dose than he did last summer.  If he would like to try this, then start gabapentin 300mg  at bedtime x 1 week, then increase to 300mg  twice daily x 1 week, then 300mg  three times daily thereafter.

## 2023-06-18 NOTE — Telephone Encounter (Signed)
Pt called back, he agreed to start gabapentin and he wants it sent to [facility] on [address]

## 2023-06-18 NOTE — Telephone Encounter (Signed)
Patient called in stating the cymbalta mg is not helping, looks like new start on. Please advise and thank you.

## 2023-06-25 ENCOUNTER — Other Ambulatory Visit: Payer: Self-pay | Admitting: Neurology

## 2023-06-30 ENCOUNTER — Ambulatory Visit (INDEPENDENT_AMBULATORY_CARE_PROVIDER_SITE_OTHER): Payer: 59 | Admitting: Medical

## 2023-06-30 ENCOUNTER — Encounter: Payer: Self-pay | Admitting: Medical

## 2023-06-30 VITALS — BP 144/90 | HR 92 | Ht 75.0 in | Wt 279.4 lb

## 2023-06-30 DIAGNOSIS — I7121 Aneurysm of the ascending aorta, without rupture: Secondary | ICD-10-CM | POA: Diagnosis not present

## 2023-06-30 DIAGNOSIS — I10 Essential (primary) hypertension: Secondary | ICD-10-CM

## 2023-06-30 DIAGNOSIS — R768 Other specified abnormal immunological findings in serum: Secondary | ICD-10-CM

## 2023-06-30 DIAGNOSIS — E785 Hyperlipidemia, unspecified: Secondary | ICD-10-CM

## 2023-06-30 DIAGNOSIS — R7689 Other specified abnormal immunological findings in serum: Secondary | ICD-10-CM

## 2023-06-30 DIAGNOSIS — G6181 Chronic inflammatory demyelinating polyneuritis: Secondary | ICD-10-CM

## 2023-06-30 DIAGNOSIS — M5030 Other cervical disc degeneration, unspecified cervical region: Secondary | ICD-10-CM

## 2023-06-30 DIAGNOSIS — Z7185 Encounter for immunization safety counseling: Secondary | ICD-10-CM

## 2023-06-30 DIAGNOSIS — Z0000 Encounter for general adult medical examination without abnormal findings: Secondary | ICD-10-CM | POA: Diagnosis not present

## 2023-06-30 DIAGNOSIS — Z125 Encounter for screening for malignant neoplasm of prostate: Secondary | ICD-10-CM

## 2023-06-30 DIAGNOSIS — L630 Alopecia (capitis) totalis: Secondary | ICD-10-CM

## 2023-06-30 DIAGNOSIS — M349 Systemic sclerosis, unspecified: Secondary | ICD-10-CM

## 2023-06-30 DIAGNOSIS — I7300 Raynaud's syndrome without gangrene: Secondary | ICD-10-CM

## 2023-06-30 DIAGNOSIS — J45991 Cough variant asthma: Secondary | ICD-10-CM

## 2023-06-30 MED ORDER — CARVEDILOL 3.125 MG PO TABS: 3.1250 mg | ORAL_TABLET | Freq: Two times a day (BID) | ORAL | 1 refills | Status: DC

## 2023-06-30 MED ORDER — HYDROCODONE-ACETAMINOPHEN 5-325 MG PO TABS: 1.0000 | ORAL_TABLET | Freq: Four times a day (QID) | ORAL | 0 refills | Status: DC | PRN

## 2023-06-30 NOTE — Progress Notes (Signed)
 Subjective: Chief Complaint  Patient presents with   Annual Exam    CPE, having trouble sleeping due to pain in rt. Arm from neuropathy, is seeing pain management and neurology,    Here for well visit, nonfasting.  He has had a significant year in the last year regarding medical issues.  In the past year he is seeing rheumatology, neurology, have new diagnosis of CIDP, limited systemic sclerosis, Raynaud's phenomena, ANA positive.  He just completed an 8-hour infusion of immunoglobulins this past weekend.  He has been having a lot of pain.  He has recently been referred to pain management whom he will see next week.  He has not been sleeping well in the past couple weeks due to the pain.  He is on gabapentin but still not helping.    He has follow-up with pulmonology and neurology next month in April.  He sees rheumatology again in July.   Patient Care Team: Davonn Flanery, Cleda Mccreedy as PCP - General (Family Medicine) [place], Peter M, MD as PCP - Cardiology (Cardiology) Glendale Chard, DO as Consulting Physician (Neurology) Pollyann Savoy, MD as Consulting Physician (Rheumatology) Tyrell Antonio, MD as Consulting Physician (Physical Medicine and Rehabilitation) Regal, Kirstie Peri, DPM as Consulting Physician (Podiatry) Vida Rigger, MD as Consulting Physician (Gastroenterology)   Reviewed their medical, surgical, family, social, medication, and allergy history and updated chart as appropriate.  No Known Allergies  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   History of sleep apnea 04/2012   per overnight screen/oximetry    Obesity    Thalassemia minor    Wears glasses     Current Outpatient Medications on File Prior to Visit  Medication Sig Dispense Refill   albuterol (VENTOLIN HFA) 108 (90 Base) MCG/ACT inhaler  Inhale 2 puffs into the lungs every 6 (six) hours as needed for wheezing or shortness of breath. 8 g 0   B Complex Vitamins (VITAMIN B COMPLEX PO) Take by mouth.     cetirizine (ZYRTEC) 10 MG tablet Take 10 mg by mouth daily.     gabapentin (NEURONTIN) 300 MG capsule Take 1 tablet at bedtime x 1 week, then 1 tablet twice daily x 1 week, then 1 tablet three times daily. 90 capsule 5   ipratropium (ATROVENT) 0.06 % nasal spray Place 2 sprays into both nostrils 4 (four) times daily. 15 mL 0   Multiple Vitamin (MULTIVITAMIN) tablet Take 1 tablet by mouth daily.     mycophenolate (CELLCEPT) 500 MG tablet Take 1 tablet in the morning and 2 tablet at bedtime. 270 tablet 1   rosuvastatin (CRESTOR) 20 MG tablet TAKE 1 TABLET BY MOUTH EVERYDAY AT BEDTIME 90 tablet 0   No current facility-administered medications on file prior to visit.      Current Outpatient Medications:    albuterol (VENTOLIN HFA) 108 (90 Base) MCG/ACT inhaler, Inhale 2 puffs into the lungs every 6 (six) hours as needed for wheezing or shortness of breath., Disp: 8 g, Rfl: 0   B Complex Vitamins (VITAMIN B COMPLEX PO), Take by mouth., Disp: , Rfl:    carvedilol (COREG) 3.125 MG tablet, Take 1 tablet (3.125 mg total) by mouth 2 (two) times daily with a meal., Disp: 60 tablet, Rfl: 1   cetirizine (  ZYRTEC) 10 MG tablet, Take 10 mg by mouth daily., Disp: , Rfl:    gabapentin (NEURONTIN) 300 MG capsule, Take 1 tablet at bedtime x 1 week, then 1 tablet twice daily x 1 week, then 1 tablet three times daily., Disp: 90 capsule, Rfl: 5   HYDROcodone-acetaminophen (NORCO/VICODIN) 5-325 MG tablet, Take 1 tablet by mouth every 6 (six) hours as needed for moderate pain (pain score 4-6)., Disp: 30 tablet, Rfl: 0   ipratropium (ATROVENT) 0.06 % nasal spray, Place 2 sprays into both nostrils 4 (four) times daily., Disp: 15 mL, Rfl: 0   Multiple Vitamin (MULTIVITAMIN) tablet, Take 1 tablet by mouth daily., Disp: , Rfl:    mycophenolate (CELLCEPT) 500  MG tablet, Take 1 tablet in the morning and 2 tablet at bedtime., Disp: 270 tablet, Rfl: 1   rosuvastatin (CRESTOR) 20 MG tablet, TAKE 1 TABLET BY MOUTH EVERYDAY AT BEDTIME, Disp: 90 tablet, Rfl: 0  Family History  Problem Relation Age of Onset   Cancer Mother 25       colon   Hypertension Mother    Dementia Father    Cancer Father        pancreas   Healthy Brother    Healthy Son    Heart disease Neg Hx    Lung disease Neg Hx     Past Surgical History:  Procedure Laterality Date   COLONOSCOPY  04/27/2010   [facility] physicians, normal per pt   COLONOSCOPY  08/2019   Dr. Vida Rigger, repeat in 5 years   TONSILLECTOMY     TONSILLECTOMY AND ADENOIDECTOMY     age 5yo     Review of Systems  Constitutional:  Negative for chills, fever, malaise/fatigue and weight loss.  HENT:  Negative for congestion, ear pain, hearing loss, sore throat and tinnitus.   Eyes:  Negative for blurred vision, pain and redness.  Respiratory:  Negative for cough, hemoptysis and shortness of breath.   Cardiovascular:  Negative for chest pain, palpitations, orthopnea, claudication and leg swelling.  Gastrointestinal:  Negative for abdominal pain, blood in stool, constipation, diarrhea, nausea and vomiting.  Genitourinary:  Negative for dysuria, flank pain, frequency, hematuria and urgency.  Musculoskeletal:  Positive for back pain, joint pain, myalgias and neck pain. Negative for falls.  Skin:  Negative for itching and rash.  Neurological:  Negative for dizziness, tingling, speech change, weakness and headaches.  Endo/Heme/Allergies:  Negative for polydipsia. Does not bruise/bleed easily.  Psychiatric/Behavioral:  Negative for depression and memory loss. The patient has insomnia. The patient is not nervous/anxious.         Objective:  BP (!) 144/90   Pulse 92   Ht 6\' 3"  (1.905 m)   Wt 279 lb 6.4 oz (126.7 kg)   BMI 34.92 kg/m   General appearance: alert, no distress, WD/WN, African American  male Skin: unremarkable, alopecia totalis HEENT: normocephalic, conjunctiva/corneas normal, sclerae anicteric, PERRLA, EOMi, nares patent, no discharge or erythema, pharynx normal Oral cavity: MMM, tongue normal, teeth normal Neck: supple, no lymphadenopathy, no thyromegaly, no masses,somewhat limited ROM, no bruits Chest: non tender, normal shape and expansion Heart: RRR, normal S1, S2, no murmurs Lungs: CTA bilaterally, no wheezes, rhonchi, or rales Abdomen: +bs, soft, non tender, non distended, no masses, no hepatomegaly, no splenomegaly, no bruits Back: non tender, normal ROM, no scoliosis Musculoskeletal: upper extremities non tender, no obvious deformity, normal ROM throughout, lower extremities non tender, no obvious deformity, normal ROM throughout Extremities: no edema, no cyanosis, no clubbing Pulses:  2+ symmetric, upper and lower extremities, normal cap refill Neurological: alert, oriented x 3, CN2-12 intact, strength normal upper extremities and lower extremities, sensation normal throughout, DTRs 2+ throughout, no cerebellar signs, gait normal Psychiatric: normal affect, behavior normal, pleasant  GU/rectal - deferred    Assessment and Plan :   Encounter Diagnoses  Name Primary?   Encounter for health maintenance examination in adult Yes   Essential hypertension, benign    Aneurysm of ascending aorta without rupture (HCC)    Screening for prostate cancer    Hyperlipidemia, unspecified hyperlipidemia type    Cough variant asthma    CIDP (chronic inflammatory demyelinating polyneuropathy) (HCC)    ANA positive    DDD (degenerative disc disease), cervical    Raynaud's phenomenon without gangrene    Limited systemic sclerosis (HCC)    Alopecia areata totalis    Vaccine counseling     This visit was a preventative care visit, also known as wellness visit or routine physical.   Topics typically include healthy lifestyle, diet, exercise, preventative care, vaccinations,  sick and well care, proper use of emergency dept and after hours care, as well as other concerns.     Separate significant issues discussed: Hypertension-new diagnosis today.  Possibly complicated by his recent pain issues.  Nevertheless the last few months have been elevated blood pressure readings and given his aneurysm finding on CT scan last year we will go ahead and initiate blood pressure medication.  Discussed risk and benefits and proper use of medication.  Begin trial of low-dose carvedilol.  Aortic aneurysm-we will plan to repeat CTA in July 2025.  Order placed  Vaccine counseling-I will reach out to his neurologist about vaccines and whether he is safe to go ahead and pursue this given his recent IVIG infusion  Cough variant asthma-can use albuterol as needed  Hyperlipidemia and coronary artery disease on CT scan 2024, continue statin.  Return soon for fasting labs  Continue follow-up with rheumatology regarding Raynaud's, degenerative disc disease, ANA positive, limited systemic sclerosis  Continue follow-up with neurology regarding CIDP diagnosis     General Recommendations: Continue to return yearly for your annual wellness and preventative care visits.  This gives [place] a chance to discuss healthy lifestyle, exercise, vaccinations, review your chart record, and perform screenings where appropriate.  I recommend you see your eye doctor yearly for routine vision care.  I recommend you see your dentist yearly for routine dental care including hygiene visits twice yearly.   Vaccination  Immunization History  Administered Date(s) Administered   Influenza Split 02/07/2009, 03/04/2010, 02/15/2012   Influenza, Mdck, Trivalent,PF 6+ MOS(egg free) 01/12/2023   Influenza,inj,Quad PF,6+ Mos 02/15/2012, 03/10/2013, 06/14/2014, 02/01/2015, 02/08/2017, 03/10/2018, 02/16/2019, 04/05/2020, 01/06/2021, 01/29/2022   Influenza-Unspecified 05/15/2016   Moderna Sars-Covid-2 Vaccination  07/13/2019, 08/10/2019   PFIZER Comirnaty(Gray Top)Covid-19 Tri-Sucrose Vaccine 08/23/2020   PFIZER(Purple Top)SARS-COV-2 Vaccination 03/02/2020   Pfizer Covid-19 Vaccine Bivalent Booster 28yrs & up 01/06/2021   Pfizer(Comirnaty)Fall Seasonal Vaccine 12 years and older 01/12/2023   Tdap 02/15/2012, 08/10/2012   Unspecified SARS-COV-2 Vaccination 01/29/2022   Zoster Recombinant(Shingrix) 06/08/2020, 08/07/2020    Vaccine recommendations: You are due for several vaccines, tetanus, pneumococcal.  I will communicate with your neurologist about the timing of this since you had the recent IVIG infusion   Screening for cancer: Colon cancer screening: I reviewed your 2021 colonoscopy.  He will be due for repeat next year in 2026   Prostate Cancer screening: The recommended prostate cancer screening test is a blood  test called the prostate-specific antigen (PSA) test. PSA is a protein that is made in the prostate. As you age, your prostate naturally produces more PSA. Abnormally high PSA levels may be caused by: Prostate cancer. An enlarged prostate that is not caused by cancer (benign prostatic hyperplasia, or BPH). This condition is very common in older men. A prostate gland infection (prostatitis) or urinary tract infection. Certain medicines such as male hormones (like testosterone) or other medicines that raise testosterone levels. A rectal exam may be done as part of prostate cancer screening to help provide information about the size of your prostate gland. When a rectal exam is performed, it should be done after the PSA level is drawn to avoid any effect on the results.   Skin cancer screening: Check your skin regularly for new changes, growing lesions, or other lesions of concern Come in for evaluation if you have skin lesions of concern.   Lung cancer screening: If you have a greater than 20 pack year history of tobacco use, then you may qualify for lung cancer screening with a  chest CT scan.   Please call your insurance company to inquire about coverage for this test.   Pancreatic cancer:  no current screening test is available or routinely recommended. (risk factors: smoking, overweight or obese, diabetes, chronic pancreatitis, work exposure - dry cleaning, metal working, 55yo>, M>F, Tree surgeon, family hx/o, hereditary breast, ovarian, melanoma, lynch, peutz-jeghers).  Symptoms: jaundice, dark urine, light color or greasy stools, itchy skin, belly or back pain, weight loss, poor appetite, nausea, vomiting, liver enlargement, DVT/blood clots.   We currently don't have screenings for other cancers besides breast, cervical, colon, and lung cancers.  If you have a strong family history of cancer or have other cancer screening concerns, please let me know.  Genetic testing referral is an option for individuals with high cancer risk in the family.  There are some other cancer screenings in development currently.   Bone health: Get at least 150 minutes of aerobic exercise weekly Get weight bearing exercise at least once weekly Bone density test:  A bone density test is an imaging test that uses a type of X-ray to measure the amount of calcium and other minerals in your bones. The test may be used to diagnose or screen you for a condition that causes weak or thin bones (osteoporosis), predict your risk for a broken bone (fracture), or determine how well your osteoporosis treatment is working. The bone density test is recommended for females 65 and older, or females or males <65 if certain risk factors such as thyroid disease, long term use of steroids such as for asthma or rheumatological issues, vitamin D deficiency, estrogen deficiency, family history of osteoporosis, self or family history of fragility fracture in first degree relative.    Heart health: Get at least 150 minutes of aerobic exercise weekly Limit alcohol It is important to maintain a healthy blood  pressure and healthy cholesterol numbers  Heart disease screening: Screening for heart disease includes screening for blood pressure, fasting lipids, glucose/diabetes screening, BMI height to weight ratio, reviewed of smoking status, physical activity, and diet.    Goals include blood pressure 120/80 or less, maintaining a healthy lipid/cholesterol profile, preventing diabetes or keeping diabetes numbers under good control, not smoking or using tobacco products, exercising most days per week or at least 150 minutes per week of exercise, and eating healthy variety of fruits and vegetables, healthy oils, and avoiding unhealthy food choices like fried  food, fast food, high sugar and high cholesterol foods.    Other tests may possibly include EKG test, CT coronary calcium score, echocardiogram, exercise treadmill stress test.     CT coronary test November 19, 2022 showed ascending aortic aneurysm 4.2 cm and they recommended annual imaging with CTA.  He also had a little bit of coronary atherosclerosis  I will put an order for repeat CTA this July 2025.  Continue statin.   Vascular disease screening: For higher risk individuals including smokers, diabetics, patients with known heart disease or high blood pressure, kidney disease, and others, screening for vascular disease or atherosclerosis of the arteries is available.  Examples may include carotid ultrasound, abdominal aortic ultrasound, ABI blood flow screening in the legs, thoracic aorta screening.   Medical care options: I recommend you continue to seek care here first for routine care.  We try really hard to have available appointments Monday through Friday daytime hours for sick visits, acute visits, and physicals.  Urgent care should be used for after hours and weekends for significant issues that cannot wait till the next day.  The emergency department should be used for significant potentially life-threatening emergencies.  The emergency  department is expensive, can often have long wait times for less significant concerns, so try to utilize primary care, urgent care, or telemedicine when possible to avoid unnecessary trips to the emergency department.  Virtual visits and telemedicine have been introduced since the pandemic started in 2020, and can be convenient ways to receive medical care.  We offer virtual appointments as well to assist you in a variety of options to seek medical care.   Legal  Take the time to do a last will and testament, Advanced Directives including Health Care Power of Attorney and Living Will documents.  Don't leave your family with burdens that can be handled ahead of time.   Advanced Directives: I recommend you consider completing a Health Care Power of Attorney and Living Will.   These documents respect your wishes and help alleviate burdens on your loved ones if you were to become terminally ill or be in a position to need those documents enforced.    You can complete Advanced Directives yourself, have them notarized, then have copies made for our office, for you and for anybody you feel should have them in safe keeping.  Or, you can have an attorney prepare these documents.   If you haven't updated your Last Will and Testament in a while, it may be worthwhile having an attorney prepare these documents together and save on some costs.       Spiritual and Emotional Health Keeping a healthy spiritual life can help you better manage your physical health. Your spiritual life can help you to cope with any issues that may arise with your physical health.  Balance can keep [place] healthy and help [place] to recover.  If you are struggling with your spiritual health there are questions that you may want to ask yourself:  What makes me feel most complete? When do I feel most connected to the rest of the world? Where do I find the most inner strength? What am I doing when I feel whole?  Helpful tips: Being in  nature. Some people feel very connected and at peace when they are walking outdoors or are outside. Helping others. Some feel the largest sense of wellbeing when they are of service to others. Being of service can take on many forms. It can be doing  volunteer work, being kind to strangers, or offering a hand to a friend in need. Gratitude. Some people find they feel the most connected when they remain grateful. They may make lists of all the things they are grateful for or say a thank you out loud for all they have.    Emotional Health Are you in tune with your emotional health?  Check out this link: http://www.marquez-love.com/    Financial Health Make sure you use a budget for your personal finances Make sure you are insured against risks (health insurance, life insurance, auto insurance, etc) Save more, spend less Set financial goals If you need help in this area, good resources include counseling through [employer] or other community resources, have a meeting with a Social research officer, government, and a good resource is the [employer]    Gaston was seen today for annual exam.  Diagnoses and all orders for this visit:  Encounter for health maintenance examination in adult -     PSA; Future -     Lipid panel; Future -     Comprehensive metabolic panel; Future -     CBC; Future  Essential hypertension, benign -     CT ANGIO CHEST AORTA W/ & OR WO/CM & GATING ([place] ONLY); Future  Aneurysm of ascending aorta without rupture (HCC) -     CT ANGIO CHEST AORTA W/ & OR WO/CM & GATING ([place] ONLY); Future  Screening for prostate cancer -     PSA; Future  Hyperlipidemia, unspecified hyperlipidemia type -     Lipid panel; Future  Cough variant asthma  CIDP (chronic inflammatory demyelinating polyneuropathy) (HCC)  ANA positive  DDD (degenerative disc disease), cervical  Raynaud's phenomenon without gangrene  Limited systemic sclerosis  (HCC)  Alopecia areata totalis  Vaccine counseling  Other orders -     HYDROcodone-acetaminophen (NORCO/VICODIN) 5-325 MG tablet; Take 1 tablet by mouth every 6 (six) hours as needed for moderate pain (pain score 4-6). -     carvedilol (COREG) 3.125 MG tablet; Take 1 tablet (3.125 mg total) by mouth 2 (two) times daily with a meal.    Follow-up pending labs, yearly for physical

## 2023-07-01 ENCOUNTER — Telehealth: Payer: Self-pay | Admitting: Internal Medicine

## 2023-07-01 NOTE — Telephone Encounter (Signed)
 Called and left detailed message on pt's vm about vaccines

## 2023-07-01 NOTE — Telephone Encounter (Signed)
-----   Message from Kristian Covey sent at 07/01/2023  9:05 AM EST ----- I spoke to your neurologist.  She is okay with you getting vaccines 2 weeks after an infusion but no earlier.  So if you want to pursue some of the vaccines we discussed, you need to wait at least 2 weeks after any of the infusions you are getting.  Return for fasting labs at your convenience ----- Message ----- From: Glendale Chard, DO Sent: 07/01/2023   8:58 AM EST To: Jac Canavan, PA-C  Hi Vincenza Hews,   I typically advise patients to get vaccines 2 weeks after their last infusion.  Updated tetanus and pneumococcal would be okay.  CBC and CMP would be great to add to his labs.  Thank you,  Donika ----- Message ----- From: Genia Del Sent: 06/30/2023   4:46 PM EST To: Glendale Chard, DO  Hello Dr. Allena Katz Given his recent IVIG, plan would you recommend him to have some updated vaccines such as tetanus and pneumococcal.  Should we wait a few months or do you feel he can go ahead and get some of these updated.  Also, he is returning to me for labs for his physical next week since he is nonfasting.  I am going to be checking his lipids and PSA.  Is any other blood work any needed well while he is here such as CMET, CBC? Thanks [employer]

## 2023-07-01 NOTE — Addendum Note (Signed)
 Addended by: Jac Canavan on: 07/01/2023 09:04 AM   Modules accepted: Orders

## 2023-07-07 ENCOUNTER — Other Ambulatory Visit

## 2023-07-07 DIAGNOSIS — E785 Hyperlipidemia, unspecified: Secondary | ICD-10-CM

## 2023-07-07 DIAGNOSIS — Z125 Encounter for screening for malignant neoplasm of prostate: Secondary | ICD-10-CM

## 2023-07-07 DIAGNOSIS — Z0000 Encounter for general adult medical examination without abnormal findings: Secondary | ICD-10-CM

## 2023-07-07 LAB — LIPID PANEL

## 2023-07-08 LAB — LIPID PANEL
Cholesterol, Total: 150 mg/dL (ref 100–199)
HDL: 33 mg/dL — ABNORMAL LOW (ref >39)
LDL CALC COMMENT:: 4.5 ratio (ref 0.0–5.0)
LDL Chol Calc (NIH): 96 mg/dL (ref 0–99)
Triglycerides: 112 mg/dL (ref 0–149)
VLDL Cholesterol Cal: 21 mg/dL (ref 5–40)

## 2023-07-08 LAB — COMPREHENSIVE METABOLIC PANEL
ALT: 23 IU/L (ref 0–44)
AST: 36 IU/L (ref 0–40)
Albumin: 4 g/dL (ref 3.8–4.9)
Alkaline Phosphatase: 77 IU/L (ref 44–121)
BUN/Creatinine Ratio: 11 (ref 9–20)
BUN: 11 mg/dL (ref 6–24)
Bilirubin Total: 0.5 mg/dL (ref 0.0–1.2)
CO2: 23 mmol/L (ref 20–29)
Calcium: 9 mg/dL (ref 8.7–10.2)
Chloride: 104 mmol/L (ref 96–106)
Creatinine, Ser: 0.97 mg/dL (ref 0.76–1.27)
Globulin, Total: 3.4 g/dL (ref 1.5–4.5)
Glucose: 102 mg/dL — ABNORMAL HIGH (ref 70–99)
Potassium: 4.3 mmol/L (ref 3.5–5.2)
Sodium: 139 mmol/L (ref 134–144)
Total Protein: 7.4 g/dL (ref 6.0–8.5)
eGFR: 93 mL/min/{1.73_m2} (ref >59)

## 2023-07-08 LAB — CBC
Hematocrit: 32.1 % — ABNORMAL LOW (ref 37.5–51.0)
Hemoglobin: 9.8 g/dL — ABNORMAL LOW (ref 13.0–17.7)
MCH: 23 pg — ABNORMAL LOW (ref 26.6–33.0)
MCHC: 30.5 g/dL — ABNORMAL LOW (ref 31.5–35.7)
MCV: 75 fL — ABNORMAL LOW (ref 79–97)
Platelets: 317 10*3/uL (ref 150–450)
RBC: 4.26 x10E6/uL (ref 4.14–5.80)
RDW: 18 % — ABNORMAL HIGH (ref 11.6–15.4)
WBC: 6.9 10*3/uL (ref 3.4–10.8)

## 2023-07-08 LAB — PSA: Prostate Specific Ag, Serum: 1.5 ng/mL (ref 0.0–4.0)

## 2023-07-08 NOTE — Progress Notes (Signed)
 Results sent through MyChart  Dr. Allena Katz and Dr. Corliss Skains, see his CBC results.  His recent hemoglobin dropped down.  Do you  typically see a drop in hemoglobin such as this with IV immunoglobulin such as what he had recently?  If I need to add folate, B12, iron or other labs let me know as we may still have some blood at the lab.  Thanks [employer]

## 2023-07-08 NOTE — Progress Notes (Signed)
 I would assume that it is hemodilution.  I would recommend repeating CBC in the near future to look for recovery in the hemoglobin.

## 2023-07-09 ENCOUNTER — Other Ambulatory Visit: Payer: Self-pay | Admitting: Internal Medicine

## 2023-07-09 ENCOUNTER — Other Ambulatory Visit: Payer: Self-pay | Admitting: Medical

## 2023-07-09 DIAGNOSIS — E611 Iron deficiency: Secondary | ICD-10-CM

## 2023-07-09 MED ORDER — CARVEDILOL 3.125 MG PO TABS: 3.1250 mg | ORAL_TABLET | Freq: Two times a day (BID) | ORAL | 2 refills | Status: DC

## 2023-07-09 MED ORDER — IPRATROPIUM BROMIDE 0.06 % NA SOLN: 2.0000 | Freq: Four times a day (QID) | NASAL | 2 refills | Status: AC

## 2023-07-09 MED ORDER — ROSUVASTATIN CALCIUM 20 MG PO TABS: ORAL_TABLET | ORAL | 2 refills | Status: DC

## 2023-07-09 NOTE — Progress Notes (Signed)
 Labs show a drop in his hemoglobin.  I discussed this with his neurologist and rheumatologist as well.  Lets plan to recheck a CBC blood count again in 2 to 3 weeks, nurse visit.  Liver kidney and electrolytes okay.  Blood sugar slightly elevated.  Cholesterol overall not bad improved from prior PSA prostate marker normal.  Continue current medications.  Sabrina-schedule 2 to 3 week nurse visit for CBC

## 2023-07-13 ENCOUNTER — Other Ambulatory Visit

## 2023-07-13 DIAGNOSIS — E611 Iron deficiency: Secondary | ICD-10-CM

## 2023-07-14 ENCOUNTER — Other Ambulatory Visit: Payer: Self-pay | Admitting: Medical

## 2023-07-14 DIAGNOSIS — M3500 Sicca syndrome, unspecified: Secondary | ICD-10-CM

## 2023-07-14 DIAGNOSIS — D649 Anemia, unspecified: Secondary | ICD-10-CM

## 2023-07-14 DIAGNOSIS — G6181 Chronic inflammatory demyelinating polyneuritis: Secondary | ICD-10-CM

## 2023-07-14 DIAGNOSIS — M349 Systemic sclerosis, unspecified: Secondary | ICD-10-CM

## 2023-07-14 LAB — CBC WITH DIFFERENTIAL/PLATELET
Basophils Absolute: 0.1 10*3/uL (ref 0.0–0.2)
Basos: 1 %
EOS (ABSOLUTE): 0.9 10*3/uL — ABNORMAL HIGH (ref 0.0–0.4)
Eos: 12 %
Hematocrit: 30.4 % — ABNORMAL LOW (ref 37.5–51.0)
Hemoglobin: 9.1 g/dL — ABNORMAL LOW (ref 13.0–17.7)
Immature Grans (Abs): 0 10*3/uL (ref 0.0–0.1)
Immature Granulocytes: 0 %
Lymphocytes Absolute: 1.8 10*3/uL (ref 0.7–3.1)
Lymphs: 25 %
MCH: 22.8 pg — ABNORMAL LOW (ref 26.6–33.0)
MCHC: 29.9 g/dL — ABNORMAL LOW (ref 31.5–35.7)
MCV: 76 fL — ABNORMAL LOW (ref 79–97)
Monocytes Absolute: 0.7 10*3/uL (ref 0.1–0.9)
Monocytes: 10 %
Neutrophils Absolute: 3.7 10*3/uL (ref 1.4–7.0)
Neutrophils: 52 %
Platelets: 328 10*3/uL (ref 150–450)
RBC: 4 x10E6/uL — ABNORMAL LOW (ref 4.14–5.80)
RDW: 17.9 % — ABNORMAL HIGH (ref 11.6–15.4)
WBC: 7.2 10*3/uL (ref 3.4–10.8)

## 2023-07-14 NOTE — Progress Notes (Signed)
 I placed hematology referral  Thanks Dr. Corliss Skains and Dr. Allena Katz for your quick replies.

## 2023-07-14 NOTE — Progress Notes (Signed)
 Devin Erickson, Thank you for forwarding remains labs.  I am uncertain about the etiology of drop in his hemoglobin.  I would appreciate if he will refer him to hematology for evaluation.  I am forwarding labs to Dr. Allena Katz as well as patient is on CellCept.

## 2023-07-14 NOTE — Progress Notes (Signed)
 Results sent through MyChart

## 2023-07-27 ENCOUNTER — Telehealth: Payer: Self-pay | Admitting: Internal Medicine

## 2023-07-27 NOTE — Telephone Encounter (Signed)
 Copied from CRM [id number]. Topic: Referral - Status >> Jul 27, 2023  3:47 PM Patsy Lager T wrote: Reason for CRM: Tamika from [facility] stated they have attempted to reach patient several times with no success so they are closing the referral

## 2023-07-28 ENCOUNTER — Ambulatory Visit: Payer: Managed Care, Other (non HMO) | Admitting: Pulmonary Disease

## 2023-07-29 ENCOUNTER — Telehealth: Payer: Self-pay | Admitting: Hematology and Oncology

## 2023-08-02 ENCOUNTER — Ambulatory Visit (INDEPENDENT_AMBULATORY_CARE_PROVIDER_SITE_OTHER): Payer: Managed Care, Other (non HMO) | Admitting: Neurology

## 2023-08-02 ENCOUNTER — Encounter: Payer: Self-pay | Admitting: Neurology

## 2023-08-02 VITALS — BP 119/74 | HR 89 | Ht 75.0 in | Wt 271.0 lb

## 2023-08-02 DIAGNOSIS — G6181 Chronic inflammatory demyelinating polyneuritis: Secondary | ICD-10-CM

## 2023-08-02 NOTE — Progress Notes (Signed)
 Follow-up Visit   Date: 08/02/2023    Dent Plantz MRN: [id number] DOB: 1970/09/01    Devin Erickson is a 53 y.o. right-handed African American male with hyperlipidemia and anxiety returning to the clinic for follow-up of polyradiculoneuropathy.  The patient was accompanied to the clinic by self.   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 He was unable to do IVIG due to time constraints and opted to start Cellcept.  Because of worsening symptoms, he decided to restart IVIG in January 2025. Cellcept has been discontinued.  Clinically, right hand weakness is stable with no new weakness.   Today, he reports having intermittent abnormal sensation in the left leg, exam is normal so will continue to monitor - Continue IVIG 1g/kg every 4 weeks - Consider NCS/EMG of the legs, if symptoms get worse  2.  Cervical spondylosis with mild multilevel foraminal stenosis at C5-6 and C6-7 which would not cause the severity of his hand weakness.    3.  Limited systemic sclerosis, followed by Dr. Corliss Skains.  He is reports increased skin changes of the scalp and forehead.   Return to clinic in 3 months   --------------------------------------------- History of present illness: Starting around April 2023, he began noticing mild weakness in the arm especially when weight lifting, it was more effortful on the right side.  Over the summer, he noticed weakness in grip on the right hand which continued to progress to the point where he is unable to use his hand to crank his ignition, turning keys to entire his home, grasping objects, and using utensils.  He is dropping things frequently.  He has difficulty straightening the hand.  He denies weakness with raising  the arm or bending at the elbow.  He has mild pain in the right shoulder, described as a nagging pain.  He treated it with tylenol.  Occasionally, he has numbness over the tips of the fingers.     He denies similar symptoms in the left hand.  No numbness/tingling in the feet.  He denies any ongoing pain or neck pain.  MRI cervical spine was performed in July which shows cervical spondylosis and multilevel foraminal stenosis at right C3-C4, bilateral C4-5 and left C6-7. Additionally, there was note of edema signal in the C6 and C7 spinous process as well as interspinous ligaments at C5-6, C6-7, C7-T1, and T1-T2.     NCS/EMG performed in September shows diffuse prolonged latency, slowed motor conduction velocity, and reduced amplitude involving the median and ulnar nerves.  Needle EMG shows active fibrillation potentials in the FDI, ABP, EDC, and biceps muscles.  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 Labs indicate positive ANA.   UPDATE 03/25/2022:  He is here to discuss results of EMG which favors a polyradiculoneuropathy affecting both arms, worse on the right.  He reports having discomfort in the left shoulder, similar to what he experienced in the beginning on the right.  No numbness/tinging of the left arm.  His right hand remains unchanged and continues to have weakness.  He has difficulty with fine motor tasks.   UPDATE 06/01/2022:  He is here for follow-up visit and discuss LP results.  There has been no significant  change with his hand weakness or tingling, which remains worse in the right hand.  No similar symptoms in the legs or imbalance.  LP shows elevated protein of 116, normal white count and presence of oligoclonal bands.  IgG is normal.   UPDATE 09/15/2022:  He is here for follow-up visit.  Since his last visit, he continued to have  additional monthly solumedrol and reports no significant change.  He is having mild tingling in the left hand and has noticed some weakness.   He was evaluated by Dr. 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    UPDATE 12/29/2022:  He is here for follow-up.  He has not noticed any new weakness of the hand and feels that symptoms stabilized with slight improvement.  He is tolerating Cellcept 500mg  BID.  No new weakness, numbness/tingling.  He will be seeing rheumatology next week.  He has noticed that it takes longer for him to get over a viral illness now.  He has a URI several weeks ago and only now starting to feel better.   He reports having right shoulder stiffness which started a few weeks ago.  More recently, he began noticing progressive weakness in the right hand.  He is unable to hold items in the right hand, such as a fork.  He has had a few spells of right leg buckling.  No new tingling or numbness.   UPDATE 05/04/2023:  He is having more right shoulder pain, which is described as achy/pressure over the scapula.  Sleeping on the right side makes it worse.  He has tried tylenol which provides some benefit.  No improvement with flexeril which his PCP prescribed.  He has not started IVIG as it was recently approved, but is scheduled to have this on 1/28.  He feels that right hand is overall stable, weakness is worse in the evening.    UPDATE 08/02/2023:  He is here for follow-up visit.  He has been on IVIG and reports no improvement or worsening.  He is tolerating IVIG well.  Overall, he feels that right hand is stable.  Sometimes, he feels that his left leg is starting to feel different - no numbness, tingling, or weakness.  He is also seeing pain management because of ongoing right shoulder pain.  He takes Lyrica 150mg  daily which controls his pain much better.   He has noticed increased skin lesion and nodules involving the forehead and  scalp.   Medications:  Current Outpatient Medications on File Prior to Visit  Medication Sig Dispense Refill   albuterol (VENTOLIN HFA) 108 (90 Base) MCG/ACT inhaler Inhale 2 puffs into the lungs every 6 (six) hours as needed for wheezing or shortness of breath. 8 g 0   B Complex Vitamins (VITAMIN B COMPLEX PO) Take by mouth.     carvedilol (COREG) 3.125 MG tablet Take 1 tablet (3.125 mg total) by mouth 2 (two) times daily with a meal. 180 tablet 2   cetirizine (ZYRTEC) 10 MG tablet Take 10 mg by mouth daily.     gabapentin (NEURONTIN) 300 MG capsule Take 1 tablet at bedtime x 1 week, then 1 tablet twice daily x 1 week, then 1 tablet three times daily. 90 capsule 5   HYDROcodone-acetaminophen (NORCO/VICODIN) 5-325 MG tablet Take 1 tablet by mouth every 6 (six) hours as needed for moderate pain (pain score 4-6). 30 tablet 0   ipratropium (ATROVENT) 0.06 % nasal spray Place 2 sprays  into both nostrils 4 (four) times daily. 15 mL 2   Multiple Vitamin (MULTIVITAMIN) tablet Take 1 tablet by mouth daily.     mycophenolate (CELLCEPT) 500 MG tablet Take 1 tablet in the morning and 2 tablet at bedtime. 270 tablet 1   rosuvastatin (CRESTOR) 20 MG tablet TAKE 1 TABLET BY MOUTH EVERYDAY AT BEDTIME 90 tablet 2   No current facility-administered medications on file prior to visit.    Allergies: No Known Allergies  Vital Signs:  BP 119/74   Pulse 89   Ht 6\' 3"  (1.905 m)   Wt 271 lb (122.9 kg)   SpO2 97%   BMI 33.87 kg/m   Neurological Exam: MENTAL STATUS including orientation to time, place, person, recent and remote memory, attention span and concentration, language, and fund of knowledge is normal.  Speech is not dysarthric.  CRANIAL NERVES:   Normal conjugate, extra-ocular eye movements in all directions of gaze.  No ptosis.  Face is symmetric. Palate elevates symmetrically.  Tongue is midline.  MOTOR:  Right FDI, ADM, ABP, and medial forearm atrophy. He has muscle tenderness over the right  scapula.   No fasciculations or abnormal movements.  No pronator drift.    Upper Extremity:  Right   Left  Deltoid  5/5    5/5   Biceps  5/5    5/5   Triceps  5/5    5/5   Wrist extensors  5/5    5/5   Wrist flexors  4/5    5/5   Finger extensors  2+/5    5/5   Finger flexors  3+/5    5/5   Dorsal interossei  1/5    5-/5   Abductor pollicis  3/5    5/5   Tone (Ashworth scale)  0   0    Lower Extremity:  Right   Left  Hip flexors  5/5    5/5   Knee flexors  5/5    5/5   Dorsiflexors  5/5    5/5   Plantarflexors  5/5    5/5   Toe extensors  5/5    5/5   Toe flexors  5/5    5/5   Tone (Ashworth scale)  0   0    MSRs:                                              Right        Left brachioradialis tr   1+  biceps tr   1+  triceps tr   1+  patellar 1+   1+  ankle jerk 1+   1+  Hoffman no   no  plantar response down   down    SENSORY:  Reduced temperature in the right hand, compared to the left.  Sensation intact in the legs.     COORDINATION/GAIT: Normal finger-to- nose-finger.  Gait narrow based and stable.   Data: Labs 02/18/2022:  vitamin B12 880, folate > 23, CRP <1.0, ESR 16, TSH 1.65, ANCA neg, ACE 33, copper 110, SPEP with IFE no M protein, cryo neg, vitamin B1 7*, ANA positive, centromere antibody 1:640*  NCS/EMG of bilateral arms 03/24/2022 performed at [facility]: This is a complex study.  Findings are most suggestive of a subacute demyelinating and axonal polyradiculoneuropathy affecting the upper extremities, which is worse on  the right.    NCS/EMG of the right arm 01/08/2022 performed at [facility]: EMG & NCV Findings:  Impression: The above electrodiagnostic study is ABNORMAL but is difficult to fully interpret.  The needle EMG shows significant denervation but active motor unit potentials in multiple muscles which could be consistent with cervical stenosis but the MRI does not show any cervical stenosis.  This could be a brachial plexus type lesion  although it does not seem to fit to a specific trauma although it is closest to an upper trunk brachial plexus lesion.  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    However the patient seems to have a concomitant median neuropathy at the wrist but also slowing of the ulnar nerve distal to the elbow and this would represent more of a demyelination type lesion on top of what was seen with the denervation potentials.     MRI cervical spine wo contrast 11/03/21: Intermittently motion degraded exam.  Indeterminate 18 mm T2 STIR hyperintense and T1 hypointense lesion within the T1 vertebral body. While this may reflect an atypical hemangioma, alternative etiologies (including osseous metastatic disease) cannot be excluded. Short-interval 2-3 month MRI follow-up without and with contrast is recommended to monitor this finding.   Marrow edema within the C6 and C7 spinous processes. Edema signal also present within the interspinous ligaments at C5-C6, C6-C7, C7-T1 and T1-T2. Findings are nonspecific, but possibly degenerative or posttraumatic in etiology. If there has been recent trauma, the interspinous ligament edema could reflect interspinous ligament injury and a cervical spine CT should be considered to exclude C6/C7 spinous process fractures. Additionally, attention to these sites recommended at MRI imaging follow-up.   Cervical spondylosis, as outlined. No more than mild relative spinal canal narrowing. Multilevel foraminal stenosis, as detailed and greatest on the right at C3-C4 (moderate), bilaterally at C4-C5 (mild-to-moderate) and on the left at C6-C7 (mild-to-moderate).   Nonspecific straightening of the expected cervical lordosis.  CSF 05/26/2022:  R0 W1 P116*  G56   ACE 5, IgG index 0.54, MBP pending, OCB present*, cytology negative    Thank you for allowing me to participate in patient's care.  If I can answer any additional questions, I would be pleased to do so.     Sincerely,    Gennette Shadix K. Allena Katz, DO /

## 2023-08-06 ENCOUNTER — Telehealth: Payer: Self-pay | Admitting: Neurology

## 2023-08-06 NOTE — Telephone Encounter (Signed)
 Peer-to-peer completed.  [employer]- C approved [id number], valid for three months from 08/06/23.

## 2023-08-11 ENCOUNTER — Telehealth: Payer: Self-pay

## 2023-08-11 NOTE — Telephone Encounter (Signed)
 Spoke with patient and confirmed appointment on 4/17

## 2023-08-12 ENCOUNTER — Inpatient Hospital Stay: Attending: Hematology and Oncology | Admitting: Hematology and Oncology

## 2023-08-12 ENCOUNTER — Inpatient Hospital Stay

## 2023-08-12 VITALS — BP 150/86 | HR 84 | Temp 99.6°F | Resp 17 | Wt 275.3 lb

## 2023-08-12 DIAGNOSIS — D509 Iron deficiency anemia, unspecified: Secondary | ICD-10-CM | POA: Diagnosis not present

## 2023-08-12 DIAGNOSIS — Z800 Family history of malignant neoplasm of digestive organs: Secondary | ICD-10-CM | POA: Diagnosis not present

## 2023-08-12 DIAGNOSIS — G629 Polyneuropathy, unspecified: Secondary | ICD-10-CM | POA: Diagnosis not present

## 2023-08-12 DIAGNOSIS — D563 Thalassemia minor: Secondary | ICD-10-CM | POA: Diagnosis not present

## 2023-08-12 DIAGNOSIS — M349 Systemic sclerosis, unspecified: Secondary | ICD-10-CM | POA: Diagnosis not present

## 2023-08-12 LAB — CBC WITH DIFFERENTIAL/PLATELET
Abs Immature Granulocytes: 0.02 10*3/uL (ref 0.00–0.07)
Basophils Absolute: 0 10*3/uL (ref 0.0–0.1)
Basophils Relative: 0 %
Eosinophils Absolute: 0 10*3/uL (ref 0.0–0.5)
Eosinophils Relative: 1 %
HCT: 28.9 % — ABNORMAL LOW (ref 39.0–52.0)
Hemoglobin: 9.5 g/dL — ABNORMAL LOW (ref 13.0–17.0)
Immature Granulocytes: 0 %
Lymphocytes Relative: 21 %
Lymphs Abs: 1.5 10*3/uL (ref 0.7–4.0)
MCH: 21.2 pg — ABNORMAL LOW (ref 26.0–34.0)
MCHC: 32.9 g/dL (ref 30.0–36.0)
MCV: 64.4 fL — ABNORMAL LOW (ref 80.0–100.0)
Monocytes Absolute: 0.7 10*3/uL (ref 0.1–1.0)
Monocytes Relative: 9 %
Neutro Abs: 5 10*3/uL (ref 1.7–7.7)
Neutrophils Relative %: 69 %
Platelets: 243 10*3/uL (ref 150–400)
RBC: 4.49 MIL/uL (ref 4.22–5.81)
RDW: 18.4 % — ABNORMAL HIGH (ref 11.5–15.5)
WBC: 7.3 10*3/uL (ref 4.0–10.5)
nRBC: 0 % (ref 0.0–0.2)

## 2023-08-12 LAB — CMP (CANCER CENTER ONLY)
ALT: 22 U/L (ref 0–44)
AST: 29 U/L (ref 15–41)
Albumin: 4.3 g/dL (ref 3.5–5.0)
Alkaline Phosphatase: 50 U/L (ref 38–126)
Anion gap: 5 (ref 5–15)
BUN: 23 mg/dL — ABNORMAL HIGH (ref 6–20)
CO2: 26 mmol/L (ref 22–32)
Calcium: 9.4 mg/dL (ref 8.9–10.3)
Chloride: 109 mmol/L (ref 98–111)
Creatinine: 1.11 mg/dL (ref 0.61–1.24)
GFR, Estimated: >60 mL/min (ref >60)
Glucose, Bld: 95 mg/dL (ref 70–99)
Potassium: 4 mmol/L (ref 3.5–5.1)
Sodium: 140 mmol/L (ref 135–145)
Total Bilirubin: 0.5 mg/dL (ref 0.0–1.2)
Total Protein: 8 g/dL (ref 6.5–8.1)

## 2023-08-12 LAB — IRON AND IRON BINDING CAPACITY (CC-WL,HP ONLY)
Iron: 36 ug/dL — ABNORMAL LOW (ref 45–182)
Saturation Ratios: 9 % — ABNORMAL LOW (ref 17.9–39.5)
TIBC: 417 ug/dL (ref 250–450)
UIBC: 381 ug/dL — ABNORMAL HIGH (ref 117–376)

## 2023-08-12 LAB — RETICULOCYTES
Immature Retic Fract: 27.6 % — ABNORMAL HIGH (ref 2.3–15.9)
RBC.: 4.39 MIL/uL (ref 4.22–5.81)
Retic Count, Absolute: 59.3 10*3/uL (ref 19.0–186.0)
Retic Ct Pct: 1.4 % (ref 0.4–3.1)

## 2023-08-12 LAB — LACTATE DEHYDROGENASE: LDH: 218 U/L — ABNORMAL HIGH (ref 98–192)

## 2023-08-12 LAB — VITAMIN B12: Vitamin B-12: 866 pg/mL (ref 180–914)

## 2023-08-12 NOTE — Progress Notes (Signed)
 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 -Order blood work to monitor blood counts and assess for common causes of anemia. - Review lab results and contact if significant findings are noted. -Plan for a telephone visit in one week.  Thalassemia trait Thalassemia trait results in microcytosis, does not typically cause anemia unless compounded by other factors. - No specific treatment required.  Systemic sclerosis Systemic sclerosis contributing to symptoms such as arm weakness and ligament tightening. - Continue current management.  Neuropathy Neuropathy in the arm causing severe pain, under care of a pain specialist. - Proceed with planned nerve cauterization procedure. - Continue pregabalin for neuropathy management. - Monitor for changes in symptoms and side effects of medication.   Orders Placed This Encounter  Procedures   CBC with Differential/Platelet    Standing Status:   Standing    Number of Occurrences:   22    Expiration Date:   08/11/2024   Iron and Iron Binding Capacity ([facility] only)    Standing Status:   Future    Number of Occurrences:   1    Expiration Date:   08/11/2024   Ferritin    Standing Status:   Future    Number of Occurrences:   1    Expiration Date:   08/11/2024   Vitamin B12    Standing Status:   Future     Number of Occurrences:   1    Expiration Date:   08/11/2024   Folate RBC    Standing Status:   Future    Number of Occurrences:   1    Expiration Date:   08/11/2024   SPEP with reflex to IFE    Standing Status:   Future    Number of Occurrences:   1    Expiration Date:   08/11/2024   Kappa/lambda light chains    Standing Status:   Future    Number of Occurrences:   1    Expiration Date:   08/11/2024   IgG, IgA, IgM    Standing Status:   Future    Number of Occurrences:   1    Expiration Date:   08/11/2024   Lactate dehydrogenase    Standing Status:   Future    Number of Occurrences:   1    Expiration Date:   08/11/2024   Reticulocytes    Standing Status:   Future    Number of Occurrences:   1    Expiration Date:   08/11/2024   CMP (Cancer Center only)    Standing Status:   Future    Number of Occurrences:   1    Expiration Date:   08/11/2024     HISTORY OF PRESENTING ILLNESS:  Devin Erickson 53 y.o. male is here because of anemia.  Discussed the use of AI scribe software for clinical note transcription with the patient, who gave verbal consent to proceed.  History of Present Illness Devin Erickson is  a 53 year old male with systemic sclerosis and anemia who presents for evaluation of anemia.  He is currently receiving immunoglobulin IVIG infusions and was previously on Cellcept, which was discontinued about a month ago due to anemia. Despite stopping Cellcept, there has been no significant improvement in energy levels or reduction in shortness of breath.  He has a history of systemic sclerosis, initially presenting with weakness and tightening in his arm. He experiences neuropathy in his arm, causing extreme pain, and is under the care of a pain specialist. He is currently on pregabalin for neuropathy, along with Crestor, a multivitamin, and carvedilol.  He has a thalassemia trait, present since childhood, causing smaller red blood cells but no significant anemia  until recently. No history of blood transfusions, bleeding, or significant weight loss. His last colonoscopy in 2021 was unremarkable except for hemorrhoids.  He maintains a relatively healthy diet with no changes in appetite or weight. No family history of autoimmune diseases.  All other systems were reviewed with the patient and are negative.  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 Vida Rigger, repeat in 5 years   TONSILLECTOMY     TONSILLECTOMY AND ADENOIDECTOMY     age 42yo    SOCIAL HISTORY: Social History   Socioeconomic History   Marital status: Married    Spouse name: Not on file   Number of children: 1   Years of education: Not on file   Highest education level: 12th grade  Occupational History   Not on file  Tobacco Use   Smoking status: Never    Passive exposure: Never   Smokeless tobacco: Never  Vaping Use   Vaping status: Never Used  Substance and Sexual Activity   Alcohol use: No    Comment: rarely   Drug use: No   Sexual activity: Not on file  Other Topics Concern   Not on file  Social History Narrative   Married, son, 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  Jehoviah Witness.  01/2021         Right Handed    Lives in a one story home    Occasionally caffeine   Social Drivers of Health   Financial Resource Strain: Low Risk  (06/29/2023)   Overall Financial Resource Strain (CARDIA)    Difficulty of Paying Living Expenses: Not very hard  Food  Insecurity: No Food Insecurity (06/29/2023)   Hunger Vital Sign    Worried About Running Out of Food in the Last Year: Never true    Ran Out of Food in the Last Year: Never true  Transportation Needs: No Transportation Needs (06/29/2023)   PRAPARE - Administrator, Civil Service (Medical): No    Lack of Transportation (Non-Medical): No  Physical Activity: Sufficiently Active (06/29/2023)   Exercise Vital Sign    Days of Exercise per Week: 4 days  Minutes of Exercise per Session: 60 min  Stress: Stress Concern Present (06/29/2023)   [employer] of Occupational Health - Occupational Stress Questionnaire    Feeling of Stress : Rather much  Social Connections: Socially Integrated (06/29/2023)   Social Connection and Isolation Panel [NHANES]    Frequency of Communication with Friends and Family: More than three times a week    Frequency of Social Gatherings with Friends and Family: Once a week    Attends Religious Services: More than 4 times per year    Active Member of [employer] or Organizations: Yes    Attends Engineer, structural: More than 4 times per year    Marital Status: Married  Catering manager Violence: Not on file    FAMILY HISTORY: Family History  Problem Relation Age of Onset   Cancer Mother 65       colon   Hypertension Mother    Dementia Father    Cancer Father        pancreas   Healthy Brother    Healthy Son    Heart disease Neg Hx    Lung disease Neg Hx     ALLERGIES:  has no known allergies.  MEDICATIONS:  Current Outpatient Medications  Medication Sig Dispense Refill   albuterol (VENTOLIN HFA) 108 (90 Base) MCG/ACT inhaler Inhale 2 puffs into the lungs every 6 (six) hours as needed for wheezing or shortness of breath. 8 g 0   B Complex Vitamins (VITAMIN B COMPLEX PO) Take by mouth.     carvedilol (COREG) 3.125 MG tablet Take 1 tablet (3.125 mg total) by mouth 2 (two) times daily with a meal. 180 tablet 2   cetirizine (ZYRTEC) 10 MG tablet  Take 10 mg by mouth daily.     ipratropium (ATROVENT) 0.06 % nasal spray Place 2 sprays into both nostrils 4 (four) times daily. 15 mL 2   Multiple Vitamin (MULTIVITAMIN) tablet Take 1 tablet by mouth daily.     pregabalin (LYRICA) 150 MG capsule Take 150 mg by mouth daily.     rosuvastatin (CRESTOR) 20 MG tablet TAKE 1 TABLET BY MOUTH EVERYDAY AT BEDTIME 90 tablet 2   No current facility-administered medications for this visit.     PHYSICAL EXAMINATION: ECOG PERFORMANCE STATUS: 0 - Asymptomatic  Vitals:   08/12/23 1512  BP: (!) 150/86  Pulse: 84  Resp: 17  Temp: 99.6 F (37.6 C)  SpO2: 98%   Filed Weights   08/12/23 1512  Weight: 275 lb 4.8 oz (124.9 kg)    GENERAL:alert, no distress and comfortable SKIN: skin color, texture, turgor are normal, no rashes or significant lesions EYES: normal, conjunctiva are pink and non-injected, sclera clear OROPHARYNX:no exudate, no erythema and lips, buccal mucosa, and tongue normal  NECK: supple, thyroid normal size, non-tender, without nodularity LYMPH:  no palpable lymphadenopathy in the cervical, axillary  LUNGS: clear to auscultation and percussion with normal breathing effort HEART: regular rate & rhythm and no murmurs and no lower extremity edema ABDOMEN:abdomen soft, non-tender and normal bowel sounds Musculoskeletal:no cyanosis of digits and no clubbing  PSYCH: alert & oriented x 3 with fluent speech NEURO:  right arm muscle wasting, claw hand noted.  LABORATORY DATA:  I have reviewed the data as listed Lab Results  Component Value Date   WBC 7.3 08/12/2023   HGB 9.5 (L) 08/12/2023   HCT 28.9 (L) 08/12/2023   MCV 64.4 (L) 08/12/2023   PLT 243 08/12/2023     Chemistry  Component Value Date/Time   NA 140 08/12/2023 1533   NA 139 07/07/2023 0849   K 4.0 08/12/2023 1533   CL 109 08/12/2023 1533   CO2 26 08/12/2023 1533   BUN 23 (H) 08/12/2023 1533   BUN 11 07/07/2023 0849   CREATININE 1.11 08/12/2023 1533    CREATININE 1.16 09/11/2022 1022      Component Value Date/Time   CALCIUM 9.4 08/12/2023 1533   ALKPHOS 50 08/12/2023 1533   AST 29 08/12/2023 1533   ALT 22 08/12/2023 1533   BILITOT 0.5 08/12/2023 1533       RADIOGRAPHIC STUDIES: I have personally reviewed the radiological images as listed and agreed with the findings in the report. No results found.  All questions were answered. The patient knows to call the clinic with any problems, questions or concerns. I spent 45 minutes in the care of this patient including H and P, review of records, counseling and coordination of care.     Murleen Arms, MD 08/12/2023 5:20 PM

## 2023-08-13 ENCOUNTER — Telehealth: Payer: Self-pay | Admitting: Hematology and Oncology

## 2023-08-13 LAB — FOLATE RBC
Folate, Hemolysate: 455 ng/mL
Folate, RBC: 1413 ng/mL (ref >498)
Hematocrit: 32.2 % — ABNORMAL LOW (ref 37.5–51.0)

## 2023-08-13 LAB — IGG, IGA, IGM
IgA: 193 mg/dL (ref 90–386)
IgG (Immunoglobin G), Serum: 2173 mg/dL — ABNORMAL HIGH (ref 603–1613)
IgM (Immunoglobulin M), Srm: 113 mg/dL (ref 20–172)

## 2023-08-13 LAB — FERRITIN: Ferritin: 11 ng/mL — ABNORMAL LOW (ref 24–336)

## 2023-08-13 NOTE — Telephone Encounter (Signed)
 Spoke with patient confirming upcoming appointment

## 2023-08-16 LAB — KAPPA/LAMBDA LIGHT CHAINS
Kappa free light chain: 15.3 mg/L (ref 3.3–19.4)
Kappa, lambda light chain ratio: 1.04 (ref 0.26–1.65)
Lambda free light chains: 14.7 mg/L (ref 5.7–26.3)

## 2023-08-16 LAB — PROTEIN ELECTROPHORESIS, SERUM, WITH REFLEX
A/G Ratio: 1 (ref 0.7–1.7)
Albumin ELP: 3.9 g/dL (ref 2.9–4.4)
Alpha-1-Globulin: 0.3 g/dL (ref 0.0–0.4)
Alpha-2-Globulin: 0.6 g/dL (ref 0.4–1.0)
Beta Globulin: 1 g/dL (ref 0.7–1.3)
Gamma Globulin: 1.9 g/dL — ABNORMAL HIGH (ref 0.4–1.8)
Globulin, Total: 3.8 g/dL (ref 2.2–3.9)
Total Protein ELP: 7.7 g/dL (ref 6.0–8.5)

## 2023-08-17 ENCOUNTER — Telehealth: Payer: Self-pay | Admitting: *Deleted

## 2023-08-17 NOTE — Telephone Encounter (Signed)
 Made several attempts to call pt. Left voice message on pt personal cell of message below. Advised to call office if he needs further understanding and instruction

## 2023-08-17 NOTE — Telephone Encounter (Signed)
-----   Message from [place] sent at 08/13/2023  2:59 PM EDT ----- Continue oral iron supplementation ferrous sulfate 65 mg/325 daily.

## 2023-08-19 ENCOUNTER — Inpatient Hospital Stay: Admitting: Hematology and Oncology

## 2023-08-19 DIAGNOSIS — D509 Iron deficiency anemia, unspecified: Secondary | ICD-10-CM

## 2023-08-19 NOTE — Progress Notes (Signed)
 [facility] CONSULT NOTE  Patient Care Team: Tysinger, Christiane Cowing, PA-C as PCP - General (Family Medicine) [place], Peter M, MD as PCP - Cardiology (Cardiology) Daryel Ensign, DO as Consulting Physician (Neurology) Nicholas Bari, MD as Consulting Physician (Rheumatology) Bridget Campion, MD as Consulting Physician (Physical Medicine and Rehabilitation) Regal, Angus Kenning, DPM as Consulting Physician (Podiatry) Ozell Blunt, MD as Consulting Physician (Gastroenterology)  CHIEF COMPLAINTS/PURPOSE OF CONSULTATION:  Anemia  ASSESSMENT & PLAN:  Assessment & Plan Anemia, microcytic hypochromic We reviewed labs today over the telephone. It appears that he is iron deficient, ferritin 11. No hemolysis, mildly elevated LDH likely from systemic sclerosis. No evidence of monoclonal protein. 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 He will continue oral iron daily for 8 weeks come back for repeat lab  Inbasket message sent to scheduling and nursing team for future follow up. Labs ordered.  HISTORY OF PRESENTING ILLNESS:  Devin Erickson 53 y.o. male is here because of anemia.  Discussed the use of AI scribe software for clinical note transcription with the patient, who gave verbal consent to proceed.  History of Present Illness Devin Erickson is a 53 year old male with systemic sclerosis and anemia who presents for evaluation of anemia.  He is currently receiving immunoglobulin IVIG infusions and was previously on Cellcept , which was discontinued about a month ago due to anemia. Despite stopping Cellcept , there has been no significant improvement in energy levels or reduction in shortness of breath.  He has a history of systemic sclerosis, initially presenting with weakness and tightening in his arm. He experiences neuropathy in his arm, causing extreme pain, and is under the care of a pain specialist. He is currently on pregabalin for neuropathy, along with Crestor , a  multivitamin, and carvedilol .  He has a thalassemia trait, present since childhood, causing smaller red blood cells but no significant anemia until recently. No history of blood transfusions, bleeding, or significant weight loss. His last colonoscopy in 2021 was unremarkable except for hemorrhoids.  He is here for a telephone visit. Since his last visit here, he just started taking oral iron today. He denies any complaints today, he has his nerve procedure end of May.  All other systems were reviewed with the patient and are negative.  MEDICAL HISTORY:  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   History of sleep apnea 04/2012   per overnight screen/oximetry    Obesity    Thalassemia minor    Wears glasses     SURGICAL HISTORY: Past Surgical History:  Procedure Laterality Date   COLONOSCOPY  04/27/2010   [facility] physicians, normal per pt   COLONOSCOPY  08/2019   Dr. Ozell Blunt, repeat in 5 years   TONSILLECTOMY     TONSILLECTOMY AND ADENOIDECTOMY     age 16yo    SOCIAL HISTORY: Social History   Socioeconomic History   Marital status: Married    Spouse name: Not on file   Number of children: 1   Years of education: Not on file   Highest education level: 12th grade  Occupational History   Not on file  Tobacco Use   Smoking status: Never    Passive exposure: Never   Smokeless tobacco: Never  Vaping Use   Vaping status: Never  Used  Substance and Sexual Activity   Alcohol use: No    Comment: rarely   Drug use: No   Sexual activity: Not on file  Other Topics Concern   Not on file  Social History Narrative   Married, son, Andy Bannister - cleaning products, manufacturing, exercise - going gym 4 days per week.  Jehoviah Witness.  01/2021         Right Handed    Lives in a one story home    Occasionally  caffeine    Social Drivers of Health   Financial Resource Strain: Low Risk  (06/29/2023)   Overall Financial Resource Strain (CARDIA)    Difficulty of Paying Living Expenses: Not very hard  Food Insecurity: No Food Insecurity (06/29/2023)   Hunger Vital Sign    Worried About Running Out of Food in the Last Year: Never true    Ran Out of Food in the Last Year: Never true  Transportation Needs: No Transportation Needs (06/29/2023)   PRAPARE - Administrator, Civil Service (Medical): No    Lack of Transportation (Non-Medical): No  Physical Activity: Sufficiently Active (06/29/2023)   Exercise Vital Sign    Days of Exercise per Week: 4 days    Minutes of Exercise per Session: 60 min  Stress: Stress Concern Present (06/29/2023)   [employer] of Occupational Health - Occupational Stress Questionnaire    Feeling of Stress : Rather much  Social Connections: Socially Integrated (06/29/2023)   Social Connection and Isolation Panel [NHANES]    Frequency of Communication with Friends and Family: More than three times a week    Frequency of Social Gatherings with Friends and Family: Once a week    Attends Religious Services: More than 4 times per year    Active Member of [employer] or Organizations: Yes    Attends Engineer, structural: More than 4 times per year    Marital Status: Married  Catering manager Violence: Not on file    FAMILY HISTORY: Family History  Problem Relation Age of Onset   Cancer Mother 53       colon   Hypertension Mother    Dementia Father    Cancer Father        pancreas   Healthy Brother    Healthy Son    Heart disease Neg Hx    Lung disease Neg Hx     ALLERGIES:  has no known allergies.  MEDICATIONS:  Current Outpatient Medications  Medication Sig Dispense Refill   albuterol  (VENTOLIN  HFA) 108 (90 Base) MCG/ACT inhaler Inhale 2 puffs into the lungs every 6 (six) hours as needed for wheezing or shortness of breath. 8 g 0   B Complex Vitamins  (VITAMIN B COMPLEX PO) Take by mouth.     carvedilol  (COREG ) 3.125 MG tablet Take 1 tablet (3.125 mg total) by mouth 2 (two) times daily with a meal. 180 tablet 2   cetirizine (ZYRTEC) 10 MG tablet Take 10 mg by mouth daily.     ipratropium (ATROVENT ) 0.06 % nasal spray Place 2 sprays into both nostrils 4 (four) times daily. 15 mL 2   Multiple Vitamin (MULTIVITAMIN) tablet Take 1 tablet by mouth daily.     pregabalin (LYRICA) 150 MG capsule Take 150 mg by mouth daily.     rosuvastatin  (CRESTOR ) 20 MG tablet TAKE 1 TABLET BY MOUTH EVERYDAY AT BEDTIME 90 tablet 2   No current facility-administered medications for this visit.     PHYSICAL EXAMINATION: ECOG  PERFORMANCE STATUS: 0 - Asymptomatic  There were no vitals filed for this visit.  There were no vitals filed for this visit.   PE deferred, telephone visit. He didn't appear to be in any distress.  LABORATORY DATA:  I have reviewed the data as listed Lab Results  Component Value Date   WBC 7.3 08/12/2023   HGB 9.5 (L) 08/12/2023   HCT 32.2 (L) 08/12/2023   MCV 64.4 (L) 08/12/2023   PLT 243 08/12/2023     Chemistry      Component Value Date/Time   NA 140 08/12/2023 1533   NA 139 07/07/2023 0849   K 4.0 08/12/2023 1533   CL 109 08/12/2023 1533   CO2 26 08/12/2023 1533   BUN 23 (H) 08/12/2023 1533   BUN 11 07/07/2023 0849   CREATININE 1.11 08/12/2023 1533   CREATININE 1.16 09/11/2022 1022      Component Value Date/Time   CALCIUM  9.4 08/12/2023 1533   ALKPHOS 50 08/12/2023 1533   AST 29 08/12/2023 1533   ALT 22 08/12/2023 1533   BILITOT 0.5 08/12/2023 1533       RADIOGRAPHIC STUDIES: I have personally reviewed the radiological images as listed and agreed with the findings in the report. No results found.  All questions were answered. The patient knows to call the clinic with any problems, questions or concerns. I spent 10 minutes in the care of this patient including H and P, review of records, counseling and  coordination of care.  I connected with  Constancia Delton on 08/19/23 by a telephone application and verified that I am speaking with the correct person using two identifiers.   I discussed the limitations of evaluation and management by telemedicine. The patient expressed understanding and agreed to proceed.;  Location of patient: Home Location of provider: office.    Murleen Arms, MD 08/19/2023 3:29 PM

## 2023-08-20 ENCOUNTER — Telehealth: Payer: Self-pay | Admitting: Hematology and Oncology

## 2023-08-20 NOTE — Telephone Encounter (Signed)
 Spoke with patient confirming upcoming appointment

## 2023-08-26 ENCOUNTER — Telehealth (INDEPENDENT_AMBULATORY_CARE_PROVIDER_SITE_OTHER): Admitting: Medical

## 2023-08-26 VITALS — Wt 270.0 lb

## 2023-08-26 DIAGNOSIS — L03012 Cellulitis of left finger: Secondary | ICD-10-CM | POA: Diagnosis not present

## 2023-08-26 MED ORDER — AMOXICILLIN-POT CLAVULANATE 875-125 MG PO TABS: 1.0000 | ORAL_TABLET | Freq: Two times a day (BID) | ORAL | 0 refills | Status: DC

## 2023-08-26 NOTE — Progress Notes (Signed)
 Subjective:     Patient ID: Devin Erickson, male   DOB: 10-Jul-1970, 53 y.o.   MRN: [id number]  This visit type was conducted due to national recommendations for restrictions regarding the COVID-19 Pandemic (e.g. social distancing) in an effort to limit this patient's exposure and mitigate transmission in our community.  Due to their co-morbid illnesses, this patient is at least at moderate risk for complications without adequate follow up.  This format is felt to be most appropriate for this patient at this time.    Documentation for virtual audio and video telecommunications through [place] encounter:  The patient was located at home. The provider was located in the office. The patient did consent to this visit and is aware of possible charges through their insurance for this visit.  The other persons participating in this telemedicine service were none. Time spent on call was 20 minutes and in review of previous records 20 minutes total.  This virtual service is not related to other E/M service within previous 7 days.   HPI Chief Complaint  Patient presents with   Acute Visit    Left middle finger infected, been like this over a week, getting worse   Virtual consult for finger infection.  He had a similar infection about a year ago.  For the last week he has had a little bit of pain in slight swelling and discoloration of the lateral nailbed of his left middle finger.  No recent trauma or injury.  No recent event that he think would have triggered this.  He has recently been on medication that can decrease the immune system, been dealing with therapy regarding chronic inflammatory demyelinating neuropathy.  No fever, no chills.  No other aggravating or relieving factors. No other complaint.   Review of Systems As in subjective    Objective:   Physical Exam Due to coronavirus pandemic stay at home measures, patient visit was virtual and they were not examined in person.    Left middle finger medial surface with a small area of yellowish coloration that suggest possible small area of pus pocket, no obvious major swelling or erythema in the.  He does note that is tender in that area     Assessment:     Encounter Diagnosis  Name Primary?   Paronychia of finger of left hand Yes       Plan:     We discussed findings, possible triggers.  Keep the hands clean with soap and water.  If you are staying dry or cracking of the skin use daily moisturizing lotion.  Begin on antibiotic below for finger infection paronychia.  We discussed that we typically do an incision and drainage in the office but he is on virtual today.  If not much improved over the next 2 to 3 days then come in for in person visit.  Can use Tylenol  for pain.  [name] was seen today for acute visit.  Diagnoses and all orders for this visit:  Paronychia of finger of left hand  Other orders -     amoxicillin -clavulanate (AUGMENTIN ) 875-125 MG tablet; Take 1 tablet by mouth 2 (two) times daily.  As needed

## 2023-08-27 ENCOUNTER — Telehealth: Admitting: Medical

## 2023-09-08 ENCOUNTER — Ambulatory Visit: Admitting: Pulmonary Disease

## 2023-09-08 ENCOUNTER — Encounter: Payer: Self-pay | Admitting: Pulmonary Disease

## 2023-09-08 VITALS — BP 118/80 | HR 79 | Ht 75.0 in | Wt 276.6 lb

## 2023-09-08 DIAGNOSIS — M349 Systemic sclerosis, unspecified: Secondary | ICD-10-CM

## 2023-09-08 DIAGNOSIS — M3500 Sicca syndrome, unspecified: Secondary | ICD-10-CM

## 2023-09-08 NOTE — Patient Instructions (Signed)
 VISIT SUMMARY:  Today, you were seen for an evaluation of potential lung involvement due to your systemic sclerosis. You have a history of systemic sclerosis, chronic inflammatory demyelinating polyneuropathy (CIDP), and possible Sjogren's syndrome. We discussed your current symptoms, reviewed your previous CT scan, and planned further tests to monitor your lung health.  YOUR PLAN:  -SYSTEMIC SCLEROSIS WITH LUNG MONITORING: Systemic sclerosis is a chronic connective tissue disease that can affect the skin and internal organs, including the lungs. Although your previous CT scan showed no lung issues, we will continue to monitor your lung health. We will order a new CT scan in the next few weeks and schedule a lung function test in 3-4 months to ensure there are no changes.  -RAYNAUD'S PHENOMENON: Raynaud's phenomenon is a condition where small blood vessels in your fingers and toes spasm in response to cold or stress, causing them to turn white or blue. This is associated with your systemic sclerosis. No new treatment was discussed today.  -CHRONIC INFLAMMATORY DEMYELINATING POLYNEUROPATHY (CIDP): CIDP is a neurological disorder that causes progressive weakness and impaired sensory function in the legs and arms. You are currently receiving monthly IVIG infusions to manage this condition. We will continue with this treatment plan.  -SJOGREN'S SYNDROME: Sjogren's syndrome is an autoimmune disorder that often occurs with other such diseases, like systemic sclerosis. It primarily affects the glands that produce tears and saliva. We will continue to monitor this condition as part of your overall health management.  INSTRUCTIONS:  Please schedule your CT scan in the next few weeks and a lung function test in 3-4 months. Continue with your monthly IVIG infusions for CIDP. Follow up with your rheumatologist as needed for ongoing management of your systemic sclerosis and related conditions.

## 2023-09-08 NOTE — Progress Notes (Addendum)
 Devin Erickson    [id number]    [birth year]  Primary Care Physician:Tysinger, Christiane Cowing, PA-C  Referring Physician: Nicholas Bari, MD [address] [place],  [place] [address]  Chief complaint: Concern for interstitial lung disease evaluation History of  HPI: 53 y.o. who  has a past medical history of Anxiety, Depression (07/2017), Eczema, H/O exercise stress test (2009), Hair loss, History of sleep apnea (04/2012), Obesity, Thalassemia minor, and Wears glasses.  Discussed the use of AI scribe software for clinical note transcription with the patient, who gave verbal consent to proceed.  History of Present Illness Devin Erickson is a 53 year old male with systemic sclerosis who presents for evaluation of potential lung involvement. He was referred by Dr. Alvira Josephs, his rheumatologist, for evaluation of potential lung involvement due to limited systemic sclerosis.  He has a history of limited systemic sclerosis, diagnosed over a year ago, and is currently being evaluated for potential lung involvement. A CT scan from June of last year was performed to assess lung involvement. No current lung symptoms are present, and there is no known exposure to environmental factors such as mold or feather pillows.  He has chronic inflammatory demyelinating polyneuropathy (CIDP) and receives monthly IVIG infusions. Previously, he was on IVIG which was changed to CellCept  earlier this year.  However CellCept , which was discontinued around April due to worsening symptoms and he is back on IVIG.  His neuropathy symptoms include weakness and pale discoloration of his fingers, which sometimes turn blue, and he experiences 'little dots' in them.  Follows with Dr. Lydia Sams for neurology  Previously evaluated by [facility] rheumatology and there is a consideration of Sjogren's syndrome in relation to his immune system issues but does not need specific therapy for Sjogren's and limited specific  sclerosis at present  Pets: Has pets including a couple of cats and a dog Occupation:He works in a Chief Strategy Officer room products Exposures: No h/o chemo/XRT/amiodarone/macrodantin/MTX  No exposure to asbestos, silica or other organic allergens  Smoking history: Non-smoker Travel history: Originally from [place] .  Moved to [place]  around 2000.  No significant recent travel Relevant family history:No family history of lung disease.  Outpatient Encounter Medications as of 09/08/2023  Medication Sig   albuterol  (VENTOLIN  HFA) 108 (90 Base) MCG/ACT inhaler Inhale 2 puffs into the lungs every 6 (six) hours as needed for wheezing or shortness of breath.   amoxicillin -clavulanate (AUGMENTIN ) 875-125 MG tablet Take 1 tablet by mouth 2 (two) times daily.   B Complex Vitamins (VITAMIN B COMPLEX PO) Take by mouth.   carvedilol  (COREG ) 3.125 MG tablet Take 1 tablet (3.125 mg total) by mouth 2 (two) times daily with a meal.   cetirizine (ZYRTEC) 10 MG tablet Take 10 mg by mouth daily.   JOURNAVX 50 MG TABS TAKE 2 TABLETS BY MOUTH ON DAY 1, THEN 1 TABLET BY MOUTH TWICE DAILY THEREAFTER   Multiple Vitamin (MULTIVITAMIN) tablet Take 1 tablet by mouth daily.   pregabalin (LYRICA) 150 MG capsule Take 150 mg by mouth daily.   rosuvastatin  (CRESTOR ) 20 MG tablet TAKE 1 TABLET BY MOUTH EVERYDAY AT BEDTIME   ipratropium (ATROVENT ) 0.06 % nasal spray Place 2 sprays into both nostrils 4 (four) times daily. (Patient not taking: Reported on 09/08/2023)   QNASL  80 MCG/ACT AERS Place 1 spray into both nostrils 2 (two) times daily. (Patient not taking: Reported on 09/08/2023)   No facility-administered encounter medications on file as of  09/08/2023.    Allergies as of 09/08/2023   (No Known Allergies)    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   Eczema    H/O exercise stress test 2009   [facility], normal per pt   Hair loss    rapid total  loss 10/2012   History of sleep apnea 04/2012   per overnight screen/oximetry    Obesity    Thalassemia minor    Wears glasses     Past Surgical History:  Procedure Laterality Date   COLONOSCOPY  04/27/2010   [facility] physicians, normal per pt   COLONOSCOPY  08/2019   Dr. Ozell Blunt, repeat in 5 years   TONSILLECTOMY     TONSILLECTOMY AND ADENOIDECTOMY     age 38yo    Family History  Problem Relation Age of Onset   Cancer Mother 30       colon   Hypertension Mother    Dementia Father    Cancer Father        pancreas   Healthy Brother    Healthy Son    Heart disease Neg Hx    Lung disease Neg Hx     Social History   Socioeconomic History   Marital status: Married    Spouse name: Not on file   Number of children: 1   Years of education: Not on file   Highest education level: 12th grade  Occupational History   Not on file  Tobacco Use   Smoking status: Never    Passive exposure: Never   Smokeless tobacco: Never  Vaping Use   Vaping status: Never Used  Substance and Sexual Activity   Alcohol use: No    Comment: rarely   Drug use: No   Sexual activity: Not on file  Other Topics Concern   Not on file  Social History Narrative   Married, son, Andy Bannister - cleaning products, manufacturing, exercise - going gym 4 days per week.  Jehoviah Witness.  01/2021         Right Handed    Lives in a one story home    Occasionally caffeine    Social Drivers of Health   Financial Resource Strain: Low Risk  (06/29/2023)   Overall Financial Resource Strain (CARDIA)    Difficulty of Paying Living Expenses: Not very hard  Food Insecurity: No Food Insecurity (06/29/2023)   Hunger Vital Sign    Worried About Running Out of Food in the Last Year: Never true    Ran Out of Food in the Last Year: Never true  Transportation Needs: No Transportation Needs (06/29/2023)   PRAPARE - Administrator, Civil Service (Medical): No    Lack of Transportation (Non-Medical): No   Physical Activity: Sufficiently Active (06/29/2023)   Exercise Vital Sign    Days of Exercise per Week: 4 days    Minutes of Exercise per Session: 60 min  Stress: Stress Concern Present (06/29/2023)   [employer] of Occupational Health - Occupational Stress Questionnaire    Feeling of Stress : Rather much  Social Connections: Socially Integrated (06/29/2023)   Social Connection and Isolation Panel [NHANES]    Frequency of Communication with Friends and Family: More than three times a week    Frequency of Social Gatherings with Friends and Family: Once a week    Attends Religious Services: More than 4 times per year    Active Member of Clubs or  Organizations: Yes    Attends Engineer, structural: More than 4 times per year    Marital Status: Married  Catering manager Violence: Not on file    Review of systems: Review of Systems  Constitutional: Negative for fever and chills.  HENT: Negative.   Eyes: Negative for blurred vision.  Respiratory: as per HPI  Cardiovascular: Negative for chest pain and palpitations.  Gastrointestinal: Negative for vomiting, diarrhea, blood per rectum. Genitourinary: Negative for dysuria, urgency, frequency and hematuria.  Musculoskeletal: Negative for myalgias, back pain and joint pain.  Skin: Negative for itching and rash.  Neurological: Negative for dizziness, tremors, focal weakness, seizures and loss of consciousness.  Endo/Heme/Allergies: Negative for environmental allergies.  Psychiatric/Behavioral: Negative for depression, suicidal ideas and hallucinations.  All other systems reviewed and are negative.  Physical Exam: Blood pressure 118/80, pulse 79, height 6\' 3"  (1.905 m), weight 276 lb 9.6 oz (125.5 kg), SpO2 97%. Gen:      No acute distress HEENT:  EOMI, sclera anicteric Neck:     No masses; no thyromegaly Lungs:    Clear to auscultation bilaterally; normal respiratory effort CV:         Regular rate and rhythm; no murmurs Abd:       + bowel sounds; soft, non-tender; no palpable masses, no distension Ext:    No edema; adequate peripheral perfusion Skin:      Warm and dry; no rash Neuro: alert and oriented x 3 Psych: normal mood and affect  Data Reviewed: Imaging: High resolution CT 10/18/2022-no evidence of interstitial lung disease, coronary atherosclerosis, 4.2 cm ascending thoracic aorta.  I have reviewed the images personally.  PFTs: Spirometry 09/04/2015 FVC 4.02, FEV1 3.26 [4+], F/F81 Normal spirometry  Labs:  Cardiac: Echocardiogram 11/24/2022-LVEF 60 to 65%, normal RV systolic size and function.  Normal PA systolic pressure.  Estimated RVSP 25.5  Assessment & Plan Limited systemic sclerosis with lung monitoring Systemic sclerosis diagnosed over a year ago with no current evidence of lung involvement. Previous CT scan in June 2024 showed no interstitial lung disease. Regular monitoring is necessary to detect potential lung damage early. - Order CT scan in the next few weeks - Schedule lung function test in 3-4 months  Raynaud's phenomenon Scleroderma Managed by rheumatology.  No specific therapy at present.  Chronic inflammatory demyelinating polyneuropathy (CIDP) CIDP with ongoing treatment. Currently on monthly IVIG infusions. CellCept  was discontinued last month.    Recommendations: High resolution CT, PFTs  Phyllis Breeze MD [facility] 09/08/2023, 4:08 PM  CC: Nicholas Bari, MD

## 2023-09-22 ENCOUNTER — Ambulatory Visit
Admission: RE | Admit: 2023-09-22 | Discharge: 2023-09-22 | Disposition: A | Discharge status: Home / Self Care | Source: Ambulatory Visit | Attending: Pulmonary Disease | Admitting: Pulmonary Disease

## 2023-09-22 ENCOUNTER — Other Ambulatory Visit

## 2023-09-22 DIAGNOSIS — M3500 Sicca syndrome, unspecified: Secondary | ICD-10-CM

## 2023-09-22 DIAGNOSIS — M349 Systemic sclerosis, unspecified: Secondary | ICD-10-CM

## 2023-09-29 ENCOUNTER — Ambulatory Visit (INDEPENDENT_AMBULATORY_CARE_PROVIDER_SITE_OTHER): Admitting: Podiatry

## 2023-09-29 ENCOUNTER — Encounter: Payer: Self-pay | Admitting: Podiatry

## 2023-09-29 VITALS — Ht 75.0 in | Wt 276.0 lb

## 2023-09-29 DIAGNOSIS — M7751 Other enthesopathy of right foot: Secondary | ICD-10-CM | POA: Diagnosis not present

## 2023-09-29 MED ORDER — TRIAMCINOLONE ACETONIDE 10 MG/ML IJ SUSP
10.0000 mg | Freq: Once | INTRAMUSCULAR | Status: AC
Start: 2023-09-29 — End: 2023-09-29
  Administered 2023-09-29: 10 mg via INTRA_ARTICULAR

## 2023-09-30 NOTE — Progress Notes (Signed)
 Subjective:   Patient ID: Devin Erickson, male   DOB: 53 y.o.   MRN: [id number]   HPI Patient presents with continued discomfort around the base of the fifth metatarsal right with fluid buildup around the area and inability to wear shoe gear comfortably neurovascular   ROS      Objective:  Physical Exam  Status intact inflammation fluid base of fifth metatarsal right with lesion that forms within the fluid-filled area     Assessment:  Inflammatory condition base of fifth metatarsal right fluid buildup     Plan:  H&P reviewed today I went ahead did sterile prep injected the capsule of the base of the fifth metatarsal both plantar lateral 3 mg Dexasone Kenalog  5 mg Xylocaine  courtesy debridement of lesion reappoint as symptoms indicate

## 2023-10-11 ENCOUNTER — Ambulatory Visit: Payer: Self-pay | Admitting: Pulmonary Disease

## 2023-10-13 ENCOUNTER — Ambulatory Visit: Admitting: Hematology and Oncology

## 2023-10-13 ENCOUNTER — Other Ambulatory Visit

## 2023-10-19 ENCOUNTER — Telehealth: Payer: Self-pay

## 2023-10-19 NOTE — Telephone Encounter (Signed)
 Left message to confirm appt for 6/25

## 2023-10-20 ENCOUNTER — Inpatient Hospital Stay: Attending: Hematology and Oncology

## 2023-10-20 ENCOUNTER — Inpatient Hospital Stay (HOSPITAL_BASED_OUTPATIENT_CLINIC_OR_DEPARTMENT_OTHER): Admitting: Hematology and Oncology

## 2023-10-20 VITALS — BP 132/87 | HR 93 | Temp 100.5°F | Resp 17 | Wt 276.2 lb

## 2023-10-20 DIAGNOSIS — D509 Iron deficiency anemia, unspecified: Secondary | ICD-10-CM | POA: Insufficient documentation

## 2023-10-20 DIAGNOSIS — Z800 Family history of malignant neoplasm of digestive organs: Secondary | ICD-10-CM | POA: Insufficient documentation

## 2023-10-20 LAB — CBC WITH DIFFERENTIAL/PLATELET
Abs Immature Granulocytes: 0.01 10*3/uL (ref 0.00–0.07)
Basophils Absolute: 0.1 10*3/uL (ref 0.0–0.1)
Basophils Relative: 1 %
Eosinophils Absolute: 0.1 10*3/uL (ref 0.0–0.5)
Eosinophils Relative: 2 %
HCT: 28.7 % — ABNORMAL LOW (ref 39.0–52.0)
Hemoglobin: 9.8 g/dL — ABNORMAL LOW (ref 13.0–17.0)
Immature Granulocytes: 0 %
Lymphocytes Relative: 30 %
Lymphs Abs: 1.5 10*3/uL (ref 0.7–4.0)
MCH: 22.1 pg — ABNORMAL LOW (ref 26.0–34.0)
MCHC: 34.1 g/dL (ref 30.0–36.0)
MCV: 64.8 fL — ABNORMAL LOW (ref 80.0–100.0)
Monocytes Absolute: 0.6 10*3/uL (ref 0.1–1.0)
Monocytes Relative: 11 %
Neutro Abs: 2.7 10*3/uL (ref 1.7–7.7)
Neutrophils Relative %: 56 %
Platelets: 181 10*3/uL (ref 150–400)
RBC: 4.43 MIL/uL (ref 4.22–5.81)
RDW: 20.3 % — ABNORMAL HIGH (ref 11.5–15.5)
WBC: 4.9 10*3/uL (ref 4.0–10.5)
nRBC: 0 % (ref 0.0–0.2)

## 2023-10-20 LAB — IRON AND IRON BINDING CAPACITY (CC-WL,HP ONLY)
Iron: 69 ug/dL (ref 45–182)
Saturation Ratios: 19 % (ref 17.9–39.5)
TIBC: 365 ug/dL (ref 250–450)
UIBC: 296 ug/dL (ref 117–376)

## 2023-10-20 NOTE — Progress Notes (Signed)
 Devin Erickson  Patient Care Team: Tysinger, Alm RAMAN, PA-C as PCP - General (Family Medicine) [place], Peter M, MD as PCP - Cardiology (Cardiology) Tobie Tonita POUR, DO as Consulting Physician (Neurology) Dolphus Reiter, MD as Consulting Physician (Rheumatology) Eldonna Novel, MD as Consulting Physician (Physical Medicine and Rehabilitation) Regal, Pasco RAMAN, DPM as Consulting Physician (Podiatry) Rosalie Kitchens, MD as Consulting Physician (Gastroenterology)  CHIEF COMPLAINTS/PURPOSE OF CONSULTATION:  Anemia  ASSESSMENT & PLAN:   Assessment and Plan Assessment & Plan Iron deficiency anemia Persistent anemia with fatigue despite oral iron. Hemoglobin at 9.0 g/dL. Discussed IV iron due to poor oral response. Potential causes include bleeding or malabsorption. Explained IV iron benefits and risks. - Administer IV iron infusion in three divided doses at [employer] There are several formularies of intravenous iron available in the market.  We have discussed about risk of allergic/infusion reactions including potentially life-threatening anaphylaxis with intravenous iron however these serious allergic reactions are exceedingly rare and overestimated.  In contrast to serious allergic reactions, IV iron may be associated with nonallergic infusion reactions including self-limited urticaria, palpitations, dizziness, neck and back spasm which again occur in less than 1% of the individuals and do not progress to more serious reactions. - Monitor response and symptom improvement. - Consider gastroenterology referral if hemoglobin drops post-IV iron. - Follow up in three months to evaluate therapy effectiveness.   HISTORY OF PRESENTING ILLNESS:  Devin Erickson 53 y.o. male is here because of anemia.  Discussed the use of AI scribe software for clinical Erickson transcription with the patient, who gave verbal consent to proceed.  History of Present Illness   Devin Erickson is a 53 year old male with iron deficiency anemia who presents with persistent fatigue and low energy.  He has been experiencing persistent fatigue and limited energy despite eight weeks of oral iron supplementation. His hemoglobin levels have remained around 9.5 He takes gentle iron pills , full dose iron gives him epistaxis.  He denies any visible bleeding, such as from hemorrhoids. His last colonoscopy in 2021 revealed hemorrhoids and a small polyp, but no active bleeding was noted. He does not experience pica or cravings for non-food items, although he occasionally craves ice cream.  He reports variable sleep quality, sometimes sleeping as little as four and a half hours and other times up to seven and a half hours. No breathing difficulties or hematuria. His bowel movements are regular.  He works with medical cloth and Museum/gallery curator. He experiences occasional shortness of breath with exertion. No pica, visible bleeding, breathing difficulties, or hematuria.   All other systems were reviewed with the patient and are negative.  MEDICAL HISTORY:  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   History of sleep apnea 04/2012   per overnight screen/oximetry    Obesity    Thalassemia minor    Wears glasses     SURGICAL HISTORY: Past Surgical History:  Procedure Laterality Date   COLONOSCOPY  04/27/2010   [facility], normal per pt   COLONOSCOPY  08/2019   Dr. Kitchens Rosalie, repeat in 5 years   TONSILLECTOMY     TONSILLECTOMY AND ADENOIDECTOMY     age 49yo    SOCIAL HISTORY: Social History   Socioeconomic  History   Marital status: Married    Spouse name: Not on file   Number of children: 1   Years of education: Not on file   Highest education level: 12th grade  Occupational History   Not on file   Tobacco Use   Smoking status: Never    Passive exposure: Never   Smokeless tobacco: Never  Vaping Use   Vaping status: Never Used  Substance and Sexual Activity   Alcohol use: No    Comment: rarely   Drug use: No   Sexual activity: Not on file  Other Topics Concern   Not on file  Social History Narrative   Married, son, [employer] - cleaning products, manufacturing, exercise - going gym 4 days per week.  Jehoviah Witness.  01/2021         Right Handed    Lives in a one story home    Occasionally caffeine    Social Drivers of Health   Financial Resource Strain: Low Risk  (06/29/2023)   Overall Financial Resource Strain (CARDIA)    Difficulty of Paying Living Expenses: Not very hard  Food Insecurity: No Food Insecurity (06/29/2023)   Hunger Vital Sign    Worried About Running Out of Food in the Last Year: Never true    Ran Out of Food in the Last Year: Never true  Transportation Needs: No Transportation Needs (06/29/2023)   PRAPARE - Administrator, Civil Service (Medical): No    Lack of Transportation (Non-Medical): No  Physical Activity: Sufficiently Active (06/29/2023)   Exercise Vital Sign    Days of Exercise per Week: 4 days    Minutes of Exercise per Session: 60 min  Stress: Stress Concern Present (06/29/2023)   [employer] of Occupational Health - Occupational Stress Questionnaire    Feeling of Stress : Rather much  Social Connections: Socially Integrated (06/29/2023)   Social Connection and Isolation Panel    Frequency of Communication with Friends and Family: More than three times a week    Frequency of Social Gatherings with Friends and Family: Once a week    Attends Religious Services: More than 4 times per year    Active Member of [employer] or Organizations: Yes    Attends Engineer, structural: More than 4 times per year    Marital Status: Married  Catering manager Violence: Not on file    FAMILY HISTORY: Family History  Problem Relation Age  of Onset   Cancer Mother 41       colon   Hypertension Mother    Dementia Father    Cancer Father        pancreas   Healthy Brother    Healthy Son    Heart disease Neg Hx    Lung disease Neg Hx     ALLERGIES:  has no known allergies.  MEDICATIONS:  Current Outpatient Medications  Medication Sig Dispense Refill   albuterol  (VENTOLIN  HFA) 108 (90 Base) MCG/ACT inhaler Inhale 2 puffs into the lungs every 6 (six) hours as needed for wheezing or shortness of breath. 8 g 0   B Complex Vitamins (VITAMIN B COMPLEX PO) Take by mouth.     carvedilol  (COREG ) 3.125 MG tablet Take 1 tablet (3.125 mg total) by mouth 2 (two) times daily with a meal. 180 tablet 2   cetirizine (ZYRTEC) 10 MG tablet Take 10 mg by mouth daily.     ipratropium (ATROVENT ) 0.06 % nasal spray Place 2 sprays into both nostrils 4 (  four) times daily. 15 mL 2   JOURNAVX 50 MG TABS TAKE 2 TABLETS BY MOUTH ON DAY 1, THEN 1 TABLET BY MOUTH TWICE DAILY THEREAFTER     Multiple Vitamin (MULTIVITAMIN) tablet Take 1 tablet by mouth daily.     pregabalin (LYRICA) 150 MG capsule Take 150 mg by mouth daily.     QNASL  80 MCG/ACT AERS Place 1 spray into both nostrils 2 (two) times daily.     rosuvastatin  (CRESTOR ) 20 MG tablet TAKE 1 TABLET BY MOUTH EVERYDAY AT BEDTIME 90 tablet 2   No current facility-administered medications for this visit.     PHYSICAL EXAMINATION: ECOG PERFORMANCE STATUS: 0 - Asymptomatic  Vitals:   10/20/23 1549 10/20/23 1550  BP: (!) 141/87 132/87  Pulse: 93   Resp: 17   Temp: (!) 100.5 F (38.1 C)   SpO2: 98%     Filed Weights   10/20/23 1549  Weight: 276 lb 3.2 oz (125.3 kg)    Physical Exam Constitutional:      Appearance: Normal appearance.   Cardiovascular:     Rate and Rhythm: Normal rate and regular rhythm.     Pulses: Normal pulses.     Heart sounds: Normal heart sounds.  Pulmonary:     Effort: Pulmonary effort is normal.     Breath sounds: Normal breath sounds.    Musculoskeletal:        General: Normal range of motion.     Cervical back: Normal range of motion and neck supple. No rigidity.  Lymphadenopathy:     Cervical: No cervical adenopathy.   Skin:    General: Skin is warm and dry.   Neurological:     General: No focal deficit present.     Mental Status: He is alert.      LABORATORY DATA:  I have reviewed the data as listed Lab Results  Component Value Date   WBC 4.9 10/20/2023   HGB 9.8 (L) 10/20/2023   HCT 28.7 (L) 10/20/2023   MCV 64.8 (L) 10/20/2023   PLT 181 10/20/2023     Chemistry      Component Value Date/Time   NA 140 08/12/2023 1533   NA 139 07/07/2023 0849   K 4.0 08/12/2023 1533   CL 109 08/12/2023 1533   CO2 26 08/12/2023 1533   BUN 23 (H) 08/12/2023 1533   BUN 11 07/07/2023 0849   CREATININE 1.11 08/12/2023 1533   CREATININE 1.16 09/11/2022 1022      Component Value Date/Time   CALCIUM  9.4 08/12/2023 1533   ALKPHOS 50 08/12/2023 1533   AST 29 08/12/2023 1533   ALT 22 08/12/2023 1533   BILITOT 0.5 08/12/2023 1533       RADIOGRAPHIC STUDIES: I have personally reviewed the radiological images as listed and agreed with the findings in the report. CT CHEST HIGH RESOLUTION Result Date: 09/27/2023 CLINICAL DATA:  Interstitial lung disease.  Sjogren syndrome. EXAM: CT CHEST WITHOUT CONTRAST TECHNIQUE: Multidetector CT imaging of the chest was performed following the standard protocol without intravenous contrast. High resolution imaging of the lungs, as well as inspiratory and expiratory imaging, was performed. RADIATION DOSE REDUCTION: This exam was performed according to the departmental dose-optimization program which includes automated exposure control, adjustment of the mA and/or kV according to patient size and/or use of iterative reconstruction technique. COMPARISON:  10/13/2022. FINDINGS: Cardiovascular: Atherosclerotic calcification of the aorta with age advanced involvement of all 3 coronary arteries.  Ascending aorta measures up to 4.0 cm (coronal image  92), stable. Heart is at the upper limits of normal in size to mildly enlarged. No pericardial effusion. Mediastinum/Nodes: No pathologically enlarged mediastinal or axillary lymph nodes. Hilar regions are difficult to definitively evaluate without IV contrast. Esophagus is grossly unremarkable. Lungs/Pleura: Negative for subpleural reticulation, traction bronchiectasis/bronchiolectasis, ground glass, architectural distortion or honeycombing. No pleural fluid. Airway is unremarkable. Minimal air trapping. Upper Abdomen: Mild scarring in the right kidney. Visualized portions of the liver, gallbladder, adrenal glands, kidneys, spleen, pancreas, stomach and bowel are otherwise grossly unremarkable. No upper abdominal adenopathy. Musculoskeletal: Degenerative changes in the spine. IMPRESSION: 1. No evidence of interstitial lung disease. Minimal air trapping is indicative of small airways disease. 2. Age advanced three-vessel coronary artery calcification. 3. 4.0 cm ascending aortic aneurysm, stable. Recommend annual imaging followup by CTA or MRA. This recommendation follows 2010 ACCF/AHA/AATS/ACR/ASA/SCA/SCAI/SIR/STS/SVM Guidelines for the Diagnosis and Management of Patients with Thoracic Aortic Disease. Circulation. 2010; 121: Z733-z630. Aortic aneurysm NOS (ICD10-I71.9). 4.  Aortic atherosclerosis (ICD10-I70.0). Electronically Signed   By: Newell Eke M.D.   On: 09/27/2023 13:33    All questions were answered. The patient knows to call the clinic with any problems, questions or concerns. I spent 30 minutes in the care of this patient including H and P, review of records, counseling and coordination of care.   Amber Stalls, MD 10/20/2023 4:32 PM

## 2023-10-21 LAB — FERRITIN: Ferritin: 36 ng/mL (ref 24–336)

## 2023-10-25 ENCOUNTER — Telehealth: Payer: Self-pay

## 2023-10-25 ENCOUNTER — Other Ambulatory Visit: Payer: Self-pay | Admitting: *Deleted

## 2023-10-25 ENCOUNTER — Other Ambulatory Visit: Payer: Self-pay | Admitting: Hematology and Oncology

## 2023-10-25 NOTE — Telephone Encounter (Signed)
 Dr. Loretha, patient will be scheduled as soon as possible.  Auth Submission: NO AUTH NEEDED Site of care: Site of care: [facility] Payer: [employer] commercial Medication & CPT/J Code(s) submitted: Venofer (Iron Sucrose) J1756 Diagnosis Code:  Route of submission (phone, fax, portal):  Phone # Fax # Auth type: Buy/Bill PB Units/visits requested: 300mg  x 3 doses Reference number:  Approval from: 10/25/23 to 02/24/24

## 2023-10-27 ENCOUNTER — Ambulatory Visit (HOSPITAL_COMMUNITY)
Admission: RE | Admit: 2023-10-27 | Discharge: 2023-10-27 | Disposition: A | Discharge status: Home / Self Care | Source: Ambulatory Visit | Attending: Medical | Admitting: Medical

## 2023-10-27 DIAGNOSIS — I10 Essential (primary) hypertension: Secondary | ICD-10-CM | POA: Insufficient documentation

## 2023-10-27 DIAGNOSIS — I7121 Aneurysm of the ascending aorta, without rupture: Secondary | ICD-10-CM | POA: Insufficient documentation

## 2023-10-27 MED ORDER — IOHEXOL 350 MG/ML SOLN
75.0000 mL | Freq: Once | INTRAVENOUS | Status: AC | PRN
  Administered 2023-10-27: 75 mL via INTRAVENOUS

## 2023-10-28 ENCOUNTER — Ambulatory Visit: Payer: Self-pay | Admitting: Medical

## 2023-10-28 NOTE — Progress Notes (Signed)
 Results sent through MyChart.  Hello Devin Erickson, see aortic findings which are stable from prior scan I believe

## 2023-11-01 ENCOUNTER — Ambulatory Visit (INDEPENDENT_AMBULATORY_CARE_PROVIDER_SITE_OTHER): Admitting: Neurology

## 2023-11-01 ENCOUNTER — Encounter: Payer: Self-pay | Admitting: Neurology

## 2023-11-01 VITALS — BP 143/94 | HR 73 | Ht 75.0 in | Wt 278.0 lb

## 2023-11-01 DIAGNOSIS — G6181 Chronic inflammatory demyelinating polyneuritis: Secondary | ICD-10-CM

## 2023-11-01 DIAGNOSIS — R200 Anesthesia of skin: Secondary | ICD-10-CM | POA: Diagnosis not present

## 2023-11-01 NOTE — Progress Notes (Signed)
 Follow-up Visit   Date: 11/01/2023    Devin Erickson MRN: [id number] DOB: 10-02-1970    Devin Erickson is a 53 y.o. right-handed African American male with hyperlipidemia and anxiety returning to the clinic for follow-up of polyradiculoneuropathy.  The patient was accompanied to the clinic by self.   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 He was unable to do IVIG due to time constraints and opted to start Cellcept .  Because of worsening symptoms, he decided to restart IVIG in January 2025. Cellcept  has been discontinued.  Clinically, right hand weakness is stable with no new weakness.  Today, he reports numbness and subjective weakness in the left leg.  - Continue IVIG 1g/kg every 4 weeks  - NCS/EMG of the left leg  2.   Cervical spondylosis with mild multilevel foraminal stenosis at C5-6 and C6-7 which would not cause the severity of his hand weakness.  Followed by pain management.   3.  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    Return to clinic in 4 months   --------------------------------------------- History of present illness: Starting around April 2023, he began noticing mild weakness in the arm especially when weight lifting, it was more effortful on the right side.  Over the summer, he noticed weakness in grip on the right hand which continued to progress to the point where he is unable to use his hand to crank his ignition, turning keys to entire his home, grasping objects, and using utensils.  He is dropping things frequently.  He has difficulty straightening the hand.  He denies weakness with raising the arm or bending at the elbow.  He has mild pain in the right shoulder, described as a nagging pain.  He  treated it with tylenol .  Occasionally, he has numbness over the tips of the fingers.     He denies similar symptoms in the left hand.  No numbness/tingling in the feet.  He denies any ongoing pain or neck pain.  MRI cervical spine was performed in July which shows cervical spondylosis and multilevel foraminal stenosis at right C3-C4, bilateral C4-5 and left C6-7. Additionally, there was note of edema signal in the C6 and C7 spinous process as well as interspinous ligaments at C5-6, C6-7, C7-T1, and T1-T2.     NCS/EMG performed in September shows diffuse prolonged latency, slowed motor conduction velocity, and reduced amplitude involving the median and ulnar nerves.  Needle EMG shows active fibrillation potentials in the FDI, ABP, EDC, and biceps muscles.  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 Labs indicate positive ANA.   UPDATE 03/25/2022:  He is here to discuss results of EMG which favors a polyradiculoneuropathy affecting both arms, worse on the right.  He reports having discomfort in the left shoulder, similar to what he experienced in the beginning on the right.  No numbness/tinging of the left arm.  His right hand remains unchanged and continues to have weakness.  He has difficulty with fine motor tasks.   UPDATE 06/01/2022:  He is here for follow-up visit and discuss LP results.  There has been no significant change with his hand weakness or tingling, which remains worse in the right hand.  No similar  symptoms in the legs or imbalance.  LP shows elevated protein of 116, normal white count and presence of oligoclonal bands.  IgG is normal.   UPDATE 09/15/2022:  He is here for follow-up visit.  Since his last visit, he continued to have additional monthly solumedrol and reports no significant change.  He is having mild tingling in the left hand and  has noticed some weakness.   He was evaluated by Dr. Dolphus in Rheumatology last week whose evaluation led to the diagnosis of limited systemic sclerosis.    UPDATE 12/29/2022:  He is here for follow-up.  He has not noticed any new weakness of the hand and feels that symptoms stabilized with slight improvement.  He is tolerating Cellcept  500mg  BID.  No new weakness, numbness/tingling.  He will be seeing rheumatology next week.  He has noticed that it takes longer for him to get over a viral illness now.  He has a URI several weeks ago and only now starting to feel better.   He reports having right shoulder stiffness which started a few weeks ago.  More recently, he began noticing progressive weakness in the right hand.  He is unable to hold items in the right hand, such as a fork.  He has had a few spells of right leg buckling.  No new tingling or numbness.   UPDATE 05/04/2023:  He is having more right shoulder pain, which is described as achy/pressure over the scapula.  Sleeping on the right side makes it worse.  He has tried tylenol  which provides some benefit.  No improvement with flexeril  which his PCP prescribed.  He has not started IVIG as it was recently approved, but is scheduled to have this on 1/28.  He feels that right hand is overall stable, weakness is worse in the evening.    UPDATE 08/02/2023:  He is here for follow-up visit.  He has been on IVIG and reports no improvement or worsening.  He is tolerating IVIG well.  Overall, he feels that right hand is stable.  Sometimes, he feels that his left leg is starting to feel different - no numbness, tingling, or weakness.  He is also seeing pain management because of ongoing right shoulder pain.  He takes Lyrica 150mg  daily which controls his pain much better.   He has noticed increased skin lesion and nodules involving the forehead and scalp.   UPDATE 11/01/2023:  He is here for 3 month follow-up visit.  There has been no significant change in the  weakness of his right hand.  He injured his right 4th finger and did not know how it occurred because of the numbness.  He continues to get IVIG every 4 weeks.  He is reports imbalance on the left side and weakness, as well as numbness over the left lower leg.  He has mild low back pain, no radicular pain.   He will be getting iron infusions this week.   Medications:  Current Outpatient Medications on File Prior to Visit  Medication Sig Dispense Refill   albuterol  (VENTOLIN  HFA) 108 (90 Base) MCG/ACT inhaler Inhale 2 puffs into the lungs every 6 (six) hours as needed for wheezing or shortness of breath. 8 g 0   B Complex Vitamins (VITAMIN B COMPLEX PO) Take by mouth.     carvedilol  (COREG ) 3.125 MG tablet Take 1 tablet (3.125 mg total) by mouth 2 (two) times daily with a meal. 180 tablet 2   cetirizine (ZYRTEC) 10 MG tablet Take  10 mg by mouth daily.     ipratropium (ATROVENT ) 0.06 % nasal spray Place 2 sprays into both nostrils 4 (four) times daily. 15 mL 2   JOURNAVX 50 MG TABS TAKE 2 TABLETS BY MOUTH ON DAY 1, THEN 1 TABLET BY MOUTH TWICE DAILY THEREAFTER     Multiple Vitamin (MULTIVITAMIN) tablet Take 1 tablet by mouth daily.     pregabalin (LYRICA) 150 MG capsule Take 150 mg by mouth daily.     QNASL  80 MCG/ACT AERS Place 1 spray into both nostrils 2 (two) times daily.     rosuvastatin  (CRESTOR ) 20 MG tablet TAKE 1 TABLET BY MOUTH EVERYDAY AT BEDTIME 90 tablet 2   No current facility-administered medications on file prior to visit.    Allergies: No Known Allergies  Vital Signs:  BP (!) 143/94   Pulse 73   Ht 6' 3 (1.905 m)   Wt 278 lb (126.1 kg)   SpO2 100%   BMI 34.75 kg/m   Neurological Exam: MENTAL STATUS including orientation to time, place, person, recent and remote memory, attention span and concentration, language, and fund of knowledge is normal.  Speech is not dysarthric.  CRANIAL NERVES:   Normal conjugate, extra-ocular eye movements in all directions of gaze.  No  ptosis.  Face is symmetric. Palate elevates symmetrically.  Tongue is midline.  MOTOR:  Right FDI, ADM, ABP, and medial forearm atrophy. He has muscle tenderness over the right scapula.   No fasciculations or abnormal movements.  No pronator drift.    Upper Extremity:  Right   Left  Deltoid  5/5    5/5   Biceps  5/5    5/5   Triceps  5/5    5/5   Wrist extensors  5/5    5/5   Wrist flexors  4/5    5/5   Finger extensors  2+/5    5/5   Finger flexors  3+/5    5/5   Dorsal interossei  1/5    5-/5   Abductor pollicis  3/5    5/5   Tone (Ashworth scale)  0   0    Lower Extremity:  Right   Left  Hip flexors  5/5    5/5   Knee flexors  5/5    5/5   Dorsiflexors  5/5    5/5   Plantarflexors  5/5    5/5   Toe extensors  5/5    5/5   Toe flexors  5/5    5/5   Tone (Ashworth scale)  0   0    MSRs:                                              Right        Left brachioradialis tr   1+  biceps tr   1+  triceps tr   1+  patellar 1+   1+  ankle jerk 1+   1+  Hoffman no   no  plantar response down   down    SENSORY:  Reduced temperature in the right hand, compared to the left.  Sensation intact in the legs.     COORDINATION/GAIT: Normal finger-to- nose-finger.  He is able to stand from sitting without using arms. Gait narrow based and stable.   Data: Labs 02/18/2022:  vitamin B12 880, folate >  23, CRP <1.0, ESR 16, TSH 1.65, ANCA neg, ACE 33, copper  110, SPEP with IFE no M protein, cryo neg, vitamin B1 7*, ANA positive, centromere antibody 1:640*  NCS/EMG of bilateral arms 03/24/2022 performed at [facility]: This is a complex study.  Findings are most suggestive of a subacute demyelinating and axonal polyradiculoneuropathy affecting the upper extremities, which is worse on the right.    NCS/EMG of the right arm 01/08/2022 performed at [facility]: EMG & NCV Findings:  Impression: The above electrodiagnostic study is ABNORMAL but is difficult to fully interpret.  The needle  EMG shows significant denervation but active motor unit potentials in multiple muscles which could be consistent with cervical stenosis but the MRI does not show any cervical stenosis.  This could be a brachial plexus type lesion although it does not seem to fit to a specific trauma although it is closest to an upper trunk brachial plexus lesion.  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    However the patient seems to have a concomitant median neuropathy at the wrist but also slowing of the ulnar nerve distal to the elbow and this would represent more of a demyelination type lesion on top of what was seen with the denervation potentials.     MRI cervical spine wo contrast 11/03/21: Intermittently motion degraded exam.  Indeterminate 18 mm T2 STIR hyperintense and T1 hypointense lesion within the T1 vertebral body. While this may reflect an atypical hemangioma, alternative etiologies (including osseous metastatic disease) cannot be excluded. Short-interval 2-3 month MRI follow-up without and with contrast is recommended to monitor this finding.   Marrow edema within the C6 and C7 spinous processes. Edema signal also present within the interspinous ligaments at C5-C6, C6-C7, C7-T1 and T1-T2. Findings are nonspecific, but possibly degenerative or posttraumatic in etiology. If there has been recent trauma, the interspinous ligament edema could reflect interspinous ligament injury and a cervical spine CT should be considered to exclude C6/C7 spinous process fractures. Additionally, attention to these sites recommended at MRI imaging follow-up.   Cervical spondylosis, as outlined. No more than mild relative spinal canal narrowing. Multilevel foraminal stenosis, as detailed and greatest on the right at C3-C4 (moderate), bilaterally at C4-C5 (mild-to-moderate) and on the left at C6-C7 (mild-to-moderate).   Nonspecific straightening of the expected cervical lordosis.  CSF 05/26/2022:  R0 W1 P116*  G56   ACE  5, IgG index 0.54, MBP pending, OCB present*, cytology negative    Thank you for allowing me to participate in patient's care.  If I can answer any additional questions, I would be pleased to do so.    Sincerely,    Rasheem Figiel K. Tobie, DO /

## 2023-11-01 NOTE — Patient Instructions (Addendum)
 Nerve testing of bilateral leg (left > right)  ELECTROMYOGRAM AND NERVE CONDUCTION STUDIES (EMG/NCS) INSTRUCTIONS  How to Prepare The neurologist conducting the EMG will need to know if you have certain medical conditions. Tell the neurologist and other EMG lab personnel if you: Have a pacemaker or any other electrical medical device Take blood-thinning medications Have hemophilia, a blood-clotting disorder that causes prolonged bleeding Bathing Take a shower or bath shortly before your exam in order to remove oils from your skin. Don't apply lotions or creams before the exam.  What to Expect You'll likely be asked to change into a hospital gown for the procedure and lie down on an examination table. The following explanations can help you understand what will happen during the exam.  Electrodes. The neurologist or a technician places surface electrodes at various locations on your skin depending on where you're experiencing symptoms. Or the neurologist may insert needle electrodes at different sites depending on your symptoms.  Sensations. The electrodes will at times transmit a tiny electrical current that you may feel as a twinge or spasm. The needle electrode may cause discomfort or pain that usually ends shortly after the needle is removed. If you are concerned about discomfort or pain, you may want to talk to the neurologist about taking a short break during the exam.  Instructions. During the needle EMG, the neurologist will assess whether there is any spontaneous electrical activity when the muscle is at rest - activity that isn't present in healthy muscle tissue - and the degree of activity when you slightly contract the muscle.  He or she will give you instructions on resting and contracting a muscle at appropriate times. Depending on what muscles and nerves the neurologist is examining, he or she may ask you to change positions during the exam.  After your EMG You may experience some  temporary, minor bruising where the needle electrode was inserted into your muscle. This bruising should fade within several days. If it persists, contact your primary care doctor.

## 2023-11-02 NOTE — Progress Notes (Signed)
 Office Visit Note  Patient: Devin Erickson             Date of Birth: 12/08/1970           MRN: [id number]             PCP: Bulah Alm RAMAN, PA-C Referring: Bulah Alm RAMAN, PA-C Visit Date: 11/16/2023 Occupation: @GUAROCC @  Subjective:  Generalized pain  History of Present Illness: Devin Erickson is a 53 y.o. male with limited cyst Tamica sclerosis, Raynaud's, CIDP.  He returns today after his last visit in January 2025.  He continues to have tingling in his hands and also discoloration in his hands.  States his hands and feet continue to stay cold.  He notices skin tightness only on his face.  Patient was evaluated by Dr. Tobie on November 01, 2023 for CIDP.  According to her note IVIG was discontinued due to time constraints and he opted CellCept .  IVIG was restarted in January 2025 due to worsening of symptoms.  CellCept  was discontinued.  His right hand weakness has been stable.  He will be getting IVIG 1 g/kg every 4 weeks.  His last IVIG was about 6 weeks ago.  He is having difficulty getting infusions due to insurance coverage.  Dr. Tobie is planning EMG and nerve conduction velocity of left lower extremity due to subjective weakness.  He continues to go to pain management for cervical spondylosis and foraminal stenosis.  He continues to be on gabapentin  as needed.  He is off tizanidine .  Patient was evaluated by Dr. Theophilus on Sep 08, 2023.  Dr. Theophilus discussed scheduling PFTs and repeating CT scan.  He states he was diagnosed with iron  deficiency anemia and was receiving iron  infusions.  Last iron  infusion was last Friday.  Activities of Daily Living:  Patient reports morning stiffness for 0 minute.   Patient Reports nocturnal pain.  Difficulty dressing/grooming: Reports Difficulty climbing stairs: Denies Difficulty getting out of chair: Denies Difficulty using hands for taps, buttons, cutlery, and/or writing: Reports  Review of Systems  Constitutional:  Positive for  fatigue.  HENT:  Negative for mouth sores and mouth dryness.   Eyes:  Negative for dryness.  Respiratory:  Positive for shortness of breath.   Cardiovascular:  Negative for chest pain and palpitations.  Gastrointestinal:  Negative for blood in stool, constipation and diarrhea.  Endocrine: Negative for increased urination.  Genitourinary:  Negative for involuntary urination.  Musculoskeletal:  Positive for joint pain, gait problem, joint pain, myalgias, muscle weakness and myalgias. Negative for joint swelling, morning stiffness and muscle tenderness.  Skin:  Negative for color change, rash, hair loss and sensitivity to sunlight.  Allergic/Immunologic: Positive for susceptible to infections.  Neurological:  Positive for dizziness and headaches.  Hematological:  Negative for swollen glands.  Psychiatric/Behavioral:  Positive for depressed mood and sleep disturbance. The patient is nervous/anxious.     PMFS History:  Patient Active Problem List   Diagnosis Date Noted   IDA (iron  deficiency anemia) 10/20/2023   Aneurysm of ascending aorta without rupture (HCC) 06/30/2023   Essential hypertension, benign 06/30/2023   Limited systemic sclerosis (HCC) 05/19/2023   Sjogren's disease (HCC) 05/19/2023   DDD (degenerative disc disease), cervical 05/19/2023   Nosebleed 04/06/2023   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   Neuropathy, arm, right 01/20/2022   Radiculopathy, cervical region 10/15/2021   Left arm pain 06/18/2021   Upper back pain 06/18/2021   Decreased ROM of neck 06/18/2021   Screening  for heart disease 02/05/2021   Screening for prostate cancer 02/05/2021   Sleep disturbance 02/05/2021   Chronic right-sided thoracic back pain 05/08/2020   Fatigue 07/10/2019   Raynaud's phenomenon without gangrene 07/10/2019   Erectile dysfunction 07/10/2019   At high risk for bleeding 07/10/2019   ANA positive 07/10/2019   Elevated liver function tests  03/08/2019   Need for influenza vaccination 02/16/2019   Vaccine counseling 02/16/2019   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   Chronic left shoulder pain 05/02/2018   Benign paroxysmal positional vertigo due to bilateral vestibular disorder 06/24/2017   Encounter for health maintenance examination in adult 07/22/2016   Chronic pain of right knee 07/22/2016   Atopic dermatitis 12/12/2015   Tension headache, chronic 10/04/2015   Cough variant asthma vs UACS 09/04/2015   Epistaxis 08/01/2014   Rhinitis, allergic 08/01/2014   Hyperlipidemia 08/01/2014   OSA (obstructive sleep apnea) 08/01/2014    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   History of sleep apnea 04/2012   per overnight screen/oximetry    Obesity    Thalassemia minor    Wears glasses     Family History  Problem Relation Age of Onset   Cancer Mother 109       colon   Hypertension Mother    Dementia Father    Cancer Father        pancreas   Healthy Brother    Healthy Son    Heart disease Neg Hx    Lung disease Neg Hx    Past Surgical History:  Procedure Laterality Date   COLONOSCOPY  04/27/2010   [facility], normal per pt   COLONOSCOPY  08/2019   Dr. 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  Jehoviah Witness.  01/2021         Right Handed    Lives in a one story home    Occasionally caffeine    Immunization History  Administered Date(s) Administered   Fluzone Influenza virus vaccine,trivalent (IIV3), split virus 03/04/2010, 02/15/2012   Influenza Split 02/07/2009   Influenza, Mdck, Trivalent,PF 6+ MOS(egg free) 01/12/2023    Influenza,inj,Quad PF,6+ Mos 02/15/2012, 03/10/2013, 06/14/2014, 02/01/2015, 02/08/2017, 03/10/2018, 02/16/2019, 04/05/2020, 01/06/2021, 01/29/2022   Influenza-Unspecified 05/15/2016   Moderna Sars-Covid-2 Vaccination 07/13/2019, 08/10/2019   PFIZER Comirnaty(Gray Top)Covid-19 Tri-Sucrose Vaccine 08/23/2020   PFIZER(Purple Top)SARS-COV-2 Vaccination 03/02/2020   Pfizer Covid-19 Vaccine Bivalent Booster 83yrs & up 01/06/2021   Pfizer(Comirnaty)Fall Seasonal Vaccine 12 years and older 01/12/2023   Tdap 02/15/2012, 08/10/2012   Unspecified SARS-COV-2 Vaccination 01/29/2022   Zoster Recombinant(Shingrix) 06/08/2020, 08/07/2020     Objective: Vital Signs: BP 126/82 (BP Location: Left Arm, Patient Position: Sitting, Cuff Size: Large)   Pulse 97   Resp 14   Ht 6' 3 (1.905 m)   Wt 276 lb (125.2 kg)   BMI 34.50 kg/m    Physical Exam Vitals and nursing note reviewed.  Constitutional:      Appearance: He is well-developed.  HENT:     Head: Normocephalic and atraumatic.  Eyes:  Conjunctiva/sclera: Conjunctivae normal.     Pupils: Pupils are equal, round, and reactive to light.  Cardiovascular:     Rate and Rhythm: Normal rate and regular rhythm.     Heart sounds: Normal heart sounds.  Pulmonary:     Effort: Pulmonary effort is normal.     Breath sounds: Normal breath sounds.  Abdominal:     General: Bowel sounds are normal.     Palpations: Abdomen is soft.  Musculoskeletal:     Cervical back: Normal range of motion and neck supple.  Skin:    General: Skin is warm and dry.     Capillary Refill: Capillary refill takes less than 2 seconds.     Comments: Sclerodactyly involving bilateral hands distal to PIPs.  Telangiectasias on the palmar aspect of both hands.  Decreased capillary refill.  Nailbed capillary dropout.  Neurological:     Mental Status: He is alert and oriented to person, place, and time.  Psychiatric:        Behavior: Behavior normal.      Musculoskeletal Exam:  He had limited painful range of motion of the cervical spine.  Right shoulder joint abduction was limited to about 140 degrees bilaterally.  He had limited forward flexion and internal rotation.  Left shoulder joint was in full range of motion.  Elbow joints were in good range of motion.  There was no synovitis over MCPs PIPs or DIPs.  Hip joints and knee joints were in good range of motion.  There was no tenderness over ankles or MTPs.  CDAI Exam: CDAI Score: -- Patient Global: --; Provider Global: -- Swollen: --; Tender: -- Joint Exam 11/16/2023   No joint exam has been documented for this visit   There is currently no information documented on the homunculus. Go to the Rheumatology activity and complete the homunculus joint exam.  Investigation: No additional findings.  Imaging: CT ANGIO CHEST AORTA W/ & OR WO/CM & GATING ([place] ONLY) Result Date: 10/28/2023 CLINICAL DATA:  53 year old male with suspected ascending thoracic aortic aneurysm on noncontrast chest CT 09/22/2023. Subsequent encounter. EXAM: CT ANGIOGRAPHY CHEST WITHOUT AND WITH CONTRAST TECHNIQUE: Multidetector CT imaging of the chest was performed using the standard protocol BOTH PRIOR TO AND during bolus administration of intravenous contrast. Multiplanar CT image reconstructions and MIPs were obtained to evaluate the vascular anatomy. RADIATION DOSE REDUCTION: This exam was performed according to the departmental dose-optimization program which includes automated exposure control, adjustment of the mA and/or kV according to patient size and/or use of iterative reconstruction technique. CONTRAST:  75mL OMNIPAQUE  IOHEXOL  350 MG/ML SOLN COMPARISON:  High-resolution chest CT 09/22/2023. FINDINGS: Cardiovascular: Aortic contrast timing, no contrast in the pulmonary arteries system. Calcified coronary artery atherosclerosis evident on series 301, image 71 noncontrast image. Midportion of the ascending thoracic aorta measures 41-42 mm  diameter on series 304, image 254 and series 603, image 119. Negative for thoracic aortic dissection. Negative for saccular aortic aneurysm. Mild thoracic aortic atherosclerosis. No pericardial effusion. Mediastinum/Nodes: Negative. No mediastinal mass or lymphadenopathy. Lungs/Pleura: Low lung volumes with mild generalized crowding of lung markings. Major airways are patent. No pleural effusion, evidence of active pulmonary inflammation, or suspicious lung nodule. Upper Abdomen: Heterogeneous noncontrast and early arterial postcontrast liver parenchyma, more apparent than on the noncontrast CT last month but nonspecific. 2-3 cm areas of vague early arterial phase enhancement in the right lobe (such as series 603, image 71), but no discrete hypodense liver lesion. Diminutive gallbladder. Negative visible pancreas, spleen, adrenal  glands, kidneys, and bowel in the upper abdomen. Musculoskeletal: Mild generalized bony sclerosis throughout the visible skeleton. No destructive or suspicious osseous lesion identified. Review of the MIP images confirms the above findings. IMPRESSION: 1. Ascending thoracic aorta fusiform aneurysmal enlargement measuring 41-42 mm diameter. Recommend annual imaging followup by CTA or MRA. This recommendation follows 2010 ACCF/AHA/AATS/ACR/ASA/SCA/SCAI/SIR/STS/SVM Guidelines for the Diagnosis and Management of Patients with Thoracic Aortic Disease. Circulation. 2010; 121: Z733-z630. Aortic aneurysm NOS (ICD10-I71.9) 2. Heterogeneous but nonspecific liver parenchyma and enhancement. Recommend routine follow-up Abdomen MRI (Liver protocol and with contrast) to further characterize. 3. Calcified coronary artery atherosclerosis. Electronically Signed   By: VEAR Hurst M.D.   On: 10/28/2023 07:14    Recent Labs: Lab Results  Component Value Date   WBC 4.9 10/20/2023   HGB 9.8 (L) 10/20/2023   PLT 181 10/20/2023   NA 140 08/12/2023   K 4.0 08/12/2023   CL 109 08/12/2023   CO2 26 08/12/2023    GLUCOSE 95 08/12/2023   BUN 23 (H) 08/12/2023   CREATININE 1.11 08/12/2023   BILITOT 0.5 08/12/2023   ALKPHOS 50 08/12/2023   AST 29 08/12/2023   ALT 22 08/12/2023   PROT 8.0 08/12/2023   ALBUMIN 4.3 08/12/2023   CALCIUM  9.4 08/12/2023   GFRAA 84 02/16/2019    Speciality Comments: No specialty comments available.  Procedures:  No procedures performed Allergies: Patient has no known allergies.   Assessment / Plan:     Visit Diagnoses: Limited systemic sclerosis (HCC) - Sclerodactyly, Raynaud's, telangiectasia, nailbed capillary changes, ANA 1: 640 centromere: He continues to have Raynaud's phenomenon.  Nailbed capillary changes and no sclerodactyly was noted.  Telangiectasia was noted on the palmar aspect of his hands.  He had high-resolution CT scan of the chest on Sep 22, 2023 which was negative for ILD.  An ascending aortic aneurysm 4 cm was noted.  Aortic atherosclerosis was noted.  He also had a CTA.  Raynaud's phenomenon without gangrene-he continues to have Raynaud's phenomenon.  Keeping core temperature warm and warm clothing was discussed.  Elevated CK - Initial CK was elevated at 1719 which responded to prednisone  prescribed by Dr. Tobie.  12/12/2022 CK227, myositis panel negative, HMG CR antibody negative.  Sjogren's syndrome with other organ involvement (HCC) - Heterogeneous appearance of parotid glands was noted on the MRI of the cervical spine.  SSA negative, SSB negative.  He continues to have dry mouth and dry eyes.  Over-the-counter products were discussed.  DDD (degenerative disc disease), cervical - mild multilevel foraminal stenosis at C5-6 and C6-7.  He continues to have limited range of motion of the cervical spine.  He has been followed at pain clinic.  CIDP (chronic inflammatory demyelinating polyneuropathy) (HCC) -he is on IVIG by Dr. Tobie.  CellCept  discontinued.  Patient states his last IVIG was 6 weeks ago.  He is having difficulty getting IVIG through  insurance.  Other iron  deficiency anemia-patient has been followed by Dr. Loretha.  He has been getting IV iron  infusions.  Alopecia totalis - Diagnosed 2017  Other atopic dermatitis  Mixed hyperlipidemia  Cough variant asthma vs UACS  Allergic rhinitis, unspecified seasonality, unspecified trigger  Orders: No orders of the defined types were placed in this encounter.  No orders of the defined types were placed in this encounter.    Follow-Up Instructions: Return in about 6 months (around 05/18/2024) for Scleroderma.   Maya Nash, MD  Note - This record has been created using Animal nutritionist.  Chart creation  errors have been sought, but may not always  have been located. Such creation errors do not reflect on  the standard of medical care.

## 2023-11-05 ENCOUNTER — Ambulatory Visit (INDEPENDENT_AMBULATORY_CARE_PROVIDER_SITE_OTHER)

## 2023-11-05 VITALS — BP 143/91 | HR 83 | Temp 98.2°F | Resp 18 | Ht 75.0 in | Wt 278.4 lb

## 2023-11-05 DIAGNOSIS — D509 Iron deficiency anemia, unspecified: Secondary | ICD-10-CM | POA: Diagnosis not present

## 2023-11-05 MED ORDER — SODIUM CHLORIDE 0.9 % IV SOLN
300.0000 mg | Freq: Once | INTRAVENOUS | Status: AC
Start: 2023-11-05 — End: 2023-11-05
  Administered 2023-11-05: 300 mg via INTRAVENOUS
  Filled 2023-11-05: qty 15

## 2023-11-05 NOTE — Addendum Note (Signed)
 Addended by: RANNIE LEITA BRAVO on: 11/05/2023 04:21 PM   Modules accepted: Orders

## 2023-11-05 NOTE — Progress Notes (Signed)
 Diagnosis: Iron  Deficiency Anemia  Provider:  Praveen Mannam MD  Procedure: IV Infusion  IV Type: Peripheral, IV Location: L Forearm  Venofer  (Iron  Sucrose), Dose: 300 mg  Infusion Start Time: 1334  Infusion Stop Time: 1512  Post Infusion IV Care: Observation period completed and Peripheral IV Discontinued  Discharge: Condition: Good, Destination: Home . AVS Declined  Performed by:  Leita FORBES Miles, LPN

## 2023-11-08 ENCOUNTER — Telehealth: Payer: Self-pay | Admitting: Neurology

## 2023-11-08 NOTE — Telephone Encounter (Signed)
 Pt. Says Insurance sent info for infusions, wanted update on auth

## 2023-11-10 NOTE — Telephone Encounter (Signed)
 Called and let know I will look up the paper work, Dr. Tobie will be here tomorrow and I will talk to her. He understood.

## 2023-11-11 NOTE — Telephone Encounter (Signed)
 Email sent to [place] with [facility] regarding his IVIG PA.  Awaiting reply.

## 2023-11-12 ENCOUNTER — Ambulatory Visit

## 2023-11-12 VITALS — BP 139/94 | HR 82 | Temp 98.0°F | Resp 14 | Ht 75.0 in | Wt 277.0 lb

## 2023-11-12 DIAGNOSIS — D509 Iron deficiency anemia, unspecified: Secondary | ICD-10-CM

## 2023-11-12 MED ORDER — SODIUM CHLORIDE 0.9 % IV SOLN
300.0000 mg | Freq: Once | INTRAVENOUS | Status: AC
  Administered 2023-11-12: 300 mg via INTRAVENOUS
  Filled 2023-11-12: qty 15

## 2023-11-12 NOTE — Progress Notes (Signed)
 Diagnosis: Iron  Deficiency Anemia  Provider:  Mannam, Praveen MD  Procedure: IV Infusion  IV Type: Peripheral, IV Location: L Hand  Venofer  (Iron  Sucrose), Dose: 300 mg  Infusion Start Time: 1409 pm  Infusion Stop Time: 1550 pm 1409 pm Post Infusion IV Care: Observation period completed and Peripheral IV Discontinued  Discharge: Condition: Good, Destination: Home . AVS Declined  Performed by:  Trudy Lamarr LABOR, RN

## 2023-11-12 NOTE — Telephone Encounter (Signed)
 Message sent to patient on Mychart to update on status

## 2023-11-16 ENCOUNTER — Ambulatory Visit: Payer: Managed Care, Other (non HMO) | Attending: Rheumatology | Admitting: Rheumatology

## 2023-11-16 ENCOUNTER — Encounter: Payer: Self-pay | Admitting: Rheumatology

## 2023-11-16 VITALS — BP 126/82 | HR 97 | Resp 14 | Ht 75.0 in | Wt 276.0 lb

## 2023-11-16 DIAGNOSIS — E782 Mixed hyperlipidemia: Secondary | ICD-10-CM

## 2023-11-16 DIAGNOSIS — I7300 Raynaud's syndrome without gangrene: Secondary | ICD-10-CM

## 2023-11-16 DIAGNOSIS — M3509 Sicca syndrome with other organ involvement: Secondary | ICD-10-CM

## 2023-11-16 DIAGNOSIS — J45991 Cough variant asthma: Secondary | ICD-10-CM

## 2023-11-16 DIAGNOSIS — G6181 Chronic inflammatory demyelinating polyneuritis: Secondary | ICD-10-CM

## 2023-11-16 DIAGNOSIS — D508 Other iron deficiency anemias: Secondary | ICD-10-CM

## 2023-11-16 DIAGNOSIS — M5030 Other cervical disc degeneration, unspecified cervical region: Secondary | ICD-10-CM

## 2023-11-16 DIAGNOSIS — J309 Allergic rhinitis, unspecified: Secondary | ICD-10-CM

## 2023-11-16 DIAGNOSIS — R748 Abnormal levels of other serum enzymes: Secondary | ICD-10-CM

## 2023-11-16 DIAGNOSIS — L630 Alopecia (capitis) totalis: Secondary | ICD-10-CM

## 2023-11-16 DIAGNOSIS — M349 Systemic sclerosis, unspecified: Secondary | ICD-10-CM

## 2023-11-16 DIAGNOSIS — L2089 Other atopic dermatitis: Secondary | ICD-10-CM

## 2023-11-19 ENCOUNTER — Ambulatory Visit

## 2023-11-19 VITALS — BP 146/96 | HR 70 | Temp 98.2°F | Resp 16 | Ht 75.0 in | Wt 275.8 lb

## 2023-11-19 DIAGNOSIS — D509 Iron deficiency anemia, unspecified: Secondary | ICD-10-CM | POA: Diagnosis not present

## 2023-11-19 MED ORDER — SODIUM CHLORIDE 0.9 % IV SOLN
300.0000 mg | Freq: Once | INTRAVENOUS | Status: AC
  Administered 2023-11-19: 300 mg via INTRAVENOUS
  Filled 2023-11-19: qty 15

## 2023-11-19 NOTE — Progress Notes (Signed)
 Diagnosis: Iron  Deficiency Anemia  Provider:  Praveen Mannam MD  Procedure: IV Infusion  IV Type: Peripheral, IV Location: R Hand  Venofer  (Iron  Sucrose), Dose: 300 mg  Infusion Start Time: 1343  Infusion Stop Time: 1527  Post Infusion IV Care: Observation period completed and Peripheral IV Discontinued  Discharge: Condition: Good, Destination: Home . AVS Declined  Performed by:  Leita FORBES Miles, LPN

## 2023-12-10 ENCOUNTER — Encounter: Admitting: Neurology

## 2023-12-20 ENCOUNTER — Ambulatory Visit: Admitting: Pulmonary Disease

## 2023-12-20 ENCOUNTER — Encounter: Payer: Self-pay | Admitting: Pulmonary Disease

## 2023-12-20 ENCOUNTER — Ambulatory Visit (INDEPENDENT_AMBULATORY_CARE_PROVIDER_SITE_OTHER): Admitting: Pulmonary Disease

## 2023-12-20 VITALS — BP 148/92 | HR 81 | Temp 98.1°F | Ht 74.0 in | Wt 277.0 lb

## 2023-12-20 DIAGNOSIS — M3500 Sicca syndrome, unspecified: Secondary | ICD-10-CM

## 2023-12-20 DIAGNOSIS — M349 Systemic sclerosis, unspecified: Secondary | ICD-10-CM

## 2023-12-20 DIAGNOSIS — G6181 Chronic inflammatory demyelinating polyneuritis: Secondary | ICD-10-CM

## 2023-12-20 DIAGNOSIS — D509 Iron deficiency anemia, unspecified: Secondary | ICD-10-CM

## 2023-12-20 LAB — PULMONARY FUNCTION TEST
DL/VA % pred: 158 %
DL/VA: 6.74 ml/min/mmHg/L
DLCO cor % pred: 121 %
DLCO cor: 39.18 ml/min/mmHg
DLCO unc % pred: 66 %
DLCO unc: 21.49 ml/min/mmHg
FEF 25-75 Post: 3.09 L/s
FEF 25-75 Pre: 2.75 L/s
FEF2575-%Change-Post: 12 %
FEF2575-%Pred-Post: 85 %
FEF2575-%Pred-Pre: 76 %
FEV1-%Change-Post: 2 %
FEV1-%Pred-Post: 69 %
FEV1-%Pred-Pre: 67 %
FEV1-Post: 2.98 L
FEV1-Pre: 2.9 L
FEV1FVC-%Change-Post: 3 %
FEV1FVC-%Pred-Pre: 103 %
FEV6-%Change-Post: -1 %
FEV6-%Pred-Post: 67 %
FEV6-%Pred-Pre: 67 %
FEV6-Post: 3.62 L
FEV6-Pre: 3.66 L
FEV6FVC-%Change-Post: 0 %
FEV6FVC-%Pred-Post: 104 %
FEV6FVC-%Pred-Pre: 103 %
FVC-%Change-Post: -1 %
FVC-%Pred-Post: 64 %
FVC-%Pred-Pre: 65 %
FVC-Post: 3.62 L
FVC-Pre: 3.68 L
Post FEV1/FVC ratio: 82 %
Post FEV6/FVC ratio: 100 %
Pre FEV1/FVC ratio: 79 %
Pre FEV6/FVC Ratio: 100 %
RV % pred: 131 %
RV: 3.1 L
TLC % pred: 90 %
TLC: 7.01 L

## 2023-12-20 NOTE — Progress Notes (Signed)
 Full PFT performed today.

## 2023-12-20 NOTE — Progress Notes (Addendum)
 Devin Erickson    [id number]    1970-11-30  Primary Care Physician:Tysinger, Alm RAMAN, PA-C  Referring Physician: Phuc Kluttz, MD [address] [place],  [place] [address]  Chief complaint: Follow-up for interstitial lung disease evaluation History of  HPI: 53 y.o. who  has a past medical history of Anxiety, Depression (07/2017), Eczema, H/O exercise stress test (2009), Hair loss, History of sleep apnea (04/2012), Obesity, Thalassemia minor, and Wears glasses.  Discussed the use of AI scribe software for clinical note transcription with the patient, who gave verbal consent to proceed.  History of Present Illness Devin Erickson is a 53 year old male with systemic sclerosis who presents for evaluation of potential lung involvement. He was referred by Dr. Dolphus, his rheumatologist, for evaluation of potential lung involvement due to limited systemic sclerosis.  He has a history of limited systemic sclerosis, diagnosed over a year ago, and is currently being evaluated for potential lung involvement. A CT scan from June of last year was performed to assess lung involvement. No current lung symptoms are present, and there is no known exposure to environmental factors such as mold or feather pillows.  He has chronic inflammatory demyelinating polyneuropathy (CIDP) and receives monthly IVIG infusions. Previously, he was on IVIG which was changed to CellCept  earlier this year.  However CellCept , which was discontinued around April due to worsening symptoms and he is back on IVIG.  His neuropathy symptoms include weakness and pale discoloration of his fingers, which sometimes turn blue, and he experiences 'little dots' in them.  Follows with Dr. Tobie for neurology  Previously evaluated by [facility] rheumatology and there is a consideration of Sjogren's syndrome in relation to his immune system issues but does not need specific therapy for Sjogren's and limited specific  sclerosis at present  Interim history Devin Erickson is a 53 year old male with systemic sclerosis who presents for a pulmonary evaluation.  Pulmonary function and respiratory symptoms - No current dyspnea or respiratory distress - Recent pulmonary function testing demonstrated reduced diffusion capacity - High-resolution CT scan of the chest in June 2025 was normal  Systemic sclerosis and associated manifestations - Diagnosed with systemic sclerosis - History of Raynaud's phenomenon  Neuromuscular disease and immunotherapy - Receiving monthly intravenous immunoglobulin (IVIG) infusions for chronic inflammatory demyelinating polyneuropathy (CIDP) since January 2025 - Previously treated with Cellcept , now resumed IVIG therapy - Neurologist manages immunotherapy regimen  Hematologic findings affecting pulmonary function - History of iron  deficiency anemia contributing to reduced lung diffusion capacity  Cardiac evaluation - Echocardiogram performed in 2014 or 2024 was normal  Relevant pulmonary history Pets: Has pets including a couple of cats and a dog Occupation:He works in a [employer] room [employer] Exposures: No h/o chemo/XRT/amiodarone/macrodantin/MTX  No exposure to asbestos, silica or other organic allergens  Smoking history: Non-smoker Travel history: Originally from [place] .  Moved to [place]  around 2000.  No significant recent travel Relevant family history:No family history of lung disease.  Outpatient Encounter Medications as of 12/20/2023  Medication Sig   albuterol  (VENTOLIN  HFA) 108 (90 Base) MCG/ACT inhaler Inhale 2 puffs into the lungs every 6 (six) hours as needed for wheezing or shortness of breath.   B Complex Vitamins (VITAMIN B COMPLEX PO) Take by mouth.   carvedilol  (COREG ) 3.125 MG tablet Take 1 tablet (3.125 mg total) by mouth 2 (two) times daily with a meal.   cetirizine (ZYRTEC) 10 MG tablet Take  10 mg by mouth daily.    ipratropium (ATROVENT ) 0.06 % nasal spray Place 2 sprays into both nostrils 4 (four) times daily. (Patient taking differently: Place 2 sprays into both nostrils 4 (four) times daily. PRN)   Multiple Vitamin (MULTIVITAMIN) tablet Take 1 tablet by mouth daily.   pregabalin (LYRICA) 150 MG capsule Take 150 mg by mouth daily.   rosuvastatin  (CRESTOR ) 20 MG tablet TAKE 1 TABLET BY MOUTH EVERYDAY AT BEDTIME   JOURNAVX 50 MG TABS TAKE 2 TABLETS BY MOUTH ON DAY 1, THEN 1 TABLET BY MOUTH TWICE DAILY THEREAFTER (Patient not taking: Reported on 12/20/2023)   QNASL  80 MCG/ACT AERS Place 1 spray into both nostrils 2 (two) times daily. (Patient not taking: Reported on 12/20/2023)   No facility-administered encounter medications on file as of 12/20/2023.   Vitals:   12/20/23 1605  BP: (!) 148/92  Pulse: 81  Temp: 98.1 F (36.7 C)  Height: 6' 2 (1.88 m)  Weight: 277 lb (125.6 kg)  SpO2: 95%  TempSrc: Oral  BMI (Calculated): 35.55     Physical Exam GEN: No acute distress CV: Regular rate and rhythm no murmurs LUNGS: Clear to auscultation bilaterally normal respiratory effort SKIN JOINTS: Warm and dry no rash    Data Reviewed: Imaging: High resolution CT 10/18/2022-no evidence of interstitial lung disease, coronary atherosclerosis, 4.2 cm ascending thoracic aorta.  High resolution CT 09/22/2023-no evidence of interstitial lung disease, minimal air trapping, coronary artery calcification.  4.0 ascending aortic aneurysm  I have reviewed the images personally.  PFTs: Spirometry 09/04/2015 FVC 4.02, FEV1 3.26 [4+], F/F81 Normal spirometry  12/20/2023 FVC 3.62 [64%], FEV1 2.98 [69%], F/F82, TLC 7.01 [90%], DLCOcorr 39.18 [121%] No airway obstruction, normal lung volumes The diffusion capacity is elevated. Clinical significance of this is unclear, but high diffusion can be seen in asthma, obesity.  Labs:  Cardiac: Echocardiogram 11/24/2022-LVEF 60 to 65%, normal RV systolic size and function.   Normal PA systolic pressure.  Estimated RVSP 25.5  Assessment & Plan Systemic sclerosis (scleroderma) with pulmonary involvement screening Systemic sclerosis with potential pulmonary involvement. Previous high-resolution CT scan in June showed no abnormalities. Lung function tests show reduced diffusion capacity, likely due to anemia.  Noted to have baseline hemoglobin of 9.8.  Corrected diffusion capacity is elevated. Need to rule out pulmonary hypertension due to scleroderma's potential impact on the right side of the heart. - Order echocardiogram to assess for pulmonary hypertension - If echocardiogram is normal, follow up in one year - If both echocardiogram and lung function tests remain normal, consider annual or biennial checks of lung and heart function  Iron  deficiency anemia affecting pulmonary function Iron  deficiency anemia contributing to reduced diffusion capacity in lung function tests. Corrected diffusion capacity is normal when accounting for anemia. He is already on iron  infusions.  Chronic inflammatory demyelinating polyneuropathy (CIDP) CIDP with ongoing treatment. Currently on monthly IVIG infusions.  Previously treated with CellCept .   Recommendations: Echocardiogram Follow-up in 1 year  Lonna Coder MD [facility] 12/20/2023, 4:14 PM  CC: Axton Cihlar, MD

## 2023-12-20 NOTE — Patient Instructions (Signed)
 Full PFT performed today.

## 2023-12-20 NOTE — Patient Instructions (Signed)
  VISIT SUMMARY: You came in today for a pulmonary evaluation due to your systemic sclerosis. We reviewed your recent pulmonary function tests and high-resolution CT scan, which showed no current lung abnormalities but did indicate reduced diffusion capacity likely due to your iron  deficiency anemia. We also discussed your ongoing treatment for chronic inflammatory demyelinating polyneuropathy (CIDP) and your history of Raynaud's phenomenon.  YOUR PLAN: -SYSTEMIC SCLEROSIS WITH PULMONARY INVOLVEMENT SCREENING: Systemic sclerosis, also known as scleroderma, is a condition that can affect the skin and internal organs, including the lungs. Your recent tests showed no lung abnormalities, but we need to rule out pulmonary hypertension, which is high blood pressure in the lungs that can affect the right side of the heart. We will order an echocardiogram to check for this. If the echocardiogram is normal, we will follow up in one year. If both the echocardiogram and lung function tests remain normal, we will consider annual or biennial checks of your lung and heart function.  -IRON  DEFICIENCY ANEMIA AFFECTING PULMONARY FUNCTION: Iron  deficiency anemia is a condition where your body lacks enough iron  to produce healthy red blood cells, which can affect your lung function. Your lung function tests showed reduced diffusion capacity, but this is normal when corrected for your anemia. We will continue to monitor this condition.  INSTRUCTIONS: Please schedule an echocardiogram to assess for pulmonary hypertension. If the results are normal, we will follow up in one year. If both the echocardiogram and lung function tests remain normal, we will consider annual or biennial checks of your lung and heart function.

## 2023-12-22 ENCOUNTER — Telehealth: Payer: Self-pay | Admitting: Hematology and Oncology

## 2023-12-22 NOTE — Telephone Encounter (Signed)
 I LVM asking Devin Erickson to return my call if the re-scheduled appointment does not work for him on 9/29.

## 2023-12-28 ENCOUNTER — Telehealth: Payer: Self-pay

## 2023-12-28 NOTE — Telephone Encounter (Signed)
 Pt called to make changes to his appt 9/29 stating he needs the latest appt. Advised we would have to r/s to another day d/t full schedule that day. He accepted appt 10/1 at 1445 lab and 1515 appt with MD.

## 2024-01-12 ENCOUNTER — Ambulatory Visit (INDEPENDENT_AMBULATORY_CARE_PROVIDER_SITE_OTHER): Admitting: Podiatry

## 2024-01-12 DIAGNOSIS — M7751 Other enthesopathy of right foot: Secondary | ICD-10-CM

## 2024-01-12 MED ORDER — TRIAMCINOLONE ACETONIDE 10 MG/ML IJ SUSP
10.0000 mg | Freq: Once | INTRAMUSCULAR | Status: AC
  Administered 2024-01-12: 10 mg via INTRA_ARTICULAR

## 2024-01-12 NOTE — Progress Notes (Signed)
 Subjective:   Patient ID: Devin Erickson, male   DOB: 53 y.o.   MRN: [id number]   HPI Patient states the  relief did not last as long in the right foot this time as it did previously and its become sore again   ROS      Objective:  Physical Exam  Neurovascular status intact with inflammation around the fifth MPJ right fluid buildup lesion formation     Assessment:  Chronic inflammation with fluid around the fifth MPJ right and porokeratotic lesion     Plan:  H&P reviewed sterile prep reinjected and debrided lesion may require more aggressive treatment in future

## 2024-01-14 ENCOUNTER — Telehealth: Payer: Self-pay | Admitting: Neurology

## 2024-01-14 NOTE — Telephone Encounter (Signed)
 Received paperwork, signature received and ppw has been faxed.

## 2024-01-14 NOTE — Telephone Encounter (Signed)
 Tetsuo from [facility] called in this morning and stated that they fax over a request for an earlier infusion due to pt is going out town. Pt wants the infusion on 01-22-24 which is currently 7 days earlier than the scheduled infusion.

## 2024-01-17 ENCOUNTER — Ambulatory Visit (HOSPITAL_COMMUNITY)
Admission: RE | Admit: 2024-01-17 | Discharge: 2024-01-17 | Disposition: A | Discharge status: Home / Self Care | Source: Ambulatory Visit | Attending: Surgery | Admitting: Surgery

## 2024-01-17 DIAGNOSIS — I10 Essential (primary) hypertension: Secondary | ICD-10-CM

## 2024-01-17 DIAGNOSIS — M3500 Sicca syndrome, unspecified: Secondary | ICD-10-CM | POA: Diagnosis present

## 2024-01-17 LAB — ECHOCARDIOGRAM COMPLETE
AR max vel: 3.64 cm2
AV Area VTI: 3.04 cm2
AV Area mean vel: 3.1 cm2
AV Mean grad: 5 mmHg
AV Peak grad: 10.1 mmHg
Ao pk vel: 1.59 m/s
Area-P 1/2: 3.01 cm2
S' Lateral: 3.13 cm

## 2024-01-21 ENCOUNTER — Other Ambulatory Visit

## 2024-01-21 ENCOUNTER — Ambulatory Visit: Admitting: Hematology and Oncology

## 2024-01-21 ENCOUNTER — Ambulatory Visit (INDEPENDENT_AMBULATORY_CARE_PROVIDER_SITE_OTHER): Admitting: Neurology

## 2024-01-21 ENCOUNTER — Ambulatory Visit: Payer: Self-pay | Admitting: Pulmonary Disease

## 2024-01-21 DIAGNOSIS — G6181 Chronic inflammatory demyelinating polyneuritis: Secondary | ICD-10-CM

## 2024-01-21 DIAGNOSIS — R200 Anesthesia of skin: Secondary | ICD-10-CM

## 2024-01-21 NOTE — Procedures (Signed)
  [facility]  [address] [place], [address]  [place], [place] [address] Tel: [phone] Fax: [phone] Test Date:  01/21/2024  Patient: Devin Erickson DOB: 1970-06-11 Physician: Tonita Blanch, DO  Sex: Male Height: 6' 2 Ref Phys: Tonita Blanch, DO  ID#: [id number]   Technician:    History: This is a 53 year old man referred for evaluation of left leg weakness.  NCV & EMG Findings: Extensive electrodiagnostic testing of the left lower extremity shows:  Left sural and superficial peroneal sensory responses are within normal limits. Left peroneal motor response at the extensor digitorum brevis is absent, and normal at the tibialis anterior.  Left tibial motor responses are within normal limits. Left tibial H reflex study is within normal limits. There is no evidence of active or chronic motor axonal loss changes affecting any of the tested muscles.  Motor unit configuration and recruitment pattern is within normal limits.  Impression: This is a normal study of the left lower extremity.  In particular, there is no evidence of a large fiber sensorimotor peripheral polyneuropathy or lumbosacral radiculopathy.   ___________________________ Tonita Blanch, DO    Nerve Conduction Studies   Stim Site NR Peak (ms) Norm Peak (ms) O-P Amp (V) Norm O-P Amp  Left Sup Peroneal Anti Sensory (Ant Lat Mall)  32 C  12 cm    2.7 <4.6 8.1 >4  Left Sural Anti Sensory (Lat Mall)  32 C  Calf    3.1 <4.6 12.1 >4     Stim Site NR Onset (ms) Norm Onset (ms) O-P Amp (mV) Norm O-P Amp Site1 Site2 Delta-0 (ms) Dist (cm) Vel (m/s) Norm Vel (m/s)  Left Peroneal Motor (Ext Dig Brev)  32 C  Ankle *NR  <6.0  >2.5 B Fib Ankle  0.0  >40  B Fib *NR     Poplt B Fib  0.0  >40  Poplt *NR            Left Peroneal TA Motor (Tib Ant)  32 C  Fib Head    2.7 <4.5 5.0 >3 Poplit Fib Head 1.3 10.0 77 >40  Poplit    4.0 <5.7 4.6         Left Tibial Motor (Abd Hall Brev)  32 C  Ankle    3.0 <6.0 4.8  >4 Knee Ankle 11.0 47.0 43 >40  Knee    14.0  2.6          Electromyography   Side Muscle Ins.Act Fibs Fasc Recrt Amp Dur Poly Activation Comment  Left AntTibialis Nml Nml Nml Nml Nml Nml Nml Nml N/A  Left Gastroc Nml Nml Nml Nml Nml Nml Nml Nml N/A  Left Flex Dig Long Nml Nml Nml Nml Nml Nml Nml Nml N/A  Left BicepsFemS Nml Nml Nml Nml Nml Nml Nml Nml N/A  Left RectFemoris Nml Nml Nml Nml Nml Nml Nml Nml N/A  Left GluteusMed Nml Nml Nml Nml Nml Nml Nml Nml N/A      Waveforms:

## 2024-01-24 ENCOUNTER — Ambulatory Visit: Admitting: Hematology and Oncology

## 2024-01-24 ENCOUNTER — Other Ambulatory Visit

## 2024-01-24 ENCOUNTER — Encounter: Payer: Self-pay | Admitting: Neurology

## 2024-01-25 ENCOUNTER — Telehealth: Payer: Self-pay

## 2024-01-25 NOTE — Telephone Encounter (Signed)
 Left message for patient about upcoming appointment on 10/1

## 2024-01-26 ENCOUNTER — Other Ambulatory Visit

## 2024-01-26 ENCOUNTER — Other Ambulatory Visit: Payer: Self-pay

## 2024-01-26 ENCOUNTER — Inpatient Hospital Stay

## 2024-01-26 ENCOUNTER — Inpatient Hospital Stay: Attending: Hematology and Oncology | Admitting: Hematology and Oncology

## 2024-01-26 ENCOUNTER — Telehealth: Payer: Self-pay

## 2024-01-26 VITALS — BP 129/73 | HR 106 | Temp 98.8°F | Resp 17 | Wt 275.2 lb

## 2024-01-26 DIAGNOSIS — Z800 Family history of malignant neoplasm of digestive organs: Secondary | ICD-10-CM | POA: Insufficient documentation

## 2024-01-26 DIAGNOSIS — R040 Epistaxis: Secondary | ICD-10-CM | POA: Insufficient documentation

## 2024-01-26 DIAGNOSIS — R5383 Other fatigue: Secondary | ICD-10-CM | POA: Diagnosis not present

## 2024-01-26 DIAGNOSIS — D509 Iron deficiency anemia, unspecified: Secondary | ICD-10-CM

## 2024-01-26 DIAGNOSIS — M3500 Sicca syndrome, unspecified: Secondary | ICD-10-CM | POA: Insufficient documentation

## 2024-01-26 DIAGNOSIS — G629 Polyneuropathy, unspecified: Secondary | ICD-10-CM | POA: Diagnosis not present

## 2024-01-26 LAB — CBC WITH DIFFERENTIAL/PLATELET
Abs Immature Granulocytes: 0.01 K/uL (ref 0.00–0.07)
Basophils Absolute: 0.1 K/uL (ref 0.0–0.1)
Basophils Relative: 1 %
Eosinophils Absolute: 0.1 K/uL (ref 0.0–0.5)
Eosinophils Relative: 2 %
HCT: 25.1 % — ABNORMAL LOW (ref 39.0–52.0)
Hemoglobin: 8.5 g/dL — ABNORMAL LOW (ref 13.0–17.0)
Immature Granulocytes: 0 %
Lymphocytes Relative: 28 %
Lymphs Abs: 1.7 K/uL (ref 0.7–4.0)
MCH: 23 pg — ABNORMAL LOW (ref 26.0–34.0)
MCHC: 33.9 g/dL (ref 30.0–36.0)
MCV: 67.8 fL — ABNORMAL LOW (ref 80.0–100.0)
Monocytes Absolute: 0.6 K/uL (ref 0.1–1.0)
Monocytes Relative: 11 %
Neutro Abs: 3.4 K/uL (ref 1.7–7.7)
Neutrophils Relative %: 58 %
Platelets: 254 K/uL (ref 150–400)
RBC: 3.7 MIL/uL — ABNORMAL LOW (ref 4.22–5.81)
RDW: 17.8 % — ABNORMAL HIGH (ref 11.5–15.5)
WBC: 5.9 K/uL (ref 4.0–10.5)
nRBC: 0 % (ref 0.0–0.2)

## 2024-01-26 LAB — FERRITIN: Ferritin: 30 ng/mL (ref 24–336)

## 2024-01-26 NOTE — Telephone Encounter (Signed)
 Pt was scheduled urgently with Dr Janett office. He will be seen tomorrow at 0930 to assist with refractory anemia per Dr Loretha. Pt is aware and agreeable.

## 2024-01-26 NOTE — Progress Notes (Signed)
 [facility] CONSULT NOTE  Patient Care Team: Tysinger, Alm RAMAN, PA-C as PCP - General (Family Medicine) [place], Peter M, MD as PCP - Cardiology (Cardiology) Tobie Tonita POUR, DO as Consulting Physician (Neurology) Dolphus Reiter, MD as Consulting Physician (Rheumatology) Eldonna Novel, MD as Consulting Physician (Physical Medicine and Rehabilitation) Regal, Pasco RAMAN, DPM as Consulting Physician (Podiatry) Rosalie Kitchens, MD as Consulting Physician (Gastroenterology)  CHIEF COMPLAINTS/PURPOSE OF CONSULTATION:  Anemia  ASSESSMENT & PLAN:   Assessment and Plan Assessment & Plan Iron  deficiency anemia  Iron  deficiency anemia Chronic iron  deficiency anemia with hemoglobin at 8.5, decreased from 9.8 and 9.5. Suspected gastrointestinal bleeding as primary source of blood loss. Differential includes gastrointestinal sources. Previous colonoscopy in 2021 was normal. Family history of pancreatic cancer noted. - Refer to gastroenterology for urgent evaluation, including endoscopy and colonoscopy. - Advise oral iron  supplements every other day and monitor epistaxis. Pt doesn't want to consider IV iron  infusion at this time. - We called GI office and got her scheduled to see one of their staff tomorrow so we can work up the etiology of IDA - Send referral to genetics for evaluation of family history of pancreatic cancer.  Fatigue secondary to iron  deficiency anemia Persistent fatigue likely due to chronic iron  deficiency anemia, not improved with previous iron  infusions. - Monitor fatigue symptoms with treatment for iron  deficiency anemia. - Advise follow-up in one month to assess response to oral iron .   HISTORY OF PRESENTING ILLNESS:  Devin Erickson 53 y.o. male is here because of anemia.  Discussed the use of AI scribe software for clinical note transcription with the patient, who gave verbal consent to proceed.  History of Present Illness   Devin Erickson is a  53 year old male with iron  deficiency anemia who is here for follow up.   Devin Erickson is a 53 year old male with iron  deficiency anemia who presents with persistent fatigue.  He experiences persistent fatigue despite receiving an iron  infusion in June. His energy levels have not improved, and his hemoglobin levels have decreased from 9.8 to 8.5, indicating a drop in iron  levels.  He experienced a severe epistaxis lasting over an hour in the past week, which he suspects may have contributed to his iron  loss. No blood in stool or urine, and no black stools have been noted.  He has a history of taking iron  pills, which he associates with increased epistaxis. He recalls being recommended iron  supplements as a child due to thalassemia, although he is unsure of the details. He tried iron  pills again last year, which also resulted in noticeable epistaxis.  He has a history of Sjogren's syndrome, for which he sees a rheumatologist. 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 He recently had an infusion for this condition.  There is a family history of pancreatic cancer in his father, but no known family history of colorectal or stomach cancer.  Socially, he consumes alcohol occasionally, primarily wine, but not on a daily basis.  All other systems were reviewed with the patient and are negative.  MEDICAL HISTORY:  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   History of sleep apnea 04/2012   per overnight screen/oximetry    Obesity  Thalassemia minor    Wears glasses     SURGICAL HISTORY: Past Surgical History:  Procedure Laterality Date   COLONOSCOPY  04/27/2010   [facility], normal per pt   COLONOSCOPY  08/2019   Dr. Oliva Boots, repeat in 5 years   TONSILLECTOMY      TONSILLECTOMY AND ADENOIDECTOMY     age 74yo    SOCIAL HISTORY: Social History   Socioeconomic History   Marital status: Married    Spouse name: Not on file   Number of children: 1   Years of education: Not on file   Highest education level: 12th grade  Occupational History   Not on file  Tobacco Use   Smoking status: Never    Passive exposure: Never   Smokeless tobacco: Never  Vaping Use   Vaping status: Never Used  Substance and Sexual Activity   Alcohol use: No    Comment: rarely   Drug use: No   Sexual activity: Not on file  Other Topics Concern   Not on file  Social History Narrative   Married, son, Ether - cleaning products, manufacturing, exercise - going gym 4 days per week.  Jehoviah Witness.  01/2021         Right Handed    Lives in a one story home    Occasionally caffeine    Social Drivers of Health   Financial Resource Strain: Low Risk  (06/29/2023)   Overall Financial Resource Strain (CARDIA)    Difficulty of Paying Living Expenses: Not very hard  Food Insecurity: No Food Insecurity (06/29/2023)   Hunger Vital Sign    Worried About Running Out of Food in the Last Year: Never true    Ran Out of Food in the Last Year: Never true  Transportation Needs: No Transportation Needs (06/29/2023)   PRAPARE - Administrator, Civil Service (Medical): No    Lack of Transportation (Non-Medical): No  Physical Activity: Sufficiently Active (06/29/2023)   Exercise Vital Sign    Days of Exercise per Week: 4 days    Minutes of Exercise per Session: 60 min  Stress: Stress Concern Present (06/29/2023)   [employer] of Occupational Health - Occupational Stress Questionnaire    Feeling of Stress : Rather much  Social Connections: Socially Integrated (06/29/2023)   Social Connection and Isolation Panel    Frequency of Communication with Friends and Family: More than three times a week    Frequency of Social Gatherings with Friends and Family: Once a week     Attends Religious Services: More than 4 times per year    Active Member of [employer] or Organizations: Yes    Attends Engineer, structural: More than 4 times per year    Marital Status: Married  Catering manager Violence: Not on file    FAMILY HISTORY: Family History  Problem Relation Age of Onset   Cancer Mother 65       colon   Hypertension Mother    Dementia Father    Cancer Father        pancreas   Healthy Brother    Healthy Son    Heart disease Neg Hx    Lung disease Neg Hx     ALLERGIES:  has no known allergies.  MEDICATIONS:  Current Outpatient Medications  Medication Sig Dispense Refill   albuterol  (VENTOLIN  HFA) 108 (90 Base) MCG/ACT inhaler Inhale 2 puffs into the lungs every 6 (six) hours as needed for wheezing or shortness of breath.  8 g 0   B Complex Vitamins (VITAMIN B COMPLEX PO) Take by mouth.     carvedilol  (COREG ) 3.125 MG tablet Take 1 tablet (3.125 mg total) by mouth 2 (two) times daily with a meal. 180 tablet 2   cetirizine (ZYRTEC) 10 MG tablet Take 10 mg by mouth daily.     ipratropium (ATROVENT ) 0.06 % nasal spray Place 2 sprays into both nostrils 4 (four) times daily. (Patient taking differently: Place 2 sprays into both nostrils 4 (four) times daily. PRN) 15 mL 2   Multiple Vitamin (MULTIVITAMIN) tablet Take 1 tablet by mouth daily.     pregabalin (LYRICA) 150 MG capsule Take 150 mg by mouth daily.     QNASL  80 MCG/ACT AERS Place 1 spray into both nostrils 2 (two) times daily.     rosuvastatin  (CRESTOR ) 20 MG tablet TAKE 1 TABLET BY MOUTH EVERYDAY AT BEDTIME 90 tablet 2   No current facility-administered medications for this visit.     PHYSICAL EXAMINATION: ECOG PERFORMANCE STATUS: 0 - Asymptomatic  Vitals:   01/26/24 1504  BP: 129/73  Pulse: (!) 106  Resp: 17  Temp: 98.8 F (37.1 C)  SpO2: 99%    Filed Weights   01/26/24 1504  Weight: 275 lb 3.2 oz (124.8 kg)    Physical Exam Constitutional:      Appearance: Normal  appearance.  Cardiovascular:     Rate and Rhythm: Normal rate and regular rhythm.     Pulses: Normal pulses.     Heart sounds: Normal heart sounds.  Pulmonary:     Effort: Pulmonary effort is normal.     Breath sounds: Normal breath sounds.  Musculoskeletal:        General: Normal range of motion.     Cervical back: Normal range of motion and neck supple. No rigidity.  Lymphadenopathy:     Cervical: No cervical adenopathy.  Skin:    General: Skin is warm and dry.  Neurological:     General: No focal deficit present.     Mental Status: He is alert.      LABORATORY DATA:  I have reviewed the data as listed Lab Results  Component Value Date   WBC 5.9 01/26/2024   HGB 8.5 (L) 01/26/2024   HCT 25.1 (L) 01/26/2024   MCV 67.8 (L) 01/26/2024   PLT 254 01/26/2024     Chemistry      Component Value Date/Time   NA 140 08/12/2023 1533   NA 139 07/07/2023 0849   K 4.0 08/12/2023 1533   CL 109 08/12/2023 1533   CO2 26 08/12/2023 1533   BUN 23 (H) 08/12/2023 1533   BUN 11 07/07/2023 0849   CREATININE 1.11 08/12/2023 1533   CREATININE 1.16 09/11/2022 1022      Component Value Date/Time   CALCIUM  9.4 08/12/2023 1533   ALKPHOS 50 08/12/2023 1533   AST 29 08/12/2023 1533   ALT 22 08/12/2023 1533   BILITOT 0.5 08/12/2023 1533       RADIOGRAPHIC STUDIES: I have personally reviewed the radiological images as listed and agreed with the findings in the report. NCV with EMG(electromyography) Result Date: 01/21/2024 Tobie Tonita POUR, DO     01/21/2024 11:30 AM [facility] [address] [place], [address]  [place], [place] [address] Tel: [phone] Fax: [phone] Test Date:  01/21/2024 Patient: Devin Erickson: [birth year] Physician: Tonita Tobie, DO Sex: Male Height: 6' 2 Ref Phys: Tonita Tobie, DO ID#: [id number]   Technician:  History:  This is a 53 year old man referred for evaluation of left leg weakness. NCV & EMG Findings: Extensive electrodiagnostic testing of the  left lower extremity shows: Left sural and superficial peroneal sensory responses are within normal limits. Left peroneal motor response at the extensor digitorum brevis is absent, and normal at the tibialis anterior.  Left tibial motor responses are within normal limits. Left tibial H reflex study is within normal limits. There is no evidence of active or chronic motor axonal loss changes affecting any of the tested muscles.  Motor unit configuration and recruitment pattern is within normal limits. Impression: This is a normal study of the left lower extremity.  In particular, there is no evidence of a large fiber sensorimotor peripheral polyneuropathy or lumbosacral radiculopathy. ___________________________ Tonita Blanch, DO Nerve Conduction Studies  Stim Site NR Peak (ms) Norm Peak (ms) O-P Amp (V) Norm O-P Amp Left Sup Peroneal Anti Sensory (Ant Lat Mall)  32 C 12 cm    2.7 <4.6 8.1 >4 Left Sural Anti Sensory (Lat Mall)  32 C Calf    3.1 <4.6 12.1 >4  Stim Site NR Onset (ms) Norm Onset (ms) O-P Amp (mV) Norm O-P Amp Site1 Site2 Delta-0 (ms) Dist (cm) Vel (m/s) Norm Vel (m/s) Left Peroneal Motor (Ext Dig Brev)  32 C Ankle *NR  <6.0  >2.5 B Fib Ankle  0.0  >40 B Fib *NR     Poplt B Fib  0.0  >40 Poplt *NR           Left Peroneal TA Motor (Tib Ant)  32 C Fib Head    2.7 <4.5 5.0 >3 Poplit Fib Head 1.3 10.0 77 >40 Poplit    4.0 <5.7 4.6        Left Tibial Motor (Abd Hall Brev)  32 C Ankle    3.0 <6.0 4.8 >4 Knee Ankle 11.0 47.0 43 >40 Knee    14.0  2.6        Electromyography  Side Muscle Ins.Act Fibs Fasc Recrt Amp Dur Poly Activation Comment Left AntTibialis Nml Nml Nml Nml Nml Nml Nml Nml N/A Left Gastroc Nml Nml Nml Nml Nml Nml Nml Nml N/A Left Flex Dig Long Nml Nml Nml Nml Nml Nml Nml Nml N/A Left BicepsFemS Nml Nml Nml Nml Nml Nml Nml Nml N/A Left RectFemoris Nml Nml Nml Nml Nml Nml Nml Nml N/A Left GluteusMed Nml Nml Nml Nml Nml Nml Nml Nml N/A Waveforms:             ECHOCARDIOGRAM COMPLETE Result  Date: 01/17/2024    ECHOCARDIOGRAM REPORT   Patient Name:   Devin Erickson Date of Exam: 01/17/2024 Medical Rec #:  [id number]          Height:       74.0 in Accession #:    [id number]         Weight:       277.0 lb Date of Birth:  [birth year]          BSA:          2.496 m Patient Age:    53 years           BP:           148/92 mmHg Patient Gender: M                  HR:           120 bpm. Exam Location:  [address] Procedure: 2D Echo, 3D Echo,  Cardiac Doppler and Color Doppler (Both Spectral            and Color Flow Doppler were utilized during procedure). 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 Risk Factors:Sleep Apnea, Dyslipidemia,                 Hypertension and Non-Smoker. Patient denies chest pain, SOB and                 leg edema. He has systemic scleroderma with question of                 pulmonary hypertension.  Sonographer:    Annabella Cater RVT, RDCS (AE), RDMS Referring Phys: [phone] [facility], [place]  Sonographer Comments: Global longitudinal strain was attempted. IMPRESSIONS  1. Left ventricular ejection fraction, by estimation, is 65 to 70%. Left ventricular ejection fraction by 3D volume is 69 %. The left ventricle has normal function. The left ventricle has no regional wall motion abnormalities. Left ventricular diastolic  parameters are consistent with Grade I diastolic dysfunction (impaired relaxation).  2. Right ventricular systolic function is normal. The right ventricular size is mildly enlarged. There is normal pulmonary artery systolic pressure. The estimated right ventricular systolic pressure is 33.4 mmHg.  3. Right atrial size was mildly dilated.  4. The mitral valve is normal in structure. Trivial mitral valve regurgitation. No evidence of mitral stenosis.  5. The aortic valve is tricuspid. Aortic valve regurgitation is not visualized. No aortic stenosis is  present.  6. Aortic dilatation noted. There is dilatation of the aortic root, measuring 41 mm.  7. The inferior vena cava is dilated in size with >50% respiratory variability, suggesting right atrial pressure of 8 mmHg. Comparison(s): EF 60%, GLS -21.2%. FINDINGS  Left Ventricle: Left ventricular ejection fraction, by estimation, is 65 to 70%. Left ventricular ejection fraction by 3D volume is 69 %. The left ventricle has normal function. The left ventricle has no regional wall motion abnormalities. The left ventricular internal cavity size was normal in size. There is no left ventricular hypertrophy. Left ventricular diastolic parameters are consistent with Grade I diastolic dysfunction (impaired relaxation). Right Ventricle: The right ventricular size is mildly enlarged. No increase in right ventricular wall thickness. Right ventricular systolic function is normal. There is normal pulmonary artery systolic pressure. The tricuspid regurgitant velocity is 2.52  m/s, and with an assumed right atrial pressure of 8 mmHg, the estimated right ventricular systolic pressure is 33.4 mmHg. Left Atrium: Left atrial size was normal in size. Right Atrium: Right atrial size was mildly dilated. Pericardium: There is no evidence of pericardial effusion. Mitral Valve: The mitral valve is normal in structure. Trivial mitral valve regurgitation. No evidence of mitral valve stenosis. Tricuspid Valve: The tricuspid valve is normal in structure. Tricuspid valve regurgitation is trivial. Aortic Valve: The aortic valve is tricuspid. Aortic valve regurgitation is not visualized. No aortic stenosis is present. Aortic valve mean gradient measures 5.0 mmHg. Aortic valve peak gradient measures 10.1 mmHg. Aortic valve area, by VTI measures 3.04  cm. Pulmonic Valve: The pulmonic valve was not well visualized. Pulmonic valve regurgitation is not visualized. Aorta: Aortic dilatation noted. There is dilatation of the aortic root, measuring 41 mm.  Venous: The inferior vena cava is dilated in size with greater than 50%  respiratory variability, suggesting right atrial pressure of 8 mmHg. IAS/Shunts: The interatrial septum was not well visualized. Additional Comments: 3D was performed not requiring image post processing on an independent workstation and was normal.  LEFT VENTRICLE PLAX 2D LVIDd:         5.09 cm         Diastology LVIDs:         3.13 cm         LV e' medial:    8.26 cm/s LV PW:         0.94 cm         LV E/e' medial:  7.0 LV IVS:        0.96 cm         LV e' lateral:   7.04 cm/s LVOT diam:     2.31 cm         LV E/e' lateral: 8.2 LV SV:         83 LV SV Index:   33 LVOT Area:     4.19 cm        3D Volume EF                                LV 3D EF:    Left                                             ventricul                                             ar                                             ejection                                             fraction                                             by 3D                                             volume is                                             69 %.                                 3D Volume EF:  3D EF:        69 %                                LV EDV:       174 ml                                LV ESV:       54 ml                                LV SV:        119 ml RIGHT VENTRICLE RV S prime:     23.50 cm/s  PULMONARY VEINS TAPSE (M-mode): 4.1 cm      Diastolic Velocity: 49.30 cm/s                             S/D Velocity:       1.80                             Systolic Velocity:  88.80 cm/s LEFT ATRIUM             Index        RIGHT ATRIUM           Index LA diam:        4.56 cm 1.83 cm/m   RA Area:     23.00 cm LA Vol (A2C):   70.6 ml 28.28 ml/m  RA Volume:   81.90 ml  32.81 ml/m LA Vol (A4C):   73.9 ml 29.61 ml/m LA Biplane Vol: 77.2 ml 30.93 ml/m  AORTIC VALVE                     PULMONIC VALVE AV Area (Vmax):    3.64 cm      PV Vmax:        1.27 m/s AV Area (Vmean):   3.10 cm      PV Peak grad:  6.4 mmHg AV Area (VTI):     3.04 cm AV Vmax:           159.00 cm/s AV Vmean:          105.000 cm/s AV VTI:            0.274 m AV Peak Grad:      10.1 mmHg AV Mean Grad:      5.0 mmHg LVOT Vmax:         138.00 cm/s LVOT Vmean:        77.700 cm/s LVOT VTI:          0.199 m LVOT/AV VTI ratio: 0.73  AORTA Ao Root diam: 4.14 cm Ao Asc diam:  3.57 cm Ao Arch diam: 3.2 cm MITRAL VALVE               TRICUSPID VALVE MV Area (PHT): 3.01 cm    TR Peak grad:   25.4 mmHg MV Decel Time: 252 msec    TR Vmax:        252.00 cm/s MV E velocity: 57.60 cm/s MV A velocity: 84.10 cm/s  SHUNTS MV E/A ratio:  0.68        Systemic VTI:  0.20 m                            Systemic Diam: 2.31 cm Lonni Nanas MD Electronically signed by Lonni Nanas MD Signature Date/Time: 01/17/2024/10:59:20 PM    Final     All questions were answered. The patient knows to call the clinic with any problems, questions or concerns. I spent 30 minutes in the care of this patient including H and P, review of records, counseling and coordination of care.   Amber Stalls, MD 01/26/2024 3:13 PM

## 2024-01-27 NOTE — Telephone Encounter (Signed)
 Called [facility] and was informed that they do not offer the Vyvgart Hytrulo injections.

## 2024-02-01 ENCOUNTER — Other Ambulatory Visit: Payer: Self-pay | Admitting: Neurology

## 2024-02-01 NOTE — Progress Notes (Signed)
 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  Pharmacy team will be notified to start PA.

## 2024-02-01 NOTE — Progress Notes (Signed)
 Dr. Tobie, I will submit the auth today and f/u once I have a response from the insurance.   Auth Submission: PENDING Site of care: Site of care: [facility] Payer: [employer] Medication & CPT/J Code(s) submitted: VYVGART Hytrulo [id number] Diagnosis Code: G61.81 CIPD Route of submission (phone, fax, portal):  Phone # Fax # Auth type: Buy/Bill PB Units/visits requested: X1 WEEKLY (indefinitely) Reference number:  Approval from:  to

## 2024-02-04 ENCOUNTER — Encounter: Payer: Self-pay | Admitting: *Deleted

## 2024-02-04 ENCOUNTER — Other Ambulatory Visit: Payer: Self-pay | Admitting: *Deleted

## 2024-02-04 ENCOUNTER — Other Ambulatory Visit: Payer: Self-pay | Admitting: Hematology and Oncology

## 2024-02-04 DIAGNOSIS — Z800 Family history of malignant neoplasm of digestive organs: Secondary | ICD-10-CM

## 2024-02-04 DIAGNOSIS — E1100 Type 2 diabetes mellitus with hyperosmolarity without nonketotic hyperglycemic-hyperosmolar coma (NKHHC): Secondary | ICD-10-CM | POA: Insufficient documentation

## 2024-02-04 DIAGNOSIS — K635 Polyp of colon: Secondary | ICD-10-CM | POA: Insufficient documentation

## 2024-02-04 DIAGNOSIS — D563 Thalassemia minor: Secondary | ICD-10-CM | POA: Insufficient documentation

## 2024-02-04 DIAGNOSIS — Z860100 Personal history of colon polyps, unspecified: Secondary | ICD-10-CM | POA: Insufficient documentation

## 2024-02-04 DIAGNOSIS — K219 Gastro-esophageal reflux disease without esophagitis: Secondary | ICD-10-CM | POA: Insufficient documentation

## 2024-02-08 NOTE — Progress Notes (Signed)
 Dr. Tobie, F/U:  I have called [employer] for an update on the auth and its still pending.  Im hopeful that we will have a decision by the end of the week. [name]

## 2024-02-09 ENCOUNTER — Ambulatory Visit (INDEPENDENT_AMBULATORY_CARE_PROVIDER_SITE_OTHER): Admitting: Otolaryngology

## 2024-02-11 LAB — HM COLONOSCOPY

## 2024-02-16 ENCOUNTER — Telehealth: Payer: Self-pay | Admitting: Pharmacy Technician

## 2024-02-16 ENCOUNTER — Telehealth: Payer: Self-pay | Admitting: Neurology

## 2024-02-16 NOTE — Telephone Encounter (Signed)
 Auth Submission: APPROVED Site of care: Site of care: [facility] Payer: [employer] Medication & CPT/J Code(s) submitted: VYVGART HYTRULLO [id number] Diagnosis Code:  Route of submission (phone, fax, portal): FAXED Phone # Fax # Auth type: Buy/Bill PB Units/visits requested: X1 WEEKLY  Reference number: [id number] Approval from: 02/15/24 to 05/17/24    CO-PAY CARD - ATLAS AWARE

## 2024-02-16 NOTE — Telephone Encounter (Signed)
 Who's calling (name and relationship to patient) : Danny:  Pharmacist; [facility]  Best contact number: [phone]  Provider they see: Dr.Patel   Reason for call: Salomon stating that he got a message from the patient that they will be discontinuing the Gaminex IG. Salomon is needing confirmation so that he can discharge Salvadore from services.    Call ID:      PRESCRIPTION REFILL ONLY  Name of prescription:  Pharmacy:

## 2024-02-17 ENCOUNTER — Ambulatory Visit (INDEPENDENT_AMBULATORY_CARE_PROVIDER_SITE_OTHER)

## 2024-02-17 VITALS — BP 146/84 | HR 80 | Temp 97.9°F | Resp 16 | Ht 75.0 in | Wt 277.8 lb

## 2024-02-17 DIAGNOSIS — G6181 Chronic inflammatory demyelinating polyneuritis: Secondary | ICD-10-CM | POA: Diagnosis not present

## 2024-02-17 MED ORDER — EFGARTIGIMOD ALFA-HYALUR-QVFC 180-2000 MG-UNIT/ML ~~LOC~~ SOLN
1008.0000 mg | Freq: Once | SUBCUTANEOUS | Status: AC
  Administered 2024-02-17: 1008 mg via SUBCUTANEOUS
  Filled 2024-02-17: qty 5.6

## 2024-02-17 NOTE — Progress Notes (Signed)
 Diagnosis: Myasthenia Gravis  Provider:  Mannam, Praveen MD  Procedure: Injection  Vyvgart  Hytrulo, Dose: 1008 mg, Site: subcutaneous, Number of injections: 1  Injection Site(s): Right lower quad. abdomne  Post Care: Observation period completed  Discharge: Condition: Good, Destination: Home . AVS Declined  Performed by:  Maximiano JONELLE Pouch, LPN

## 2024-02-18 NOTE — Telephone Encounter (Signed)
 Danny From  [facility] would like a return call back or a fax about confirmation  about discharge. Thanks

## 2024-02-18 NOTE — Telephone Encounter (Signed)
 [facility] and informed him that I want to double check with Dr. Tobie about orders and once I get confirmation I will contact him back. Informed Danny that Dr. Tobie returns back on Monday and I will give him a call then. Danny verbalized understanding and had no further questions or concerns.

## 2024-02-21 NOTE — Telephone Encounter (Signed)
 Called Danny at [facility] and informed him to discontinue Gaminex IG and infusions with [facility]. Danny verbalized understanding and had no further questions or concerns.

## 2024-02-21 NOTE — Telephone Encounter (Signed)
 Yes, we let them know that he will not be getting IVIG.  Please request them to send orders to discharge patient from [facility], thank you.

## 2024-02-23 ENCOUNTER — Inpatient Hospital Stay (HOSPITAL_BASED_OUTPATIENT_CLINIC_OR_DEPARTMENT_OTHER): Admitting: Hematology and Oncology

## 2024-02-23 ENCOUNTER — Inpatient Hospital Stay

## 2024-02-23 VITALS — BP 139/81 | HR 92 | Temp 98.1°F | Resp 18 | Wt 272.6 lb

## 2024-02-23 DIAGNOSIS — D509 Iron deficiency anemia, unspecified: Secondary | ICD-10-CM

## 2024-02-23 LAB — IRON AND IRON BINDING CAPACITY (CC-WL,HP ONLY)
Iron: 24 ug/dL — ABNORMAL LOW (ref 45–182)
Saturation Ratios: 5 % — ABNORMAL LOW (ref 17.9–39.5)
TIBC: 455 ug/dL — ABNORMAL HIGH (ref 250–450)
UIBC: 431 ug/dL — ABNORMAL HIGH (ref 117–376)

## 2024-02-23 LAB — CBC WITH DIFFERENTIAL/PLATELET
Abs Immature Granulocytes: 0.01 K/uL (ref 0.00–0.07)
Basophils Absolute: 0.1 K/uL (ref 0.0–0.1)
Basophils Relative: 1 %
Eosinophils Absolute: 0.2 K/uL (ref 0.0–0.5)
Eosinophils Relative: 3 %
HCT: 28.5 % — ABNORMAL LOW (ref 39.0–52.0)
Hemoglobin: 9.3 g/dL — ABNORMAL LOW (ref 13.0–17.0)
Immature Granulocytes: 0 %
Lymphocytes Relative: 28 %
Lymphs Abs: 1.6 K/uL (ref 0.7–4.0)
MCH: 20.5 pg — ABNORMAL LOW (ref 26.0–34.0)
MCHC: 32.6 g/dL (ref 30.0–36.0)
MCV: 62.9 fL — ABNORMAL LOW (ref 80.0–100.0)
Monocytes Absolute: 0.7 K/uL (ref 0.1–1.0)
Monocytes Relative: 11 %
Neutro Abs: 3.3 K/uL (ref 1.7–7.7)
Neutrophils Relative %: 57 %
Platelets: 291 K/uL (ref 150–400)
RBC: 4.53 MIL/uL (ref 4.22–5.81)
RDW: 18.4 % — ABNORMAL HIGH (ref 11.5–15.5)
WBC: 5.9 K/uL (ref 4.0–10.5)
nRBC: 0 % (ref 0.0–0.2)

## 2024-02-23 NOTE — Progress Notes (Signed)
 [facility] CONSULT NOTE  Patient Care Team: Tysinger, Alm RAMAN, PA-C as PCP - General (Family Medicine) Jordan, Peter M, MD as PCP - Cardiology (Cardiology) Tobie Tonita POUR, DO as Consulting Physician (Neurology) Dolphus Reiter, MD as Consulting Physician (Rheumatology) Eldonna Novel, MD as Consulting Physician (Physical Medicine and Rehabilitation) Regal, Pasco RAMAN, DPM as Consulting Physician (Podiatry) Rosalie Kitchens, MD as Consulting Physician (Gastroenterology)  CHIEF COMPLAINTS/PURPOSE OF CONSULTATION:  Anemia  ASSESSMENT & PLAN:  Assessment & Plan Iron  deficiency anemia  Iron  deficiency anemia Chronic iron  deficiency anemia with hemoglobin at 8.5, decreased from 9.8 and 9.5. Suspected gastrointestinal bleeding as primary source of blood loss. Differential includes gastrointestinal sources. Previous colonoscopy in 2021 was normal. Family history of pancreatic cancer noted. - Refer to gastroenterology for urgent evaluation, including endoscopy and colonoscopy. - Advise oral iron  supplements every other day and monitor epistaxis. Pt doesn't want to consider IV iron  infusion at this time. - We called GI office and got her scheduled to see one of their staff tomorrow so we can work up the etiology of IDA - Send referral to genetics for evaluation of family history of pancreatic cancer.  Fatigue secondary to iron  deficiency anemia Persistent fatigue likely due to chronic iron  deficiency anemia, not improved with previous iron  infusions. - Monitor fatigue symptoms with treatment for iron  deficiency anemia. - Advise follow-up in one month to assess response to oral iron .   Chronic iron  deficiency anemia Hemoglobin at 9.3, improved but subnormal. Likely due to small bowel angiodysplasia or other bleeding source. Microcytic RBCs suggest iron  deficiency. Thalassemia unlikely due to low iron . Anemia onset in 2022. - Continue oral iron  supplementation every other day. -  Schedule capsule endoscopy for small bowel bleeding investigation. - Discuss IV iron  infusion; consider initiation in two weeks if no bleeding source identified. - Monitor hemoglobin levels and symptoms. - Follow up in six weeks to reassess anemia and treatment response.   HISTORY OF PRESENTING ILLNESS:  Devin Erickson 53 y.o. male is here because of anemia.  Discussed the use of AI scribe software for clinical note transcription with the patient, who gave verbal consent to proceed.  History of Present Illness   Demontrez Erickson is a 53 year old male with iron  deficiency anemia who is here for follow up.   Devin Erickson is a 53 year old male with iron  deficiency anemia who presents with persistent fatigue.  He experiences persistent fatigue despite receiving an iron  infusion in June. His energy levels have not improved, and his hemoglobin levels have decreased from 9.8 to 8.5, indicating a drop in iron  levels.  He experienced a severe epistaxis lasting over an hour in the past week, which he suspects may have contributed to his iron  loss. No blood in stool or urine, and no black stools have been noted.  He has a history of taking iron  pills, which he associates with increased epistaxis. He recalls being recommended iron  supplements as a child due to thalassemia, although he is unsure of the details. He tried iron  pills again last year, which also resulted in noticeable epistaxis.  He has a history of Sjogren's syndrome, for which he sees a rheumatologist. 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 He recently had an infusion for this condition.  There is a family history of pancreatic cancer in his father, but no known family history of colorectal or stomach cancer.  Socially, he consumes alcohol occasionally, primarily wine, but not  on a daily basis.   Devin Erickson is a 53 year old male who presents with persistent iron   deficiency anemia. He has been experiencing persistent iron  deficiency anemia. Despite taking oral iron  supplements every other day, his hemoglobin level is currently 9.3, indicating improvement but still below normal. A previous colonoscopy did not reveal a source of bleeding.  No visible blood in his stool or black stools. He experiences fatigue, cold and pale hands, and numbness in his hands and feet. He becomes tired easily, sometimes falling asleep quickly after work. Despite these symptoms, he is still able to perform some physical activities, although he tires more quickly than usual.  He has a history of receiving iron  infusions in the past, which he tolerated well. He is currently receiving immunoglobulin infusions twice a week for another condition. He is awaiting a capsule endoscopy to further investigate potential sources of bleeding in the small bowel.  All other systems were reviewed with the patient and are negative.  MEDICAL HISTORY:  Past Medical History:  Diagnosis Date  . Anxiety   . 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 Eczema   . H/O exercise stress test 2009   [facility], normal per pt  . Hair loss    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  . History of sleep apnea 04/2012   per overnight screen/oximetry   . Obesity   . Thalassemia minor   . Wears glasses     SURGICAL HISTORY: Past Surgical History:  Procedure Laterality Date  . COLONOSCOPY  04/27/2010   [facility], normal per pt  . COLONOSCOPY  08/2019   Dr. Oliva Boots, repeat in 5 years  . TONSILLECTOMY    . TONSILLECTOMY AND ADENOIDECTOMY     age 69yo    SOCIAL HISTORY: Social History   Socioeconomic History  . Marital status: Married    Spouse name: Not on file  . Number of children: 1  . Years of education: Not on file  . Highest education level: 12th grade  Occupational History  . Not on file  Tobacco Use  . Smoking status: Never    Passive exposure: Never  . Smokeless tobacco: Never  Vaping  Use  . Vaping status: Never Used  Substance and Sexual Activity  . Alcohol use: No    Comment: rarely  . Drug use: No  . Sexual activity: Not on file  Other Topics Concern  . Not on file  Social History Narrative   Married, son, [employer] - cleaning products, manufacturing, exercise - going gym 4 days per week.  [employer] Witness.  01/2021         Right Handed    Lives in a one story home    Occasionally caffeine    Social Drivers of Health   Financial Resource Strain: Low Risk  (06/29/2023)   Overall Financial Resource Strain (CARDIA)   . Difficulty of Paying Living Expenses: Not very hard  Food Insecurity: No Food Insecurity (06/29/2023)   Hunger Vital Sign   . Worried About Programme Researcher, Broadcasting/film/video in the Last Year: Never true   . Ran Out of Food in the Last Year: Never true  Transportation Needs: No Transportation Needs (06/29/2023)   PRAPARE - Transportation   . Lack of Transportation (Medical): No   . Lack of Transportation (Non-Medical): No  Physical Activity: Sufficiently Active (06/29/2023)   Exercise Vital Sign   . Days of Exercise per Week: 4 days   . Minutes of Exercise per Session:  60 min  Stress: Stress Concern Present (06/29/2023)   [employer] of Occupational Health - Occupational Stress Questionnaire   . Feeling of Stress : Rather much  Social Connections: Socially Integrated (06/29/2023)   Social Connection and Isolation Panel   . Frequency of Communication with Friends and Family: More than three times a week   . Frequency of Social Gatherings with Friends and Family: Once a week   . Attends Religious Services: More than 4 times per year   . Active Member of Clubs or Organizations: Yes   . Attends Banker Meetings: More than 4 times per year   . Marital Status: Married  Catering Manager Violence: Not on file    FAMILY HISTORY: Family History  Problem Relation Age of Onset  . Cancer Mother 105       colon  . Hypertension Mother   . Dementia  Father   . Cancer Father        pancreas  . Healthy Brother   . Healthy Son   . Heart disease Neg Hx   . Lung disease Neg Hx     ALLERGIES:  has no known allergies.  MEDICATIONS:  Current Outpatient Medications  Medication Sig Dispense Refill  . albuterol  (VENTOLIN  HFA) 108 (90 Base) MCG/ACT inhaler Inhale 2 puffs into the lungs every 6 (six) hours as needed for wheezing or shortness of breath. 8 g 0  . B Complex Vitamins (VITAMIN B COMPLEX PO) Take by mouth.    . carvedilol  (COREG ) 3.125 MG tablet Take 1 tablet (3.125 mg total) by mouth 2 (two) times daily with a meal. 180 tablet 2  . cetirizine (ZYRTEC) 10 MG tablet Take 10 mg by mouth daily.    SABRA ipratropium (ATROVENT ) 0.06 % nasal spray Place 2 sprays into both nostrils 4 (four) times daily. (Patient taking differently: Place 2 sprays into both nostrils 4 (four) times daily. PRN) 15 mL 2  . Multiple Vitamin (MULTIVITAMIN) tablet Take 1 tablet by mouth daily.    . pregabalin (LYRICA) 150 MG capsule Take 150 mg by mouth daily.    . QNASL  80 MCG/ACT AERS Place 1 spray into both nostrils 2 (two) times daily.    . rosuvastatin  (CRESTOR ) 20 MG tablet TAKE 1 TABLET BY MOUTH EVERYDAY AT BEDTIME 90 tablet 2   No current facility-administered medications for this visit.     PHYSICAL EXAMINATION: ECOG PERFORMANCE STATUS: 0 - Asymptomatic  Vitals:   02/23/24 1522  BP: 139/81  Pulse: 92  Resp: 18  Temp: 98.1 F (36.7 C)  SpO2: 99%    Filed Weights   02/23/24 1522  Weight: 272 lb 9.6 oz (123.7 kg)    Physical Exam Constitutional:      Appearance: Normal appearance.  Cardiovascular:     Rate and Rhythm: Normal rate and regular rhythm.     Pulses: Normal pulses.     Heart sounds: Normal heart sounds.  Pulmonary:     Effort: Pulmonary effort is normal.     Breath sounds: Normal breath sounds.  Musculoskeletal:        General: Normal range of motion.     Cervical back: Normal range of motion and neck supple. No rigidity.   Lymphadenopathy:     Cervical: No cervical adenopathy.  Skin:    General: Skin is warm and dry.  Neurological:     General: No focal deficit present.     Mental Status: He is alert.      LABORATORY  DATA:  I have reviewed the data as listed Lab Results  Component Value Date   WBC 5.9 02/23/2024   HGB 9.3 (L) 02/23/2024   HCT 28.5 (L) 02/23/2024   MCV 62.9 (L) 02/23/2024   PLT 291 02/23/2024     Chemistry      Component Value Date/Time   NA 140 08/12/2023 1533   NA 139 07/07/2023 0849   K 4.0 08/12/2023 1533   CL 109 08/12/2023 1533   CO2 26 08/12/2023 1533   BUN 23 (H) 08/12/2023 1533   BUN 11 07/07/2023 0849   CREATININE 1.11 08/12/2023 1533   CREATININE 1.16 09/11/2022 1022      Component Value Date/Time   CALCIUM  9.4 08/12/2023 1533   ALKPHOS 50 08/12/2023 1533   AST 29 08/12/2023 1533   ALT 22 08/12/2023 1533   BILITOT 0.5 08/12/2023 1533       RADIOGRAPHIC STUDIES: I have personally reviewed the radiological images as listed and agreed with the findings in the report. No results found.   All questions were answered. The patient knows to call the clinic with any problems, questions or concerns. I spent 30 minutes in the care of this patient including H and P, review of records, counseling and coordination of care.   Amber Stalls, MD 02/23/2024 3:37 PM

## 2024-02-24 ENCOUNTER — Ambulatory Visit (INDEPENDENT_AMBULATORY_CARE_PROVIDER_SITE_OTHER)

## 2024-02-24 VITALS — BP 131/89 | HR 85 | Temp 98.0°F | Resp 16 | Ht 75.0 in | Wt 281.0 lb

## 2024-02-24 DIAGNOSIS — G6181 Chronic inflammatory demyelinating polyneuritis: Secondary | ICD-10-CM | POA: Diagnosis not present

## 2024-02-24 MED ORDER — EFGARTIGIMOD ALFA-HYALUR-QVFC 180-2000 MG-UNIT/ML ~~LOC~~ SOLN
1008.0000 mg | Freq: Once | SUBCUTANEOUS | Status: AC
  Administered 2024-02-24: 1008 mg via SUBCUTANEOUS
  Filled 2024-02-24: qty 5.6

## 2024-02-24 NOTE — Progress Notes (Signed)
 Diagnosis: Myasthenia Gravis  Provider:  Mannam, Praveen MD  Procedure: Injection  Vyvgart Hytrulo, Dose: 1008 mg, Site: subcutaneous, Number of injections: 1  Injection Site(s): Left lower quad. abdomen  Post Care: Observation period completed  Discharge: Condition: Good, Destination: Home . AVS Declined  Performed by:  Maximiano JONELLE Pouch, LPN

## 2024-02-25 ENCOUNTER — Encounter: Payer: Self-pay | Admitting: Neurology

## 2024-02-28 ENCOUNTER — Encounter: Payer: Self-pay | Admitting: Radiology

## 2024-03-02 ENCOUNTER — Ambulatory Visit

## 2024-03-02 VITALS — BP 152/82 | HR 92 | Temp 97.9°F | Resp 16 | Ht 75.0 in | Wt 277.6 lb

## 2024-03-02 DIAGNOSIS — G6181 Chronic inflammatory demyelinating polyneuritis: Secondary | ICD-10-CM

## 2024-03-02 MED ORDER — EFGARTIGIMOD ALFA-HYALUR-QVFC 180-2000 MG-UNIT/ML ~~LOC~~ SOLN
1008.0000 mg | Freq: Once | SUBCUTANEOUS | Status: AC
  Administered 2024-03-02: 1008 mg via SUBCUTANEOUS
  Filled 2024-03-02: qty 5.6

## 2024-03-02 NOTE — Progress Notes (Signed)
 Diagnosis: CIDP  Provider:  Mannam, Praveen MD  Procedure: Injection  Vyvgart Hytrulo, Dose: 1008mg , Site: subcutaneous, Number of injections: 1  Injection Site(s): Right lower quad. abdomne  Post Care: Observation period completed  Discharge: Condition: Good, Destination: Home . AVS Declined  Performed by:  Jann Milkovich, RN

## 2024-03-09 ENCOUNTER — Ambulatory Visit (INDEPENDENT_AMBULATORY_CARE_PROVIDER_SITE_OTHER): Admitting: Otolaryngology

## 2024-03-09 ENCOUNTER — Encounter (INDEPENDENT_AMBULATORY_CARE_PROVIDER_SITE_OTHER): Payer: Self-pay | Admitting: Otolaryngology

## 2024-03-09 ENCOUNTER — Ambulatory Visit

## 2024-03-09 VITALS — BP 136/88 | HR 76 | Ht 75.0 in | Wt 268.0 lb

## 2024-03-09 DIAGNOSIS — R040 Epistaxis: Secondary | ICD-10-CM

## 2024-03-09 NOTE — Progress Notes (Signed)
 Patient ID: Devin Erickson, male   DOB: 1971-03-26, 53 y.o.   MRN: [id number]  Procedure:  Endoscopic control of recurrent right epistaxis  Indication: The patient is a 53 year old male who presents today complaining of more recurrent right-sided epistaxis.  He has been symptomatic for several years.  He underwent endoscopic cauterization of his right nasal septum in January 2024.  According to the patient, he continues to have recurrent right-sided epistaxis.  However, the severity and frequency of his bleeding has increased over the past 2 months.  He is not on any blood thinner.  He denies any recent nasal trauma.  Description:  The right nasal cavity is sprayed with topical xylocaine  and neo-synephrine.  After adequate anesthesia is achieved, the nasal cavity is examined with a 0 rigid endoscope.  A suction catheter is inserted in parallel with the 0 endoscope, and it is used to suction blood clots from the right nasal cavity.  Hypervascular areas are noted at the anterior and superior aspect of the nasal septum.  A silver nitrate stick is inserted in parallel with the 0 endoscope.  It is used to repeatedly cauterized the hypervascular areas.  Good hemostasis is achieved.  The patient tolerated the procedure well.  Assessment: 1.  Hypervascular areas are noted on the right anterior and superior nasal septum.   2.  Active bleeding is noted today.    Plan: 1. Endoscopic cauterization of the right anterior and superior nasal septum. 2. The nasal endoscopy findings are reviewed with the patient. 3. Nasal ointment/humidifier to treat the nasal dryness. 4. The patient will return for re-evaluation in 1 month.

## 2024-03-13 ENCOUNTER — Ambulatory Visit

## 2024-03-13 VITALS — BP 157/96 | HR 106 | Temp 98.2°F | Resp 18 | Ht 75.0 in | Wt 279.8 lb

## 2024-03-13 DIAGNOSIS — G6181 Chronic inflammatory demyelinating polyneuritis: Secondary | ICD-10-CM | POA: Diagnosis not present

## 2024-03-13 MED ORDER — EFGARTIGIMOD ALFA-HYALUR-QVFC 180-2000 MG-UNIT/ML ~~LOC~~ SOLN
1008.0000 mg | Freq: Once | SUBCUTANEOUS | Status: AC
  Administered 2024-03-13: 1008 mg via SUBCUTANEOUS
  Filled 2024-03-13: qty 5.6

## 2024-03-13 NOTE — Progress Notes (Signed)
 Diagnosis: CIDP (chronic inflammatory demyelinating polyneuropathy)   Provider:  Mannam, Praveen MD  Procedure: Injection  Vyvgart  Hytrulo, Dose: 1008 mg , Site: subcutaneous, Number of injections: 1  Injection Site(s): Right lower quad. abdomne  Post Care: Observation period completed  Discharge: Condition: Good, Destination: Home . AVS Declined  Performed by:  Lauran Pollard, LPN

## 2024-03-16 ENCOUNTER — Ambulatory Visit

## 2024-03-20 ENCOUNTER — Ambulatory Visit (INDEPENDENT_AMBULATORY_CARE_PROVIDER_SITE_OTHER)

## 2024-03-20 VITALS — BP 147/89 | HR 82 | Temp 97.8°F | Resp 18 | Ht 75.0 in | Wt 283.8 lb

## 2024-03-20 DIAGNOSIS — G6181 Chronic inflammatory demyelinating polyneuritis: Secondary | ICD-10-CM

## 2024-03-20 MED ORDER — EFGARTIGIMOD ALFA-HYALUR-QVFC 180-2000 MG-UNIT/ML ~~LOC~~ SOLN
1008.0000 mg | Freq: Once | SUBCUTANEOUS | Status: AC
  Administered 2024-03-20: 1008 mg via SUBCUTANEOUS
  Filled 2024-03-20: qty 5.6

## 2024-03-20 NOTE — Progress Notes (Signed)
 Diagnosis: CIDP (chronic inflammatory demyelinating polyneuropathy)   Provider:  Mannam, Praveen MD  Procedure: Injection  Vyvgart , Dose: 1008 mg, Site: subcutaneous, Number of injections: 1  Injection Site(s): Left lower quad. abdomen  Post Care: left lower quad abdominal injection  Discharge: Condition: Good, Destination: Home . AVS Declined  Performed by:  Maximiano JONELLE Pouch, LPN

## 2024-03-28 MED ORDER — EFGARTIGIMOD ALFA-HYALUR-QVFC 180-2000 MG-UNIT/ML ~~LOC~~ SOLN: 1008.0000 mg | Freq: Once | SUBCUTANEOUS | Status: DC

## 2024-03-30 ENCOUNTER — Ambulatory Visit

## 2024-03-30 VITALS — BP 149/91 | HR 88 | Temp 97.7°F | Resp 16 | Ht 75.0 in | Wt 284.8 lb

## 2024-03-30 DIAGNOSIS — G6181 Chronic inflammatory demyelinating polyneuritis: Secondary | ICD-10-CM | POA: Diagnosis not present

## 2024-03-30 MED ORDER — EFGARTIGIMOD ALFA-HYALUR-QVFC 180-2000 MG-UNIT/ML ~~LOC~~ SOLN
1008.0000 mg | Freq: Once | SUBCUTANEOUS | Status: AC
  Administered 2024-03-30: 1008 mg via SUBCUTANEOUS
  Filled 2024-03-30: qty 5.6

## 2024-03-30 NOTE — Progress Notes (Signed)
 Diagnosis: CIPD  Provider:  Mannam, Praveen MD  Procedure: Injection  Vyvgart  Hytrulo, Dose: 1008 mg, Site: subcutaneous, Number of injections: 1  Injection Site(s): Right lower quad. abdomne  Post Care: Observation period completed and Subcutaneous infusion needle discontinued  Discharge: Condition: Good, Destination: Home . AVS Declined  Performed by:  Leita FORBES Miles, LPN

## 2024-04-04 ENCOUNTER — Telehealth: Payer: Self-pay

## 2024-04-04 ENCOUNTER — Ambulatory Visit: Admitting: Neurology

## 2024-04-04 ENCOUNTER — Encounter: Payer: Self-pay | Admitting: Neurology

## 2024-04-04 VITALS — BP 148/90 | HR 74 | Ht 75.0 in | Wt 283.0 lb

## 2024-04-04 DIAGNOSIS — G6181 Chronic inflammatory demyelinating polyneuritis: Secondary | ICD-10-CM

## 2024-04-04 NOTE — Telephone Encounter (Signed)
 Left message on voicemail about upcoming appointment on 12/10

## 2024-04-04 NOTE — Progress Notes (Signed)
 Follow-up Visit   Date: 04/04/2024    Devin Erickson MRN: [id number] DOB: 12/09/1970    Devin Erickson is a 53 y.o. right-handed African American male with hyperlipidemia and anxiety returning to the clinic for follow-up of polyradiculoneuropathy.  The patient was accompanied to the clinic by self.   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  Initially, he was unable to do IVIG due to time constraints and opted to start Cellcept .  Because of worsening symptoms, IVIG was restarted in January 2025 - September 2025, after transitioning to IVIG.  Due to no improvement on IVIG, Vyvgart  Hytrulo weekly injection started in October 2025.  Clinically, right hand weakness is stable with no new weakness/paresthesia.    - Continue Vyvgart  Hytrulo weekly injection  2.   Cervical spondylosis with mild multilevel foraminal stenosis at C5-6 and C6-7 which would not cause the severity of his hand weakness.  Followed by pain management.   3.  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    Return to clinic in 4 months   --------------------------------------------- History of present illness: Starting around April 2023, he began noticing mild weakness in the arm especially when weight lifting, it was more effortful on the right side.  Over the summer, he noticed weakness in grip on the right hand which continued to progress to the point where he is unable to use his hand to crank his ignition, turning keys to entire his home, grasping objects, and using utensils.  He is dropping things frequently.  He has difficulty straightening the hand.  He denies weakness with raising the arm or bending at the elbow.  He has mild pain in the right shoulder,  described as a nagging pain.  He treated it with tylenol .  Occasionally, he has numbness over the tips of the fingers.     He denies similar symptoms in the left hand.  No numbness/tingling in the feet.  He denies any ongoing pain or neck pain.  MRI cervical spine was performed in July which shows cervical spondylosis and multilevel foraminal stenosis at right C3-C4, bilateral C4-5 and left C6-7. Additionally, there was note of edema signal in the C6 and C7 spinous process as well as interspinous ligaments at C5-6, C6-7, C7-T1, and T1-T2.     NCS/EMG performed in September shows diffuse prolonged latency, slowed motor conduction velocity, and reduced amplitude involving the median and ulnar nerves.  Needle EMG shows active fibrillation potentials in the FDI, ABP, EDC, and biceps muscles.  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 Labs indicate positive ANA.   UPDATE 03/25/2022:  He is here to discuss results of EMG which favors a polyradiculoneuropathy affecting both arms, worse on the right.  He reports having discomfort in the left shoulder, similar to what he experienced in the beginning on the right.  No numbness/tinging of the left arm.  His right hand remains unchanged and continues to have weakness.  He has difficulty with fine motor tasks.   UPDATE 06/01/2022:  He is here for follow-up visit and discuss LP results.  There has been no significant change with his hand weakness or tingling, which remains worse in the right hand.  No similar  symptoms in the legs or imbalance.  LP shows elevated protein of 116, normal white count and presence of oligoclonal bands.  IgG is normal.   UPDATE 09/15/2022:  He is here for follow-up visit.  Since his last visit, he continued to have additional monthly solumedrol and reports no significant change.  He is having  mild tingling in the left hand and has noticed some weakness.   He was evaluated by Dr. Dolphus in Rheumatology last week whose evaluation led to the diagnosis of limited systemic sclerosis.    UPDATE 12/29/2022:  He is here for follow-up.  He has not noticed any new weakness of the hand and feels that symptoms stabilized with slight improvement.  He is tolerating Cellcept  500mg  BID.  No new weakness, numbness/tingling.  He will be seeing rheumatology next week.  He has noticed that it takes longer for him to get over a viral illness now.  He has a URI several weeks ago and only now starting to feel better.   He reports having right shoulder stiffness which started a few weeks ago.  More recently, he began noticing progressive weakness in the right hand.  He is unable to hold items in the right hand, such as a fork.  He has had a few spells of right leg buckling.  No new tingling or numbness.   UPDATE 05/04/2023:  He is having more right shoulder pain, which is described as achy/pressure over the scapula.  Sleeping on the right side makes it worse.  He has tried tylenol  which provides some benefit.  No improvement with flexeril  which his PCP prescribed.  He has not started IVIG as it was recently approved, but is scheduled to have this on 1/28.  He feels that right hand is overall stable, weakness is worse in the evening.    UPDATE 08/02/2023:  He is here for follow-up visit.  He has been on IVIG and reports no improvement or worsening.  He is tolerating IVIG well.  Overall, he feels that right hand is stable.  Sometimes, he feels that his left leg is starting to feel different - no numbness, tingling, or weakness.  He is also seeing pain management because of ongoing right shoulder pain.  He takes Lyrica 150mg  daily which controls his pain much better.   He has noticed increased skin lesion and nodules involving the forehead and scalp.   UPDATE 11/01/2023:  He is here for 3 month follow-up visit.  There has  been no significant change in the weakness of his right hand.  He injured his right 4th finger and did not know how it occurred because of the numbness.  He continues to get IVIG every 4 weeks.  He is reports imbalance on the left side and weakness, as well as numbness over the left lower leg.  He has mild low back pain, no radicular pain.   He will be getting iron  infusions this week.   UPDATE 04/04/2024:  He is here for follow-up visit.  He switched to Vyvgart  Hytrulo in October and is no longer on IVIG.  He is tolerating the injections well and denies any new weakness.  NCS/EMG of the left leg was normal.  He reports having cold hands and feet and has known Raynaud's.  No new complaints.   Medications:  Current Outpatient Medications on File Prior to Visit  Medication Sig Dispense Refill   albuterol  (VENTOLIN  HFA) 108 (90 Base) MCG/ACT inhaler Inhale 2 puffs into the lungs every  6 (six) hours as needed for wheezing or shortness of breath. 8 g 0   B Complex Vitamins (VITAMIN B COMPLEX PO) Take by mouth.     carvedilol  (COREG ) 3.125 MG tablet Take 1 tablet (3.125 mg total) by mouth 2 (two) times daily with a meal. 180 tablet 2   cetirizine (ZYRTEC) 10 MG tablet Take 10 mg by mouth daily.     ipratropium (ATROVENT ) 0.06 % nasal spray Place 2 sprays into both nostrils 4 (four) times daily. (Patient taking differently: Place 2 sprays into both nostrils 4 (four) times daily. PRN) 15 mL 2   Multiple Vitamin (MULTIVITAMIN) tablet Take 1 tablet by mouth daily.     QNASL  80 MCG/ACT AERS Place 1 spray into both nostrils 2 (two) times daily.     rosuvastatin  (CRESTOR ) 20 MG tablet TAKE 1 TABLET BY MOUTH EVERYDAY AT BEDTIME 90 tablet 2   pregabalin (LYRICA) 150 MG capsule Take 150 mg by mouth daily. (Patient not taking: Reported on 04/04/2024)     No current facility-administered medications on file prior to visit.    Allergies: No Known Allergies  Vital Signs:  BP (!) 148/90   Pulse 74   Ht 6' 3  (1.905 m)   Wt 283 lb (128.4 kg)   SpO2 97%   BMI 35.37 kg/m   Neurological Exam: MENTAL STATUS including orientation to time, place, person, recent and remote memory, attention span and concentration, language, and fund of knowledge is normal.  Speech is not dysarthric.  CRANIAL NERVES:   Normal conjugate, extra-ocular eye movements in all directions of gaze.  No ptosis.  Face is symmetric.   MOTOR:  Right FDI, ADM, ABP, and medial forearm atrophy. He has muscle tenderness over the right scapula.   No fasciculations or abnormal movements.  No pronator drift.    Upper Extremity:  Right   Left  Deltoid  5/5    5/5   Biceps  5/5    5/5   Triceps  5/5    5/5   Wrist extensors  5/5    5/5   Wrist flexors  4/5    5/5   Finger extensors  2+/5    5/5   Finger flexors  3+/5    5/5   Dorsal interossei  1/5    5-/5   Abductor pollicis  3/5    5/5   Tone (Ashworth scale)  0   0    Lower Extremity:  Right   Left  Hip flexors  5/5    5/5   Knee flexors  5/5    5/5   Dorsiflexors  5/5    5/5   Plantarflexors  5/5    5/5   Toe extensors  5/5    5/5   Toe flexors  5/5    5/5   Tone (Ashworth scale)  0   0    MSRs:                                              Right        Left brachioradialis tr   1+  biceps tr   1+  triceps tr   1+  patellar 1+   1+  ankle jerk 1+   1+  Hoffman no   no  plantar response down   down    SENSORY:  Reduced temperature in the right hand, compared to the left.  Sensation intact in the legs.     COORDINATION/GAIT:  Gait narrow based and stable.  Stressed and tandem gait intact.   Data: Labs 02/18/2022:  vitamin B12 880, folate > 23, CRP <1.0, ESR 16, TSH 1.65, ANCA neg, ACE 33, copper  110, SPEP with IFE no M protein, cryo neg, vitamin B1 7*, ANA positive, centromere antibody 1:640*  NCS/EMG of the left leg 01/21/2024:  Normal  NCS/EMG of bilateral arms 03/24/2022 performed at [facility] Neurology: This is a complex study.  Findings are most suggestive of a  subacute demyelinating and axonal polyradiculoneuropathy affecting the upper extremities, which is worse on the right.    NCS/EMG of the right arm 01/08/2022 performed at [facility]: EMG & NCV Findings:  Impression: The above electrodiagnostic study is ABNORMAL but is difficult to fully interpret.  The needle EMG shows significant denervation but active motor unit potentials in multiple muscles which could be consistent with cervical stenosis but the MRI does not show any cervical stenosis.  This could be a brachial plexus type lesion although it does not seem to fit to a specific trauma although it is closest to an upper trunk brachial plexus lesion.  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    However the patient seems to have a concomitant median neuropathy at the wrist but also slowing of the ulnar nerve distal to the elbow and this would represent more of a demyelination type lesion on top of what was seen with the denervation potentials.     MRI cervical spine wo contrast 11/03/21: Intermittently motion degraded exam.  Indeterminate 18 mm T2 STIR hyperintense and T1 hypointense lesion within the T1 vertebral body. While this may reflect an atypical hemangioma, alternative etiologies (including osseous metastatic disease) cannot be excluded. Short-interval 2-3 month MRI follow-up without and with contrast is recommended to monitor this finding.   Marrow edema within the C6 and C7 spinous processes. Edema signal also present within the interspinous ligaments at C5-C6, C6-C7, C7-T1 and T1-T2. Findings are nonspecific, but possibly degenerative or posttraumatic in etiology. If there has been recent trauma, the interspinous ligament edema could reflect interspinous ligament injury and a cervical spine CT should be considered to exclude C6/C7 spinous process fractures. Additionally, attention to these sites recommended at MRI imaging follow-up.   Cervical spondylosis, as outlined. No more  than mild relative spinal canal narrowing. Multilevel foraminal stenosis, as detailed and greatest on the right at C3-C4 (moderate), bilaterally at C4-C5 (mild-to-moderate) and on the left at C6-C7 (mild-to-moderate).   Nonspecific straightening of the expected cervical lordosis.  CSF 05/26/2022:  R0 W1 P116*  G56   ACE 5, IgG index 0.54, MBP pending, OCB present*, cytology negative    Thank you for allowing me to participate in patient's care.  If I can answer any additional questions, I would be pleased to do so.    Sincerely,    Ilhan Debenedetto K. Tobie, DO /

## 2024-04-05 ENCOUNTER — Inpatient Hospital Stay: Attending: Hematology and Oncology

## 2024-04-05 ENCOUNTER — Inpatient Hospital Stay: Admitting: Hematology and Oncology

## 2024-04-05 VITALS — BP 147/84 | HR 92 | Temp 98.6°F | Resp 17 | Wt 282.4 lb

## 2024-04-05 DIAGNOSIS — D509 Iron deficiency anemia, unspecified: Secondary | ICD-10-CM | POA: Insufficient documentation

## 2024-04-05 DIAGNOSIS — Z800 Family history of malignant neoplasm of digestive organs: Secondary | ICD-10-CM | POA: Insufficient documentation

## 2024-04-05 LAB — CBC WITH DIFFERENTIAL/PLATELET
Abs Immature Granulocytes: 0.01 K/uL (ref 0.00–0.07)
Basophils Absolute: 0.1 K/uL (ref 0.0–0.1)
Basophils Relative: 1 %
Eosinophils Absolute: 0.5 K/uL (ref 0.0–0.5)
Eosinophils Relative: 9 %
HCT: 27 % — ABNORMAL LOW (ref 39.0–52.0)
Hemoglobin: 9 g/dL — ABNORMAL LOW (ref 13.0–17.0)
Immature Granulocytes: 0 %
Lymphocytes Relative: 31 %
Lymphs Abs: 1.7 K/uL (ref 0.7–4.0)
MCH: 18.7 pg — ABNORMAL LOW (ref 26.0–34.0)
MCHC: 33.3 g/dL (ref 30.0–36.0)
MCV: 56.1 fL — ABNORMAL LOW (ref 80.0–100.0)
Monocytes Absolute: 0.7 K/uL (ref 0.1–1.0)
Monocytes Relative: 13 %
Neutro Abs: 2.4 K/uL (ref 1.7–7.7)
Neutrophils Relative %: 46 %
Platelets: 208 K/uL (ref 150–400)
RBC: 4.81 MIL/uL (ref 4.22–5.81)
RDW: 20.1 % — ABNORMAL HIGH (ref 11.5–15.5)
WBC: 5.3 K/uL (ref 4.0–10.5)
nRBC: 0 % (ref 0.0–0.2)

## 2024-04-05 LAB — FERRITIN: Ferritin: 17 ng/mL — ABNORMAL LOW (ref 24–336)

## 2024-04-05 NOTE — Progress Notes (Signed)
 [facility] CONSULT NOTE  Patient Care Team: Tysinger, Alm RAMAN, PA-C as PCP - General (Family Medicine) Jordan, Peter M, MD as PCP - Cardiology (Cardiology) Tobie Tonita POUR, DO as Consulting Physician (Neurology) Dolphus Reiter, MD as Consulting Physician (Rheumatology) Eldonna Novel, MD as Consulting Physician (Physical Medicine and Rehabilitation) Regal, Pasco RAMAN, DPM as Consulting Physician (Podiatry) Rosalie Kitchens, MD as Consulting Physician (Gastroenterology)  CHIEF COMPLAINTS/PURPOSE OF CONSULTATION:  Anemia  ASSESSMENT & PLAN:  Assessment & Plan  Iron  deficiency anemia Chronic iron  deficiency anemia with hemoglobin 8.5-9.8 g/dL, refractory to iron  therapy. Declining ferritin suggests gastrointestinal blood loss. No clear evidence of bone marrow failure, normal WBC, platelets, retic count. Anemia stable, not acutely worsening. Declines blood transfusion due to religious beliefs. - Continued daily oral iron  supplementation. - Ordered iron  studies; will review ferritin results. - Scheduled small bowel evaluation with Dr. Rosalie in January. - Plan bone marrow biopsy if small bowel evaluation is unrevealing. - Deferred iron  infusion; reconsider based on ferritin results and preference. - Scheduled follow-up for end of January.  HISTORY OF PRESENTING ILLNESS:  Devin Erickson 53 y.o. male is here because of anemia.  Discussed the use of AI scribe software for clinical note transcription with the patient, who gave verbal consent to proceed.  History of Present Illness  Devin Erickson is a 53 year old male with chronic iron  deficiency anemia who presents for hematology follow-up regarding persistent anemia.  He has chronic iron  deficiency anemia with hemoglobin levels persistently around 9 g/dL (current 9.0, previously 9.3, 8.5, and 9.8 over the past several months). Despite receiving an iron  infusion in July 2025 and daily oral iron  supplementation, there  has been no significant or sustained improvement in hemoglobin or ferritin levels (ferritin 36 ng/mL in June prior to infusion, 30 ng/mL in October after infusion). Iron  studies from today are pending.  He denies overt gastrointestinal bleeding or changes in stool. He is scheduled for a small bowel procedure with Dr. Renold in January 2026 to evaluate for a possible gastrointestinal source of chronic blood loss. He has seen ENT, neurology, and pain specialists since his last hematology visit, but none have intervened regarding his anemia.  He experiences coldness and pallor of his hands and feet, which become pale easily, and endorses fatigue. He denies shortness of breath, chest pain, or new symptoms since his last visit.  All other systems were reviewed with the patient and are negative.  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 Kitchens Rosalie, repeat in 5 years   TONSILLECTOMY     TONSILLECTOMY AND ADENOIDECTOMY     age 4yo    SOCIAL HISTORY: Social History   Socioeconomic History   Marital status: Married    Spouse name: Not on file   Number of children: 1   Years of education: Not on file   Highest education level: 12th grade  Occupational History   Not  on file  Tobacco Use   Smoking status: Never    Passive exposure: Never   Smokeless tobacco: Never  Vaping Use   Vaping status: Never Used  Substance and Sexual Activity   Alcohol use: No    Comment: rarely   Drug use: No   Sexual activity: Not on file  Other  Topics Concern   Not on file  Social History Narrative   Married, son, [employer] - cleaning products, manufacturing, exercise - going gym 4 days per week.  Jehoviah Witness.  01/2021         Right Handed    Lives in a one story home    Occasionally caffeine    Social Drivers of Health   Financial Resource Strain: Low Risk  (06/29/2023)   Overall Financial Resource Strain (CARDIA)    Difficulty of Paying Living Expenses: Not very hard  Food Insecurity: No Food Insecurity (06/29/2023)   Hunger Vital Sign    Worried About Running Out of Food in the Last Year: Never true    Ran Out of Food in the Last Year: Never true  Transportation Needs: No Transportation Needs (06/29/2023)   PRAPARE - Administrator, Civil Service (Medical): No    Lack of Transportation (Non-Medical): No  Physical Activity: Sufficiently Active (06/29/2023)   Exercise Vital Sign    Days of Exercise per Week: 4 days    Minutes of Exercise per Session: 60 min  Stress: Stress Concern Present (06/29/2023)   [employer] of Occupational Health - Occupational Stress Questionnaire    Feeling of Stress : Rather much  Social Connections: Socially Integrated (06/29/2023)   Social Connection and Isolation Panel    Frequency of Communication with Friends and Family: More than three times a week    Frequency of Social Gatherings with Friends and Family: Once a week    Attends Religious Services: More than 4 times per year    Active Member of [employer] or Organizations: Yes    Attends Engineer, Structural: More than 4 times per year    Marital Status: Married  Catering Manager Violence: Not on file    FAMILY HISTORY: Family History  Problem Relation Age of Onset   Cancer Mother 55       colon   Hypertension Mother    Dementia Father    Cancer Father        pancreas   Healthy Brother    Healthy Son    Heart disease Neg Hx    Lung disease Neg Hx     ALLERGIES:  has no known allergies.  MEDICATIONS:   Current Outpatient Medications  Medication Sig Dispense Refill   albuterol  (VENTOLIN  HFA) 108 (90 Base) MCG/ACT inhaler Inhale 2 puffs into the lungs every 6 (six) hours as needed for wheezing or shortness of breath. 8 g 0   B Complex Vitamins (VITAMIN B COMPLEX PO) Take by mouth.     carvedilol  (COREG ) 3.125 MG tablet Take 1 tablet (3.125 mg total) by mouth 2 (two) times daily with a meal. 180 tablet 2   cetirizine (ZYRTEC) 10 MG tablet Take 10 mg by mouth daily.     ipratropium (ATROVENT ) 0.06 % nasal spray Place 2 sprays into both nostrils 4 (four) times daily. (Patient taking differently: Place 2 sprays into both nostrils 4 (four) times daily. PRN) 15 mL 2   Multiple Vitamin (MULTIVITAMIN) tablet Take 1 tablet by mouth daily.     pregabalin (LYRICA) 150 MG capsule  Take 150 mg by mouth daily. (Patient not taking: Reported on 04/04/2024)     QNASL  80 MCG/ACT AERS Place 1 spray into both nostrils 2 (two) times daily.     rosuvastatin  (CRESTOR ) 20 MG tablet TAKE 1 TABLET BY MOUTH EVERYDAY AT BEDTIME 90 tablet 2   No current facility-administered medications for this visit.     PHYSICAL EXAMINATION: ECOG PERFORMANCE STATUS: 0 - Asymptomatic  Vitals:   04/05/24 1529  BP: (!) 147/84  Pulse: 92  Resp: 17  Temp: 98.6 F (37 C)  SpO2: 98%    Filed Weights   04/05/24 1529  Weight: 282 lb 6.4 oz (128.1 kg)    He appears well, no distress.   LABORATORY DATA:  I have reviewed the data as listed Lab Results  Component Value Date   WBC 5.3 04/05/2024   HGB 9.0 (L) 04/05/2024   HCT 27.0 (L) 04/05/2024   MCV 56.1 (L) 04/05/2024   PLT 208 04/05/2024     Chemistry      Component Value Date/Time   NA 140 08/12/2023 1533   NA 139 07/07/2023 0849   K 4.0 08/12/2023 1533   CL 109 08/12/2023 1533   CO2 26 08/12/2023 1533   BUN 23 (H) 08/12/2023 1533   BUN 11 07/07/2023 0849   CREATININE 1.11 08/12/2023 1533   CREATININE 1.16 09/11/2022 1022      Component Value Date/Time    CALCIUM  9.4 08/12/2023 1533   ALKPHOS 50 08/12/2023 1533   AST 29 08/12/2023 1533   ALT 22 08/12/2023 1533   BILITOT 0.5 08/12/2023 1533       RADIOGRAPHIC STUDIES: I have personally reviewed the radiological images as listed and agreed with the findings in the report. No results found.   All questions were answered. The patient knows to call the clinic with any problems, questions or concerns. I spent 20 minutes in the care of this patient including H and P, review of records, counseling and coordination of care.   Amber Stalls, MD 04/05/2024 3:31 PM

## 2024-04-06 ENCOUNTER — Other Ambulatory Visit: Payer: Self-pay

## 2024-04-06 ENCOUNTER — Ambulatory Visit (INDEPENDENT_AMBULATORY_CARE_PROVIDER_SITE_OTHER)

## 2024-04-06 VITALS — BP 150/90 | HR 87 | Temp 98.1°F | Resp 18 | Ht 75.0 in | Wt 285.6 lb

## 2024-04-06 DIAGNOSIS — D509 Iron deficiency anemia, unspecified: Secondary | ICD-10-CM

## 2024-04-06 DIAGNOSIS — G6181 Chronic inflammatory demyelinating polyneuritis: Secondary | ICD-10-CM | POA: Diagnosis not present

## 2024-04-06 MED ORDER — EFGARTIGIMOD ALFA-HYALUR-QVFC 180-2000 MG-UNIT/ML ~~LOC~~ SOLN
1008.0000 mg | Freq: Once | SUBCUTANEOUS | Status: AC
  Administered 2024-04-06: 1008 mg via SUBCUTANEOUS
  Filled 2024-04-06: qty 5.6

## 2024-04-06 MED ORDER — LIDOCAINE HCL 2 % IJ SOLN: 20.0000 mL | Freq: Once | INTRAMUSCULAR | Status: DC

## 2024-04-06 NOTE — Progress Notes (Signed)
 Diagnosis: Myasthenia Gravis  Provider:  Mannam, Praveen MD  Procedure: Injection  Vyvgart  Hytrulo, Dose: 1008 mg, Site: subcutaneous, Number of injections: 1  Injection Site(s): Left lower quad. abdomen  Post Care: left lower quad abdominal injection  Discharge: Condition: Good, Destination: Home . AVS Declined  Performed by:  Maximiano JONELLE Pouch, LPN

## 2024-04-07 ENCOUNTER — Other Ambulatory Visit: Payer: Self-pay

## 2024-04-07 ENCOUNTER — Telehealth: Payer: Self-pay

## 2024-04-07 DIAGNOSIS — D509 Iron deficiency anemia, unspecified: Secondary | ICD-10-CM

## 2024-04-07 NOTE — Telephone Encounter (Signed)
Sent My-Chart Message to pt.

## 2024-04-08 ENCOUNTER — Ambulatory Visit: Payer: Self-pay | Admitting: Hematology and Oncology

## 2024-04-10 ENCOUNTER — Inpatient Hospital Stay

## 2024-04-10 ENCOUNTER — Encounter: Payer: Self-pay | Admitting: Neurology

## 2024-04-10 NOTE — Telephone Encounter (Signed)
-----   Message from Amber Stalls, MD sent at 04/08/2024 10:58 AM EST ----- Once again ferritin is low concerning for blood loss. He should make sure there is a FU scheduled with a GI and he told me he is going to do capsule endoscopy. I can facilitate iron  infusion if he is interested. He was not interested when I talked to him last week.

## 2024-04-13 ENCOUNTER — Ambulatory Visit

## 2024-04-14 ENCOUNTER — Telehealth: Payer: Self-pay

## 2024-04-14 NOTE — Telephone Encounter (Signed)
 Copied from CRM #[id number]. Topic: General - Other >> Apr 13, 2024 12:08 PM Rilla B wrote: Reason for CRM:  Wyvonna from [facility] Case Management, works with patient's financial risk analyst.  Phone: [phone]  They are requesting Clinical notes from last office visit on 8/25 and notes from 01/21/2024.  (They are looking for notes on providers plan of treatment).   FAX: [phone]

## 2024-04-18 ENCOUNTER — Ambulatory Visit

## 2024-04-18 VITALS — BP 146/90 | HR 87 | Temp 97.8°F | Resp 18 | Ht 75.0 in | Wt 284.2 lb

## 2024-04-18 DIAGNOSIS — G6181 Chronic inflammatory demyelinating polyneuritis: Secondary | ICD-10-CM | POA: Diagnosis not present

## 2024-04-18 MED ORDER — EFGARTIGIMOD ALFA-HYALUR-QVFC 180-2000 MG-UNIT/ML ~~LOC~~ SOLN
1008.0000 mg | Freq: Once | SUBCUTANEOUS | Status: AC
  Administered 2024-04-18: 1008 mg via SUBCUTANEOUS
  Filled 2024-04-18: qty 5.6

## 2024-04-18 NOTE — Progress Notes (Signed)
 Diagnosis:  Myasthenia Gravis  Provider:  Mannam, Praveen MD  Procedure: Injection  Vyvgart , Dose: 1008 mg, Site: subcutaneous, Number of injections: 1  Injection Site(s): Right lower quad. abdomne  Post Care: Observation period completed  Discharge: Condition: Good, Destination: Home . AVS Declined  Performed by:  Maximiano JONELLE Pouch, LPN

## 2024-04-19 ENCOUNTER — Other Ambulatory Visit: Payer: Self-pay | Admitting: Medical

## 2024-04-24 ENCOUNTER — Ambulatory Visit

## 2024-04-24 MED ORDER — EFGARTIGIMOD ALFA-HYALUR-QVFC 180-2000 MG-UNIT/ML ~~LOC~~ SOLN: 1008.0000 mg | Freq: Once | SUBCUTANEOUS | Status: DC

## 2024-04-25 ENCOUNTER — Ambulatory Visit

## 2024-04-25 VITALS — BP 156/93 | HR 78 | Temp 97.8°F | Resp 16 | Ht 75.0 in | Wt 287.0 lb

## 2024-04-25 DIAGNOSIS — G6181 Chronic inflammatory demyelinating polyneuritis: Secondary | ICD-10-CM | POA: Diagnosis not present

## 2024-04-25 MED ORDER — EFGARTIGIMOD ALFA-HYALUR-QVFC 180-2000 MG-UNIT/ML ~~LOC~~ SOLN
1008.0000 mg | Freq: Once | SUBCUTANEOUS | Status: AC
  Administered 2024-04-25: 1008 mg via SUBCUTANEOUS
  Filled 2024-04-25: qty 5.6

## 2024-04-25 NOTE — Progress Notes (Signed)
 Diagnosis: Myasthenia Gravis  Provider:  Mannam, Praveen MD  Procedure: Injection  Vyvgart Hytrulo, Dose: 1008 mg, Site: subcutaneous, Number of injections: 1  Injection Site(s): Left lower quad. abdomen  Post Care: Observation period completed  Discharge: Condition: Good, Destination: Home . AVS Declined  Performed by:  Maximiano JONELLE Pouch, LPN

## 2024-04-26 ENCOUNTER — Encounter: Payer: Self-pay | Admitting: Neurology

## 2024-04-28 ENCOUNTER — Telehealth: Payer: Self-pay

## 2024-04-28 ENCOUNTER — Other Ambulatory Visit: Payer: Self-pay

## 2024-04-28 DIAGNOSIS — D509 Iron deficiency anemia, unspecified: Secondary | ICD-10-CM

## 2024-04-28 NOTE — Telephone Encounter (Signed)
 Scheduled patient for bone marrow procedure on 2/4. Patient is to arrive at 07:45. Cytology confirmed. Moved Dr. Marshal appointment to February 11 at 15:45 per the patients request.

## 2024-04-28 NOTE — Telephone Encounter (Signed)
 Contacted patient several times to schedule a bone marrow procedure. No response via phone or MyChart. Reached out to his wife, who advised trying him again between 4:00-5:30, as he may be available then. Procedure cannot be scheduled until we speak directly with the patient to confirm a date and time that works for all parties involved.

## 2024-05-01 ENCOUNTER — Other Ambulatory Visit: Payer: Self-pay

## 2024-05-02 ENCOUNTER — Ambulatory Visit

## 2024-05-02 ENCOUNTER — Ambulatory Visit (INDEPENDENT_AMBULATORY_CARE_PROVIDER_SITE_OTHER): Admitting: Otolaryngology

## 2024-05-02 VITALS — BP 137/81 | HR 85 | Temp 97.8°F | Resp 18 | Ht 75.0 in | Wt 287.0 lb

## 2024-05-02 DIAGNOSIS — G6181 Chronic inflammatory demyelinating polyneuritis: Secondary | ICD-10-CM

## 2024-05-02 MED ORDER — EFGARTIGIMOD ALFA-HYALUR-QVFC 180-2000 MG-UNIT/ML ~~LOC~~ SOLN
1008.0000 mg | Freq: Once | SUBCUTANEOUS | Status: AC
  Administered 2024-05-02: 1008 mg via SUBCUTANEOUS
  Filled 2024-05-02: qty 5.6

## 2024-05-02 NOTE — Progress Notes (Signed)
 Diagnosis: Myasthenia Gravis  Provider:  Mannam, Praveen MD  Procedure: Injection  Vyvgart  Hytrulo, Dose: 1008mg , Site: subcutaneous, Number of injections: 1  Injection Site(s): Right upper quad. abdomen  Post Care: Observation period completed  Discharge: Condition: Good, Destination: Home . AVS Declined  Performed by:  Maximiano JONELLE Pouch, LPN

## 2024-05-09 ENCOUNTER — Ambulatory Visit

## 2024-05-09 VITALS — BP 142/91 | HR 72 | Temp 98.3°F | Resp 14 | Ht 75.0 in | Wt 285.6 lb

## 2024-05-09 DIAGNOSIS — G6181 Chronic inflammatory demyelinating polyneuritis: Secondary | ICD-10-CM

## 2024-05-09 MED ORDER — EFGARTIGIMOD ALFA-HYALUR-QVFC 180-2000 MG-UNIT/ML ~~LOC~~ SOLN
1008.0000 mg | Freq: Once | SUBCUTANEOUS | Status: AC
  Administered 2024-05-09: 1008 mg via SUBCUTANEOUS
  Filled 2024-05-09: qty 5.6

## 2024-05-09 NOTE — Progress Notes (Signed)
 Diagnosis: Myasthenia Gravis  Provider:  Mannam, Praveen MD  Procedure: Injection  Vyvgart Hytrulo, Dose: 1008 mg, Site: subcutaneous, Number of injections: 1  Injection Site(s): Left lower quad. abdomen  Post Care: Observation period completed  Discharge: Condition: Good, Destination: Home . AVS Declined  Performed by:  Maximiano JONELLE Pouch, LPN

## 2024-05-09 NOTE — Progress Notes (Unsigned)
 "  Office Visit Note  Patient: Devin Erickson             Date of Birth: 08/14/1970           MRN: [id number]             PCP: Bulah Alm RAMAN, PA-C Referring: Bulah Alm RAMAN, PA-C Visit Date: 05/23/2024 Occupation: Data Unavailable  Subjective:  No chief complaint on file.   History of Present Illness: Devin Erickson is a 54 y.o. male ***     Activities of Daily Living:  Patient reports morning stiffness for *** {minute/hour:19697}.   Patient {ACTIONS;DENIES/REPORTS:21021675::Denies} nocturnal pain.  Difficulty dressing/grooming: {ACTIONS;DENIES/REPORTS:21021675::Denies} Difficulty climbing stairs: {ACTIONS;DENIES/REPORTS:21021675::Denies} Difficulty getting out of chair: {ACTIONS;DENIES/REPORTS:21021675::Denies} Difficulty using hands for taps, buttons, cutlery, and/or writing: {ACTIONS;DENIES/REPORTS:21021675::Denies}  No Rheumatology ROS completed.   PMFS History:  Patient Active Problem List   Diagnosis Date Noted   Family history of malignant neoplasm of gastrointestinal tract 02/04/2024   Colon polyps 02/04/2024   Type II diabetes mellitus with hyperosmolarity, uncontrolled (HCC) 02/04/2024   Gastroesophageal reflux disease 02/04/2024   History of colonic polyps 02/04/2024   Thalassemia minor 02/04/2024   IDA (iron  deficiency anemia) 10/20/2023   Aneurysm of ascending aorta without rupture 06/30/2023   Essential hypertension, benign 06/30/2023   Limited systemic sclerosis (HCC) 05/19/2023   Sjogren's disease 05/19/2023   DDD (degenerative disc disease), cervical 05/19/2023   Nosebleed 04/06/2023   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   Neuropathy, arm, right 01/20/2022   Radiculopathy, cervical region 10/15/2021   Left arm pain 06/18/2021   Upper back pain 06/18/2021   Decreased ROM of neck 06/18/2021   Screening for heart disease 02/05/2021   Screening for prostate cancer 02/05/2021   Sleep  disturbance 02/05/2021   Chronic right-sided thoracic back pain 05/08/2020   Fatigue 07/10/2019   Raynaud's phenomenon without gangrene 07/10/2019   Erectile dysfunction 07/10/2019   At high risk for bleeding 07/10/2019   ANA positive 07/10/2019   Elevated liver function tests 03/08/2019   Need for influenza vaccination 02/16/2019   Vaccine counseling 02/16/2019   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   Chronic left shoulder pain 05/02/2018   Benign paroxysmal positional vertigo due to bilateral vestibular disorder 06/24/2017   Encounter for health maintenance examination in adult 07/22/2016   Chronic pain of right knee 07/22/2016   Atopic dermatitis 12/12/2015   Tension headache, chronic 10/04/2015   Cough variant asthma vs UACS 09/04/2015   Epistaxis 08/01/2014   Rhinitis, allergic 08/01/2014   Hyperlipidemia 08/01/2014   OSA (obstructive sleep apnea) 08/01/2014    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   History of sleep apnea 04/2012   per overnight screen/oximetry    Obesity    Thalassemia minor    Wears glasses     Family History  Problem Relation Age of Onset   Cancer Mother 11       colon   Hypertension Mother    Dementia Father    Cancer Father        pancreas   Healthy Brother    Healthy Son    Heart disease Neg Hx    Lung disease Neg Hx    Past Surgical History:  Procedure Laterality Date   COLONOSCOPY  04/27/2010   [facility], normal per pt   COLONOSCOPY  08/2019   Dr. Oliva Boots, repeat in 5 years   TONSILLECTOMY     TONSILLECTOMY AND ADENOIDECTOMY     age 24yo   Social History[1] Social History   Social History Narrative   Married, son, Ether - designer, television/film set, set designer, exercise - going gym 4 days per week.  [employer] Witness.  01/2021         Right Handed     Lives in a one story home    Occasionally caffeine      Immunization History  Administered Date(s) Administered    sv, Bivalent, Protein Subunit Rsvpref,pf (Abrysvo) 11/30/2023   Fluzone Influenza virus vaccine,trivalent (IIV3), split virus 03/04/2010, 02/15/2012   Influenza Split 02/07/2009   Influenza, Mdck, Trivalent,PF 6+ MOS(egg free) 01/12/2023, 11/30/2023   Influenza,inj,Quad PF,6+ Mos 02/15/2012, 03/10/2013, 06/14/2014, 02/01/2015, 02/08/2017, 03/10/2018, 02/16/2019, 04/05/2020, 01/06/2021, 01/29/2022   Influenza-Unspecified 05/15/2016   Moderna Sars-Covid-2 Vaccination 07/13/2019, 08/10/2019   PFIZER Comirnaty(Gray Top)Covid-19 Tri-Sucrose Vaccine 08/23/2020   PFIZER(Purple Top)SARS-COV-2 Vaccination 03/02/2020   Pfizer Covid-19 Vaccine Bivalent Booster 3yrs & up 01/06/2021   Pfizer(Comirnaty)Fall Seasonal Vaccine 12 years and older 11/30/2023   Tdap 02/15/2012, 08/10/2012   Unspecified SARS-COV-2 Vaccination 01/29/2022   Zoster Recombinant(Shingrix) 06/08/2020, 08/07/2020     Objective: Vital Signs: There were no vitals taken for this visit.   Physical Exam   Musculoskeletal Exam: ***  CDAI Exam: CDAI Score: -- Patient Global: --; Provider Global: -- Swollen: --; Tender: -- Joint Exam 05/23/2024   No joint exam has been documented for this visit   There is currently no information documented on the homunculus. Go to the Rheumatology activity and complete the homunculus joint exam.  Investigation: No additional findings.  Imaging: No results found.  Recent Labs: Lab Results  Component Value Date   WBC 5.3 04/05/2024   HGB 9.0 (L) 04/05/2024   PLT 208 04/05/2024   NA 140 08/12/2023   K 4.0 08/12/2023   CL 109 08/12/2023   CO2 26 08/12/2023   GLUCOSE 95 08/12/2023   BUN 23 (H) 08/12/2023   CREATININE 1.11 08/12/2023   BILITOT 0.5 08/12/2023   ALKPHOS 50 08/12/2023   AST 29 08/12/2023   ALT 22 08/12/2023   PROT 8.0 08/12/2023   ALBUMIN 4.3  08/12/2023   CALCIUM  9.4 08/12/2023   GFRAA 84 02/16/2019    Speciality Comments: No specialty comments available.  Procedures:  No procedures performed Allergies: Patient has no known allergies.   Assessment / Plan:     Visit Diagnoses: No diagnosis found.  Orders: No orders of the defined types were placed in this encounter.  No orders of the defined types were placed in this encounter.   Face-to-face time spent with patient was *** minutes. Greater than 50% of time was spent in counseling and coordination of care.  Follow-Up Instructions: No follow-ups on file.   Daved JAYSON Gavel, CMA  Note - This record has been created using Animal nutritionist.  Chart creation errors have been sought, but may not always  have been located. Such creation errors do not reflect on  the standard of medical care.    [1]  Social History Tobacco Use   Smoking status: Never    Passive exposure: Never   Smokeless tobacco: Never  Vaping Use   Vaping status: Never Used  Substance Use Topics   Alcohol use: No    Comment: rarely   Drug use: No   "

## 2024-05-16 ENCOUNTER — Ambulatory Visit

## 2024-05-16 VITALS — BP 158/89 | HR 77 | Temp 97.8°F | Resp 20 | Ht 75.0 in | Wt 286.4 lb

## 2024-05-16 DIAGNOSIS — G6181 Chronic inflammatory demyelinating polyneuritis: Secondary | ICD-10-CM

## 2024-05-16 MED ORDER — EFGARTIGIMOD ALFA-HYALUR-QVFC 180-2000 MG-UNIT/ML ~~LOC~~ SOLN
1008.0000 mg | Freq: Once | SUBCUTANEOUS | Status: AC
  Administered 2024-05-16: 1008 mg via SUBCUTANEOUS
  Filled 2024-05-16: qty 5.6

## 2024-05-16 NOTE — Progress Notes (Signed)
 Diagnosis: CIDP  Provider:  Mannam, Praveen MD  Procedure: Injection  Vyvgart  Hytrulo , Dose: 1008mg , Site: subcutaneous, Number of injections: 1  Injection Site(s): Left lower quad. abdomen  Post Care: Observation period completed and Subcutaneous infusion needle discontinued  Discharge: Condition: Good, Destination: Home . AVS Declined  Performed by:  Leita FORBES Miles, LPN

## 2024-05-17 ENCOUNTER — Inpatient Hospital Stay

## 2024-05-18 ENCOUNTER — Telehealth: Payer: Self-pay | Admitting: Pharmacy Technician

## 2024-05-18 NOTE — Telephone Encounter (Signed)
 Auth Submission: APPROVED Site of care: Site of care: [facility] Payer: [employer] Medication & CPT/J Code(s) submitted: VYVGART  HYTRULLO [id number] Diagnosis Code: G61.81 Route of submission (phone, fax, portal):  Phone # Fax # Auth type: Buy/Bill PB Units/visits requested: ONCE WEEKLY Reference number: [id number] Approval from: 05/18/24 to 05/17/25

## 2024-05-23 ENCOUNTER — Ambulatory Visit: Admitting: Rheumatology

## 2024-05-23 DIAGNOSIS — E782 Mixed hyperlipidemia: Secondary | ICD-10-CM

## 2024-05-23 DIAGNOSIS — J309 Allergic rhinitis, unspecified: Secondary | ICD-10-CM

## 2024-05-23 DIAGNOSIS — G6181 Chronic inflammatory demyelinating polyneuritis: Secondary | ICD-10-CM

## 2024-05-23 DIAGNOSIS — L2089 Other atopic dermatitis: Secondary | ICD-10-CM

## 2024-05-23 DIAGNOSIS — J45991 Cough variant asthma: Secondary | ICD-10-CM

## 2024-05-23 DIAGNOSIS — L630 Alopecia (capitis) totalis: Secondary | ICD-10-CM

## 2024-05-23 DIAGNOSIS — D508 Other iron deficiency anemias: Secondary | ICD-10-CM

## 2024-05-23 DIAGNOSIS — M3509 Sicca syndrome with other organ involvement: Secondary | ICD-10-CM

## 2024-05-23 DIAGNOSIS — I7300 Raynaud's syndrome without gangrene: Secondary | ICD-10-CM

## 2024-05-23 DIAGNOSIS — M349 Systemic sclerosis, unspecified: Secondary | ICD-10-CM

## 2024-05-23 DIAGNOSIS — R748 Abnormal levels of other serum enzymes: Secondary | ICD-10-CM

## 2024-05-23 DIAGNOSIS — M5030 Other cervical disc degeneration, unspecified cervical region: Secondary | ICD-10-CM

## 2024-05-24 ENCOUNTER — Inpatient Hospital Stay: Admitting: Hematology and Oncology

## 2024-05-24 ENCOUNTER — Ambulatory Visit

## 2024-05-24 MED ORDER — EFGARTIGIMOD ALFA-HYALUR-QVFC 180-2000 MG-UNIT/ML ~~LOC~~ SOLN: 1008.0000 mg | Freq: Once | SUBCUTANEOUS | Status: DC

## 2024-05-25 ENCOUNTER — Encounter: Payer: Self-pay | Admitting: Genetic Counselor

## 2024-05-29 ENCOUNTER — Inpatient Hospital Stay: Attending: Hematology and Oncology

## 2024-05-29 ENCOUNTER — Inpatient Hospital Stay

## 2024-05-29 DIAGNOSIS — Z800 Family history of malignant neoplasm of digestive organs: Secondary | ICD-10-CM | POA: Insufficient documentation

## 2024-05-29 DIAGNOSIS — Z803 Family history of malignant neoplasm of breast: Secondary | ICD-10-CM | POA: Insufficient documentation

## 2024-05-30 ENCOUNTER — Ambulatory Visit (INDEPENDENT_AMBULATORY_CARE_PROVIDER_SITE_OTHER): Admitting: Otolaryngology

## 2024-05-30 NOTE — Progress Notes (Unsigned)
INDICATION: refractory anemia  Brief examination was performed. ENT: adequate airway clearance Heart: regular rate and rhythm.No Murmurs Lungs: clear to auscultation, no wheezes, normal respiratory effort  Bone Marrow Biopsy and Aspiration Procedure Note   Informed consent was obtained and potential risks including bleeding, infection and pain were reviewed with the patient.  The patient's name, date of birth, identification, consent and allergies were verified prior to the start of procedure and time out was performed.  The left posterior iliac crest was chosen as the site of biopsy.  The skin was prepped with ChloraPrep.   15 cc of 2% lidocaine was used to provide local anaesthesia.   10 cc of bone marrow aspirate was obtained followed by 1cm biopsy.  Pressure was applied to the biopsy site and bandage was placed over the biopsy site. Patient was made to lie on the back for 30 mins prior to discharge.  The procedure was tolerated well. COMPLICATIONS: None BLOOD LOSS: none The patient was discharged home in stable condition with a 1 week follow up to review results.  Patient was provided with post bone marrow biopsy instructions and instructed to call if there was any bleeding or worsening pain.  Specimens sent for flow cytometry, cytogenetics and additional studies.  Signed Scot Dock, NP

## 2024-05-31 ENCOUNTER — Ambulatory Visit

## 2024-05-31 ENCOUNTER — Inpatient Hospital Stay

## 2024-05-31 VITALS — BP 144/98 | HR 75 | Temp 97.8°F | Resp 20

## 2024-05-31 DIAGNOSIS — D464 Refractory anemia, unspecified: Secondary | ICD-10-CM

## 2024-05-31 DIAGNOSIS — Z800 Family history of malignant neoplasm of digestive organs: Secondary | ICD-10-CM

## 2024-05-31 DIAGNOSIS — Z803 Family history of malignant neoplasm of breast: Secondary | ICD-10-CM

## 2024-05-31 DIAGNOSIS — D509 Iron deficiency anemia, unspecified: Secondary | ICD-10-CM

## 2024-05-31 LAB — CBC WITH DIFFERENTIAL/PLATELET
Abs Immature Granulocytes: 0.01 10*3/uL (ref 0.00–0.07)
Basophils Absolute: 0 10*3/uL (ref 0.0–0.1)
Basophils Relative: 1 %
Eosinophils Absolute: 0.3 10*3/uL (ref 0.0–0.5)
Eosinophils Relative: 7 %
HCT: 30.8 % — ABNORMAL LOW (ref 39.0–52.0)
Hemoglobin: 10.3 g/dL — ABNORMAL LOW (ref 13.0–17.0)
Immature Granulocytes: 0 %
Lymphocytes Relative: 30 %
Lymphs Abs: 1.3 10*3/uL (ref 0.7–4.0)
MCH: 18.9 pg — ABNORMAL LOW (ref 26.0–34.0)
MCHC: 33.4 g/dL (ref 30.0–36.0)
MCV: 56.4 fL — ABNORMAL LOW (ref 80.0–100.0)
Monocytes Absolute: 0.6 10*3/uL (ref 0.1–1.0)
Monocytes Relative: 14 %
Neutro Abs: 2.1 10*3/uL (ref 1.7–7.7)
Neutrophils Relative %: 48 %
Platelets: 206 10*3/uL (ref 150–400)
RBC: 5.46 MIL/uL (ref 4.22–5.81)
RDW: 25.9 % — ABNORMAL HIGH (ref 11.5–15.5)
WBC: 4.4 10*3/uL (ref 4.0–10.5)
nRBC: 0 % (ref 0.0–0.2)

## 2024-05-31 LAB — GENETIC SCREENING ORDER

## 2024-05-31 MED ORDER — LIDOCAINE HCL 2 % IJ SOLN
20.0000 mL | Freq: Once | INTRAMUSCULAR | Status: AC
  Administered 2024-05-31: 400 mg via INTRADERMAL
  Filled 2024-05-31: qty 20

## 2024-05-31 NOTE — Patient Instructions (Signed)
 Removing Bone Marrow for Testing (Bone Marrow Aspiration and Bone Marrow Biopsy) in Adults: What to Know After The following information offers guidance on how to care for yourself after your procedure. Your health care provider may also give you more specific instructions. If you have problems or questions, contact your health care provider. What can I expect after the procedure? After the procedure, it is common to have: Mild pain and tenderness. Swelling. Bruising. Follow these instructions at home: Incision care  Follow instructions from your health care provider about how to take care of the incision site. Make sure you: Wash your hands with soap and water for at least 20 seconds before and after you change your bandage (dressing). If soap and water are not available, use hand sanitizer. Change your dressing as told by your health care provider. Leave stitches (sutures), skin glue, or adhesive strips in place. These skin closures may need to stay in place for 2 weeks or longer. If adhesive strip edges start to loosen and curl up, you may trim the loose edges. Do not remove adhesive strips completely unless your health care provider tells you to do that. Check your incision site every day for signs of infection. Check for: More redness, swelling, or pain. Fluid or blood. Warmth. Pus or a bad smell. Activity Return to your normal activities as told by your health care provider. Ask your health care provider what activities are safe for you. Do not lift anything that is heavier than 10 lb (4.5 kg), or the limit that you are told, until your health care provider says that it is safe. If you were given a sedative during the procedure, it can affect you for several hours. Do not drive or operate machinery until your health care provider says that it is safe. General instructions  Take over-the-counter and prescription medicines only as told by your health care provider. Do not take baths,  swim, or use a hot tub until your health care provider approves. Ask your health care provider if you may take showers. You may only be allowed to take sponge baths. If directed, put ice on the affected area. To do this: Put ice in a plastic bag. Place a towel between your skin and the bag. Leave the ice on for 20 minutes, 2-3 times a day. If your skin turns bright red, remove the ice right away to prevent skin damage. The risk of skin damage is higher if you cannot feel pain, heat, or cold. Contact a health care provider if: You have signs of infection. Your pain is not controlled with medicine. You have cancer, and a temperature of 100.29F (38C) or higher. Get help right away if: You have a temperature of 101F (38.3C) or higher, or as told by your health care provider. You have bleeding from the incision site that cannot be controlled. This information is not intended to replace advice given to you by your health care provider. Make sure you discuss any questions you have with your health care provider. Document Revised: 02/17/2024 Document Reviewed: 08/18/2021 Elsevier Patient Education  2025 [employer].

## 2024-05-31 NOTE — Progress Notes (Signed)
Pt observed for 30 minutes post BMBX.  Pt tolerated procedure well w/out incident. VSS at discharge.  Ambulatory to lobby.

## 2024-06-02 ENCOUNTER — Ambulatory Visit

## 2024-06-02 VITALS — BP 136/98 | HR 94 | Temp 97.6°F | Resp 16 | Ht 75.0 in | Wt 290.0 lb

## 2024-06-02 DIAGNOSIS — G6181 Chronic inflammatory demyelinating polyneuritis: Secondary | ICD-10-CM

## 2024-06-02 LAB — SURGICAL PATHOLOGY

## 2024-06-02 MED ORDER — EFGARTIGIMOD ALFA-HYALUR-QVFC 180-2000 MG-UNIT/ML ~~LOC~~ SOLN
1008.0000 mg | Freq: Once | SUBCUTANEOUS | Status: AC
  Administered 2024-06-02: 1008 mg via SUBCUTANEOUS
  Filled 2024-06-02: qty 5.6

## 2024-06-02 NOTE — Progress Notes (Signed)
 Diagnosis: CIDP  Provider:  Mannam, Praveen MD  Procedure: Injection  Vyvgart  Hytrulo , Dose: 1008 mg, Site: subcutaneous, Number of injections: 1  Injection Site(s): Right lower quad. abdomne  Post Care: Observation period completed and Subcutaneous infusion needle discontinued  Discharge: Condition: Good, Destination: Home . AVS Declined  Performed by:  Maximiano Pouch, LPN

## 2024-06-07 ENCOUNTER — Inpatient Hospital Stay: Admitting: Hematology and Oncology

## 2024-06-09 ENCOUNTER — Ambulatory Visit

## 2024-06-14 ENCOUNTER — Ambulatory Visit: Admitting: Medical

## 2024-06-16 ENCOUNTER — Ambulatory Visit

## 2024-06-23 ENCOUNTER — Ambulatory Visit

## 2024-06-26 ENCOUNTER — Ambulatory Visit (INDEPENDENT_AMBULATORY_CARE_PROVIDER_SITE_OTHER): Admitting: Otolaryngology

## 2024-06-30 ENCOUNTER — Ambulatory Visit

## 2024-07-07 ENCOUNTER — Ambulatory Visit

## 2024-07-14 ENCOUNTER — Ambulatory Visit

## 2024-07-21 ENCOUNTER — Ambulatory Visit

## 2024-08-22 ENCOUNTER — Ambulatory Visit: Admitting: Neurology

## 2024-08-23 ENCOUNTER — Encounter: Admitting: Medical
